# Patient Record
Sex: Female | Born: 1955 | Race: Black or African American | Hispanic: No | Smoking: Never smoker
Health system: Southern US, Community
[De-identification: ages and names within clinical notes are randomized; demographics above are authoritative.]

## PROBLEM LIST (undated history)

## (undated) DIAGNOSIS — E785 Hyperlipidemia, unspecified: Secondary | ICD-10-CM

## (undated) DIAGNOSIS — R51 Headache: Secondary | ICD-10-CM

## (undated) DIAGNOSIS — Z9989 Dependence on other enabling machines and devices: Secondary | ICD-10-CM

## (undated) DIAGNOSIS — R519 Headache, unspecified: Secondary | ICD-10-CM

## (undated) DIAGNOSIS — G4730 Sleep apnea, unspecified: Secondary | ICD-10-CM

## (undated) DIAGNOSIS — G4733 Obstructive sleep apnea (adult) (pediatric): Secondary | ICD-10-CM

## (undated) DIAGNOSIS — D473 Essential (hemorrhagic) thrombocythemia: Secondary | ICD-10-CM

## (undated) DIAGNOSIS — H409 Unspecified glaucoma: Secondary | ICD-10-CM

## (undated) DIAGNOSIS — I10 Essential (primary) hypertension: Secondary | ICD-10-CM

## (undated) DIAGNOSIS — R748 Abnormal levels of other serum enzymes: Secondary | ICD-10-CM

## (undated) DIAGNOSIS — K219 Gastro-esophageal reflux disease without esophagitis: Secondary | ICD-10-CM

## (undated) HISTORY — PX: ABDOMINAL HYSTERECTOMY: SHX81

## (undated) HISTORY — DX: Gastro-esophageal reflux disease without esophagitis: K21.9

## (undated) HISTORY — DX: Hyperlipidemia, unspecified: E78.5

## (undated) HISTORY — DX: Essential (hemorrhagic) thrombocythemia: D47.3

## (undated) HISTORY — DX: Sleep apnea, unspecified: G47.30

## (undated) HISTORY — DX: Abnormal levels of other serum enzymes: R74.8

## (undated) HISTORY — DX: Unspecified glaucoma: H40.9

## (undated) HISTORY — DX: Essential (primary) hypertension: I10

---

## 2000-12-12 ENCOUNTER — Other Ambulatory Visit: Admission: RE | Admit: 2000-12-12 | Discharge: 2000-12-12 | Payer: Self-pay | Admitting: Family Medicine

## 2001-08-28 ENCOUNTER — Encounter: Payer: Self-pay | Admitting: Family Medicine

## 2001-08-28 ENCOUNTER — Ambulatory Visit (HOSPITAL_COMMUNITY): Admission: RE | Admit: 2001-08-28 | Discharge: 2001-08-28 | Payer: Self-pay | Admitting: *Deleted

## 2001-11-06 ENCOUNTER — Other Ambulatory Visit: Admission: RE | Admit: 2001-11-06 | Discharge: 2001-11-06 | Payer: Self-pay | Admitting: General Surgery

## 2002-02-06 ENCOUNTER — Encounter: Payer: Self-pay | Admitting: Family Medicine

## 2002-02-06 ENCOUNTER — Ambulatory Visit (HOSPITAL_COMMUNITY): Admission: RE | Admit: 2002-02-06 | Discharge: 2002-02-06 | Payer: Self-pay | Admitting: Family Medicine

## 2002-09-03 ENCOUNTER — Ambulatory Visit (HOSPITAL_COMMUNITY): Admission: RE | Admit: 2002-09-03 | Discharge: 2002-09-03 | Payer: Self-pay | Admitting: Family Medicine

## 2002-09-03 ENCOUNTER — Encounter: Payer: Self-pay | Admitting: Family Medicine

## 2003-10-08 ENCOUNTER — Ambulatory Visit (HOSPITAL_COMMUNITY): Admission: RE | Admit: 2003-10-08 | Discharge: 2003-10-08 | Payer: Self-pay | Admitting: Family Medicine

## 2003-11-09 ENCOUNTER — Ambulatory Visit (HOSPITAL_COMMUNITY): Admission: RE | Admit: 2003-11-09 | Discharge: 2003-11-09 | Payer: Self-pay | Admitting: Family Medicine

## 2004-05-19 ENCOUNTER — Ambulatory Visit: Payer: Self-pay | Admitting: Family Medicine

## 2004-11-16 ENCOUNTER — Ambulatory Visit (HOSPITAL_COMMUNITY): Admission: RE | Admit: 2004-11-16 | Discharge: 2004-11-16 | Payer: Self-pay | Admitting: Family Medicine

## 2004-12-14 ENCOUNTER — Ambulatory Visit (HOSPITAL_COMMUNITY): Admission: RE | Admit: 2004-12-14 | Discharge: 2004-12-14 | Payer: Self-pay | Admitting: Family Medicine

## 2005-02-27 ENCOUNTER — Ambulatory Visit: Payer: Self-pay | Admitting: Family Medicine

## 2005-02-28 ENCOUNTER — Encounter: Payer: Self-pay | Admitting: Family Medicine

## 2005-02-28 ENCOUNTER — Ambulatory Visit (HOSPITAL_COMMUNITY): Admission: RE | Admit: 2005-02-28 | Discharge: 2005-02-28 | Payer: Self-pay | Admitting: Family Medicine

## 2005-03-06 ENCOUNTER — Ambulatory Visit: Payer: Self-pay | Admitting: Orthopedic Surgery

## 2005-03-08 ENCOUNTER — Ambulatory Visit (HOSPITAL_COMMUNITY): Admission: RE | Admit: 2005-03-08 | Discharge: 2005-03-08 | Payer: Self-pay | Admitting: Orthopedic Surgery

## 2005-03-08 ENCOUNTER — Encounter: Payer: Self-pay | Admitting: Family Medicine

## 2005-03-23 ENCOUNTER — Ambulatory Visit: Payer: Self-pay | Admitting: Orthopedic Surgery

## 2005-03-31 ENCOUNTER — Encounter (HOSPITAL_COMMUNITY): Admission: RE | Admit: 2005-03-31 | Discharge: 2005-04-30 | Payer: Self-pay | Admitting: Orthopedic Surgery

## 2005-06-02 ENCOUNTER — Ambulatory Visit: Payer: Self-pay | Admitting: Family Medicine

## 2005-08-24 ENCOUNTER — Ambulatory Visit: Payer: Self-pay | Admitting: Family Medicine

## 2005-12-25 ENCOUNTER — Ambulatory Visit (HOSPITAL_COMMUNITY): Admission: RE | Admit: 2005-12-25 | Discharge: 2005-12-25 | Payer: Self-pay | Admitting: Family Medicine

## 2006-02-27 ENCOUNTER — Ambulatory Visit: Payer: Self-pay | Admitting: Family Medicine

## 2006-03-15 ENCOUNTER — Other Ambulatory Visit: Admission: RE | Admit: 2006-03-15 | Discharge: 2006-03-15 | Payer: Self-pay | Admitting: Family Medicine

## 2006-03-15 ENCOUNTER — Ambulatory Visit: Payer: Self-pay | Admitting: Family Medicine

## 2006-03-15 ENCOUNTER — Encounter: Payer: Self-pay | Admitting: Family Medicine

## 2006-08-15 ENCOUNTER — Ambulatory Visit: Payer: Self-pay | Admitting: Family Medicine

## 2006-09-24 ENCOUNTER — Ambulatory Visit (HOSPITAL_COMMUNITY): Admission: RE | Admit: 2006-09-24 | Discharge: 2006-09-24 | Payer: Self-pay | Admitting: Family Medicine

## 2006-10-04 ENCOUNTER — Ambulatory Visit (HOSPITAL_COMMUNITY): Admission: RE | Admit: 2006-10-04 | Discharge: 2006-10-04 | Payer: Self-pay | Admitting: Gastroenterology

## 2006-10-04 ENCOUNTER — Ambulatory Visit: Payer: Self-pay | Admitting: Gastroenterology

## 2006-10-04 HISTORY — PX: COLONOSCOPY: SHX174

## 2006-10-09 ENCOUNTER — Encounter: Payer: Self-pay | Admitting: Family Medicine

## 2006-10-09 LAB — CONVERTED CEMR LAB
ALT: 14 units/L (ref 0–35)
AST: 19 units/L (ref 0–37)
Albumin: 4.5 g/dL (ref 3.5–5.2)
Alkaline Phosphatase: 142 units/L — ABNORMAL HIGH (ref 39–117)
CO2: 29 meq/L (ref 19–32)
Calcium: 10.1 mg/dL (ref 8.4–10.5)
Chloride: 104 meq/L (ref 96–112)
Cholesterol: 146 mg/dL (ref 0–200)
Creatinine, Ser: 0.79 mg/dL (ref 0.40–1.20)
Glucose, Bld: 89 mg/dL (ref 70–99)
HDL: 39 mg/dL — ABNORMAL LOW (ref >39)
Potassium: 4 meq/L (ref 3.5–5.3)
Sodium: 143 meq/L (ref 135–145)
Total CHOL/HDL Ratio: 3.7
Total Protein: 7.7 g/dL (ref 6.0–8.3)
Triglycerides: 67 mg/dL (ref <150)

## 2006-10-25 ENCOUNTER — Ambulatory Visit: Payer: Self-pay | Admitting: Family Medicine

## 2006-10-26 ENCOUNTER — Encounter: Payer: Self-pay | Admitting: Family Medicine

## 2006-10-26 LAB — CONVERTED CEMR LAB: Candida species: NEGATIVE

## 2006-10-27 ENCOUNTER — Encounter: Payer: Self-pay | Admitting: Family Medicine

## 2007-01-11 ENCOUNTER — Ambulatory Visit (HOSPITAL_COMMUNITY): Admission: RE | Admit: 2007-01-11 | Discharge: 2007-01-11 | Payer: Self-pay | Admitting: Family Medicine

## 2007-06-05 ENCOUNTER — Other Ambulatory Visit: Admission: RE | Admit: 2007-06-05 | Discharge: 2007-06-05 | Payer: Self-pay | Admitting: Family Medicine

## 2007-06-05 ENCOUNTER — Ambulatory Visit: Payer: Self-pay | Admitting: Family Medicine

## 2007-06-05 ENCOUNTER — Encounter: Payer: Self-pay | Admitting: Family Medicine

## 2007-06-05 LAB — CONVERTED CEMR LAB: Pap Smear: NORMAL

## 2007-06-14 ENCOUNTER — Encounter: Payer: Self-pay | Admitting: Family Medicine

## 2007-06-14 LAB — CONVERTED CEMR LAB
ALT: 9 units/L (ref 0–35)
AST: 12 units/L (ref 0–37)
Albumin: 4.4 g/dL (ref 3.5–5.2)
Bilirubin, Direct: 0.1 mg/dL (ref 0.0–0.3)
Chloride: 102 meq/L (ref 96–112)
Glucose, Bld: 92 mg/dL (ref 70–99)
Indirect Bilirubin: 0.5 mg/dL (ref 0.0–0.9)
LDL Cholesterol: 85 mg/dL (ref 0–99)
Potassium: 3.9 meq/L (ref 3.5–5.3)
Total Bilirubin: 0.6 mg/dL (ref 0.3–1.2)
Total Protein: 7.6 g/dL (ref 6.0–8.3)
VLDL: 19 mg/dL (ref 0–40)

## 2007-11-12 ENCOUNTER — Ambulatory Visit: Payer: Self-pay | Admitting: Family Medicine

## 2007-11-12 DIAGNOSIS — E785 Hyperlipidemia, unspecified: Secondary | ICD-10-CM | POA: Insufficient documentation

## 2007-11-12 DIAGNOSIS — I10 Essential (primary) hypertension: Secondary | ICD-10-CM | POA: Insufficient documentation

## 2008-01-24 ENCOUNTER — Ambulatory Visit (HOSPITAL_COMMUNITY): Admission: RE | Admit: 2008-01-24 | Discharge: 2008-01-24 | Payer: Self-pay | Admitting: Family Medicine

## 2008-05-04 ENCOUNTER — Encounter: Payer: Self-pay | Admitting: Family Medicine

## 2008-06-10 ENCOUNTER — Ambulatory Visit: Payer: Self-pay | Admitting: Family Medicine

## 2008-06-10 ENCOUNTER — Encounter: Payer: Self-pay | Admitting: Family Medicine

## 2008-06-10 ENCOUNTER — Other Ambulatory Visit: Admission: RE | Admit: 2008-06-10 | Discharge: 2008-06-10 | Payer: Self-pay | Admitting: Family Medicine

## 2008-06-10 DIAGNOSIS — R5381 Other malaise: Secondary | ICD-10-CM | POA: Insufficient documentation

## 2008-06-10 DIAGNOSIS — R5383 Other fatigue: Secondary | ICD-10-CM

## 2008-06-11 ENCOUNTER — Encounter: Payer: Self-pay | Admitting: Family Medicine

## 2008-06-11 LAB — CONVERTED CEMR LAB
ALT: 10 units/L (ref 0–35)
AST: 14 units/L (ref 0–37)
Alkaline Phosphatase: 135 units/L — ABNORMAL HIGH (ref 39–117)
BUN: 14 mg/dL (ref 6–23)
Basophils Relative: 0 % (ref 0–1)
Eosinophils Absolute: 0.1 10*3/uL (ref 0.0–0.7)
Hemoglobin: 11.9 g/dL — ABNORMAL LOW (ref 12.0–15.0)
Indirect Bilirubin: 0.5 mg/dL (ref 0.0–0.9)
LDL Cholesterol: 89 mg/dL (ref 0–99)
MCHC: 32.8 g/dL (ref 30.0–36.0)
MCV: 87.3 fL (ref 78.0–100.0)
Monocytes Relative: 7 % (ref 3–12)
Neutro Abs: 6.4 10*3/uL (ref 1.7–7.7)
Platelets: 401 10*3/uL — ABNORMAL HIGH (ref 150–400)
Potassium: 3.8 meq/L (ref 3.5–5.3)
RBC: 4.16 M/uL (ref 3.87–5.11)
Sodium: 143 meq/L (ref 135–145)
TSH: 1.36 microintl units/mL (ref 0.350–4.50)
Total Bilirubin: 0.7 mg/dL (ref 0.3–1.2)
Total CHOL/HDL Ratio: 3.6
Total Protein: 7.9 g/dL (ref 6.0–8.3)
WBC: 9.9 10*3/uL (ref 4.0–10.5)

## 2008-11-20 ENCOUNTER — Ambulatory Visit: Payer: Self-pay | Admitting: Family Medicine

## 2008-11-20 DIAGNOSIS — R209 Unspecified disturbances of skin sensation: Secondary | ICD-10-CM | POA: Insufficient documentation

## 2008-11-20 DIAGNOSIS — M25519 Pain in unspecified shoulder: Secondary | ICD-10-CM | POA: Insufficient documentation

## 2008-11-20 DIAGNOSIS — R51 Headache: Secondary | ICD-10-CM | POA: Insufficient documentation

## 2008-11-20 DIAGNOSIS — R519 Headache, unspecified: Secondary | ICD-10-CM | POA: Insufficient documentation

## 2009-01-11 ENCOUNTER — Encounter: Payer: Self-pay | Admitting: Family Medicine

## 2009-01-14 LAB — CONVERTED CEMR LAB
AST: 16 units/L (ref 0–37)
Alkaline Phosphatase: 134 units/L — ABNORMAL HIGH (ref 39–117)
Bilirubin, Direct: 0.1 mg/dL (ref 0.0–0.3)
CO2: 28 meq/L (ref 19–32)
Calcium: 9.8 mg/dL (ref 8.4–10.5)
Chloride: 102 meq/L (ref 96–112)
Cholesterol: 163 mg/dL (ref 0–200)
LDL Cholesterol: 107 mg/dL — ABNORMAL HIGH (ref 0–99)
Potassium: 4.1 meq/L (ref 3.5–5.3)
Sodium: 143 meq/L (ref 135–145)
Total Bilirubin: 0.6 mg/dL (ref 0.3–1.2)
Total CHOL/HDL Ratio: 4.2
Triglycerides: 83 mg/dL (ref <150)

## 2009-02-17 ENCOUNTER — Ambulatory Visit (HOSPITAL_COMMUNITY): Admission: RE | Admit: 2009-02-17 | Discharge: 2009-02-17 | Payer: Self-pay | Admitting: Family Medicine

## 2009-03-11 ENCOUNTER — Telehealth: Payer: Self-pay | Admitting: Family Medicine

## 2009-03-15 ENCOUNTER — Telehealth: Payer: Self-pay | Admitting: Family Medicine

## 2009-07-07 ENCOUNTER — Telehealth: Payer: Self-pay | Admitting: Family Medicine

## 2009-09-01 ENCOUNTER — Other Ambulatory Visit: Admission: RE | Admit: 2009-09-01 | Discharge: 2009-09-01 | Payer: Self-pay | Admitting: Family Medicine

## 2009-09-01 ENCOUNTER — Ambulatory Visit: Payer: Self-pay | Admitting: Family Medicine

## 2009-09-07 LAB — CONVERTED CEMR LAB: Pap Smear: NEGATIVE

## 2009-09-13 ENCOUNTER — Encounter: Payer: Self-pay | Admitting: Family Medicine

## 2009-09-13 LAB — CONVERTED CEMR LAB
BUN: 11 mg/dL (ref 6–23)
Calcium: 9.7 mg/dL (ref 8.4–10.5)
Cholesterol: 133 mg/dL (ref 0–200)
Creatinine, Ser: 0.74 mg/dL (ref 0.40–1.20)
Eosinophils Absolute: 0.1 10*3/uL (ref 0.0–0.7)
Glucose, Bld: 88 mg/dL (ref 70–99)
HDL: 40 mg/dL (ref >39)
Indirect Bilirubin: 0.5 mg/dL (ref 0.0–0.9)
Lymphocytes Relative: 29 % (ref 12–46)
Lymphs Abs: 2.7 10*3/uL (ref 0.7–4.0)
MCV: 90 fL (ref 78.0–100.0)
Monocytes Absolute: 0.6 10*3/uL (ref 0.1–1.0)
Neutrophils Relative %: 65 % (ref 43–77)
RDW: 13.8 % (ref 11.5–15.5)
Sodium: 140 meq/L (ref 135–145)
TSH: 1.358 microintl units/mL (ref 0.350–4.500)
Total Bilirubin: 0.6 mg/dL (ref 0.3–1.2)
Triglycerides: 74 mg/dL (ref <150)
Vit D, 25-Hydroxy: 25 ng/mL — ABNORMAL LOW (ref 30–89)

## 2010-03-10 ENCOUNTER — Ambulatory Visit (HOSPITAL_COMMUNITY): Admission: RE | Admit: 2010-03-10 | Discharge: 2010-03-10 | Payer: Self-pay | Admitting: Family Medicine

## 2010-04-05 ENCOUNTER — Telehealth: Payer: Self-pay | Admitting: Family Medicine

## 2010-04-20 ENCOUNTER — Ambulatory Visit: Payer: Self-pay | Admitting: Family Medicine

## 2010-04-20 DIAGNOSIS — J385 Laryngeal spasm: Secondary | ICD-10-CM | POA: Insufficient documentation

## 2010-04-20 DIAGNOSIS — K224 Dyskinesia of esophagus: Secondary | ICD-10-CM | POA: Insufficient documentation

## 2010-04-20 DIAGNOSIS — R1314 Dysphagia, pharyngoesophageal phase: Secondary | ICD-10-CM | POA: Insufficient documentation

## 2010-04-20 DIAGNOSIS — K219 Gastro-esophageal reflux disease without esophagitis: Secondary | ICD-10-CM | POA: Insufficient documentation

## 2010-04-20 DIAGNOSIS — R1310 Dysphagia, unspecified: Secondary | ICD-10-CM | POA: Insufficient documentation

## 2010-04-27 LAB — CONVERTED CEMR LAB
AST: 14 units/L (ref 0–37)
Albumin: 4.4 g/dL (ref 3.5–5.2)
BUN: 12 mg/dL (ref 6–23)
Calcium: 9.7 mg/dL (ref 8.4–10.5)
Creatinine, Ser: 0.82 mg/dL (ref 0.40–1.20)
Glucose, Bld: 92 mg/dL (ref 70–99)
HDL: 36 mg/dL — ABNORMAL LOW (ref >39)
Helicobacter Pylori Antibody-IgG: 2.4 — ABNORMAL HIGH
Indirect Bilirubin: 0.5 mg/dL (ref 0.0–0.9)
Potassium: 3.9 meq/L (ref 3.5–5.3)
Sodium: 140 meq/L (ref 135–145)
Total CHOL/HDL Ratio: 4.1
Triglycerides: 104 mg/dL (ref <150)

## 2010-04-28 ENCOUNTER — Encounter: Payer: Self-pay | Admitting: Family Medicine

## 2010-04-28 ENCOUNTER — Ambulatory Visit: Payer: Self-pay | Admitting: Otolaryngology

## 2010-05-18 ENCOUNTER — Ambulatory Visit
Admission: RE | Admit: 2010-05-18 | Discharge: 2010-05-18 | Payer: Self-pay | Source: Home / Self Care | Attending: Internal Medicine | Admitting: Internal Medicine

## 2010-05-19 ENCOUNTER — Ambulatory Visit (HOSPITAL_COMMUNITY)
Admission: RE | Admit: 2010-05-19 | Discharge: 2010-05-19 | Payer: Self-pay | Source: Home / Self Care | Attending: Family Medicine | Admitting: Family Medicine

## 2010-05-25 ENCOUNTER — Encounter: Payer: Self-pay | Admitting: Family Medicine

## 2010-05-26 ENCOUNTER — Encounter: Payer: Self-pay | Admitting: Family Medicine

## 2010-05-26 ENCOUNTER — Ambulatory Visit
Admission: RE | Admit: 2010-05-26 | Discharge: 2010-05-26 | Payer: Self-pay | Source: Home / Self Care | Attending: Otolaryngology | Admitting: Otolaryngology

## 2010-06-14 NOTE — Progress Notes (Signed)
Summary:  refill  Phone Note Call from Patient   Summary of Call: pt says that pharm has faxed [place] twice on her diovan. Can nurses please refill. [phone] Initial call taken by: Rudene Anda,  July 07, 2009 9:58 AM  Follow-up for Phone Call        Rx Called In Follow-up by: Adella Hare LPN,  July 07, 2009 10:03 AM    Prescriptions: DIOVAN HCT 320-25 MG  TABS (VALSARTAN-HYDROCHLOROTHIAZIDE) one tab by mouth once daily  #30 x 1   Entered by:   Adella Hare LPN   Authorized by:   Syliva Overman MD   Signed by:   Adella Hare LPN on 09/81/1914   Method used:   Electronically to        [facility] [phone]* (retail)       [address]       [place], [place]  [address]       Ph: [phone] or [phone]       Fax: [phone]   RxID:   [id number]

## 2010-06-14 NOTE — Assessment & Plan Note (Signed)
Summary: physical   Vital Signs:  Patient profile:   55 year old female Menstrual status:  hysterectomy Height:      63 inches Weight:      173.25 pounds BMI:     30.80 O2 Sat:      96 % Pulse rate:   85 / minute Pulse rhythm:   regular Resp:     16 per minute BP sitting:   124 / 84  (left arm) Cuff size:   regular  Vitals Entered By: Everitt Amber LPN (September 01, 2009 8:53 AM)  Nutrition Counseling: Patient's BMI is greater than 25 and therefore counseled on weight management options. CC: CPE  Vision Screening:Left eye w/o correction: 20 / 25 Right Eye w/o correction: 20 / 25 Both eyes w/o correction:  20/ 25  Color vision testing: normal     Vision Comments: could see better without glasses  Vision Entered By: Everitt Amber LPN (September 01, 2009 8:54 AM)   CC:  CPE.  History of Present Illness: Reports  that she has been  doing well. Denies recent fever or chills. Denies sinus pressure, nasal congestion , ear pain or sore throat. Denies chest congestion, or cough productive of sputum. Denies chest pain, palpitations, PND, orthopnea or leg swelling. Denies abdominal pain, nausea, vomitting, diarrhea or constipation. Denies change in bowel movements or bloody stool. Denies dysuria , frequency, incontinence or hesitancy. Denies  joint pain, swelling, or reduced mobility. Denies headaches, vertigo, seizures. Denies depression, anxiety or insomnia. Denies  rash, lesions, or itch.     Current Medications (verified): 1)  Klor-Con M20 20 Meq  Tbcr (Potassium Chloride Crys Cr) .... One Tab By Mouth Once Daily 2)  Diovan Hct 320-25 Mg  Tabs (Valsartan-Hydrochlorothiazide) .... One Tab By Mouth Once Daily 3)  Felodipine 10 Mg  Tb24 (Felodipine) .... One Tab By Mouth Once Daily 4)  Diltiazem Hcl Er Beads 420 Mg Xr24h-Cap (Diltiazem Hcl Er Beads) .... One Tab By Mouth Qd 5)  Vytorin 10-10 Mg Tabs (Ezetimibe-Simvastatin) .... Take 1 Tab By Mouth At Bedtime  Allergies  (verified): 1)  ! Flagyl  Past History:  Past medical, surgical, family and social histories (including risk factors) reviewed, and no changes noted (except as noted below).  Past Medical History: Reviewed history from 11/12/2007 and no changes required. Current Problems:  HYPERLIPIDEMIA (ICD-272.4) HYPERTENSION (ICD-401.9)  Past Surgical History: Reviewed history from 11/12/2007 and no changes required. Partial hysterectomy (1992)  Family History: Reviewed history from 06/10/2008 and no changes required. Mother living - HTN,breast ca Father living - HTN Two sisters living - x2 HTN One brother living - HTN Family History Breast cancer 1st degree relative over 64 , her mom diagnosed in 2010  Social History: Reviewed history from 11/12/2007 and no changes required. Employed Married One son Never Smoked Alcohol use-no Drug use-no  Review of Systems      See HPI Eyes:  Denies blurring, discharge, double vision, eye pain, and red eye. Endo:  Denies cold intolerance, excessive hunger, excessive thirst, excessive urination, heat intolerance, polyuria, and weight change. Heme:  Denies abnormal bruising and bleeding. Allergy:  Complains of seasonal allergies.  Physical Exam  General:  Well-developed,well-nourished,in no acute distress; alert,appropriate and cooperative throughout examination Head:  Normocephalic and atraumatic without obvious abnormalities. No apparent alopecia or balding. Eyes:  No corneal or conjunctival inflammation noted. EOMI. Perrla. Funduscopic exam benign, without hemorrhages, exudates or papilledema. Vision grossly normal. Ears:  External ear exam shows no significant lesions or deformities.  Otoscopic examination reveals clear canals, tympanic membranes are intact bilaterally without bulging, retraction, inflammation or discharge. Hearing is grossly normal bilaterally. Nose:  External nasal examination shows no deformity or inflammation. Nasal mucosa  are pink and moist without lesions or exudates. Mouth:  pharynx pink and moist and teeth missing.   Neck:  No deformities, masses, or tenderness noted. Chest Wall:  No deformities, masses, or tenderness noted. Breasts:  No mass, nodules, thickening, tenderness, bulging, retraction, inflamation, nipple discharge or skin changes noted.   Lungs:  Normal respiratory effort, chest expands symmetrically. Lungs are clear to auscultation, no crackles or wheezes. Heart:  Normal rate and regular rhythm. S1 and S2 normal without gallop, murmur, click, rub or other extra sounds. Abdomen:  Bowel sounds positive,abdomen soft and non-tender without masses, organomegaly or hernias noted. Rectal:  No external abnormalities noted. Normal sphincter tone. No rectal masses or tenderness. Genitalia:  normal introitus, no external lesions, no vaginal atrophy, and no adnexal masses or tenderness. Uterus is absent  Msk:  No deformity or scoliosis noted of thoracic or lumbar spine.   Pulses:  R and L carotid,radial,femoral,dorsalis pedis and posterior tibial pulses are full and equal bilaterally Extremities:  No clubbing, cyanosis, edema, or deformity noted with normal full range of motion of all joints.   Neurologic:  No cranial nerve deficits noted. Station and gait are normal. Plantar reflexes are down-going bilaterally. DTRs are symmetrical throughout. Sensory, motor and coordinative functions appear intact. Skin:  Intact without suspicious lesions or rashes Cervical Nodes:  No lymphadenopathy noted Axillary Nodes:  No palpable lymphadenopathy Inguinal Nodes:  No significant adenopathy Psych:  Cognition and judgment appear intact. Alert and cooperative with normal attention span and concentration. No apparent delusions, illusions, hallucinations   Impression & Recommendations:  Problem # 1:  SCREENING FOR MALIGNANT NEOPLASM OF THE CERVIX (ICD-V76.2) Assessment Comment Only  Orders: Pap Smear (29562)  Problem  # 2:  HYPERLIPIDEMIA (ICD-272.4) Assessment: Comment Only  Her updated medication list for this problem includes:    Vytorin 10-10 Mg Tabs (Ezetimibe-simvastatin) .Marland Kitchen... Take 1 tab by mouth at bedtime  Orders: T-Hepatic Function [phone]) T-Lipid Profile [phone])  Labs Reviewed: SGOT: 16 (01/14/2009)   SGPT: 13 (01/14/2009)   HDL:39 (01/14/2009), 40 (06/11/2008)  LDL:107 (01/14/2009), 89 (24/40/1027)  Chol:163 (01/14/2009), 145 (06/11/2008)  Trig:83 (01/14/2009), 80 (06/11/2008)  Problem # 3:  HYPERTENSION (ICD-401.9) Assessment: Unchanged  Her updated medication list for this problem includes:    Diovan Hct 320-25 Mg Tabs (Valsartan-hydrochlorothiazide) ..... One tab by mouth once daily    Felodipine 10 Mg Tb24 (Felodipine) ..... One tab by mouth once daily    Diltiazem Hcl Er Beads 420 Mg Xr24h-cap (Diltiazem hcl er beads) ..... One tab by mouth qd  Orders: T-Basic Metabolic Panel [phone])  BP today: 124/84 Prior BP: 120/80 (11/20/2008)  Labs Reviewed: K+: 4.1 (01/14/2009) Creat: : 0.78 (01/14/2009)   Chol: 163 (01/14/2009)   HDL: 39 (01/14/2009)   LDL: 107 (01/14/2009)   TG: 83 (01/14/2009)  Complete Medication List: 1)  Klor-con M20 20 Meq Tbcr (Potassium chloride crys cr) .... One tab by mouth once daily 2)  Diovan Hct 320-25 Mg Tabs (Valsartan-hydrochlorothiazide) .... One tab by mouth once daily 3)  Felodipine 10 Mg Tb24 (Felodipine) .... One tab by mouth once daily 4)  Diltiazem Hcl Er Beads 420 Mg Xr24h-cap (Diltiazem hcl er beads) .... One tab by mouth qd 5)  Vytorin 10-10 Mg Tabs (Ezetimibe-simvastatin) .... Take 1 tab by mouth at bedtime  Other  Orders: T-CBC w/Diff [phone]) T-TSH [phone]) T-Vitamin D (25-Hydroxy) [phone]) Hemoccult Guaiac-1 spec.(in office) [phone])  Patient Instructions: 1)  F/U in 5 .5 months 2)  It is important that you exercise regularly at least 20 minutes 5 times a week. If you develop chest pain, have severe  difficulty breathing, or feel very tired , stop exercising immediately and seek medical attention. 3)  You need to lose weight. Consider a lower calorie diet and regular exercise.  4)  BMP prior to visit, ICD-9: 5)  Hepatic Panel prior to visit, ICD-9: 6)  Lipid Panel prior to visit, ICD-9: 7)  TSH prior to visit, ICD-9: 8)  CBC w/ Diff prior to visit, ICD-9:vit d level asap Prescriptions: DIOVAN HCT 320-25 MG  TABS (VALSARTAN-HYDROCHLOROTHIAZIDE) one tab by mouth once daily  #30 x 4   Entered by:   Everitt Amber LPN   Authorized by:   Syliva Overman MD   Signed by:   Everitt Amber LPN on 87/56/4332   Method used:   Electronically to        [facility] [phone]* (retail)       [address]       [place], [place]  [address]       Ph: [phone] or [phone]       Fax: [phone]   RxID:   [id number]   Laboratory Results    Stool - Occult Blood Hemmoccult #1: negative Date: 09/01/2009 Comments: [id number] 9r 8/10 118 1012

## 2010-06-14 NOTE — Progress Notes (Signed)
Summary: medication  Phone Note Call from Patient   Summary of Call: patient request that you please send in medication she is out and she did make an appt. for Dec 7, thanks Initial call taken by: Curtis Sites,  April 05, 2010 1:51 PM    Prescriptions: KLOR-CON M20 20 MEQ  TBCR (POTASSIUM CHLORIDE CRYS CR) one tab by mouth once daily  #30 Tablet x 0   Entered by:   Adella Hare LPN   Authorized by:   Syliva Overman MD   Signed by:   Adella Hare LPN on 16/02/9603   Method used:   Electronically to        [facility] [phone]* (retail)       [address]       [place], [place]  [address]       Ph: [phone] or [phone]       Fax: [phone]   RxID:   [id number] FELODIPINE 10 MG  TB24 (FELODIPINE) one tab by mouth once daily  #30 Tablet x 0   Entered by:   Adella Hare LPN   Authorized by:   Syliva Overman MD   Signed by:   Adella Hare LPN on 74/25/9563   Method used:   Electronically to        [facility] [phone]* (retail)       [address]       [place], [place]  [address]       Ph: [phone] or [phone]       Fax: [phone]   RxID:   [id number] DIOVAN HCT 320-25 MG  TABS (VALSARTAN-HYDROCHLOROTHIAZIDE) one tab by mouth once daily  #30 Not Speci x 0   Entered by:   Adella Hare LPN   Authorized by:   Syliva Overman MD   Signed by:   Adella Hare LPN on 61/60/7371   Method used:   Electronically to        [facility] [phone]* (retail)       [address]       [place], [place]  [address]       Ph: [phone] or [phone]       Fax: [phone]   RxID:   [id number]

## 2010-06-14 NOTE — Letter (Signed)
Summary: Letter  Letter   Imported By: Lind Guest 09/13/2009 15:10:04  _____________________________________________________________________  External Attachment:    Type:   Image     Comment:   External Document

## 2010-06-16 NOTE — Assessment & Plan Note (Signed)
Summary: follow up /slj   Vital Signs:  Patient profile:   55 year old female Menstrual status:  hysterectomy Height:      63 inches Weight:      178.50 pounds BMI:     31.73 O2 Sat:      97 % on Room air Pulse rate:   84 / minute Pulse rhythm:   regular Resp:     16 per minute BP sitting:   146 / 80  (left arm)  Vitals Entered By: Adella Hare LPN (April 20, 2010 10:36 AM)  Nutrition Counseling: Patient's BMI is greater than 25 and therefore counseled on weight management options.  O2 Flow:  Room air CC: follow-up visit Is Patient Diabetic? No Pain Assessment Patient in pain? no        CC:  follow-up visit.  History of Present Illness: Pt states she has been doing badly in recent times. She is experiencingunprovoked episodes ofdifficulty swallowing and at times feels as though she cannot breathe also. Episodes are increasing in frequency and severity, they are unprovoked, accompanied by anxiety, but no classic panic symptoms. Otherwise she has no concerns  Current Medications (verified): 1)  Klor-Con M20 20 Meq  Tbcr (Potassium Chloride Crys Cr) .... One Tab By Mouth Once Daily 2)  Diovan Hct 320-25 Mg  Tabs (Valsartan-Hydrochlorothiazide) .... One Tab By Mouth Once Daily 3)  Felodipine 10 Mg  Tb24 (Felodipine) .... One Tab By Mouth Once Daily 4)  Diltiazem Hcl Er Beads 420 Mg Xr24h-Cap (Diltiazem Hcl Er Beads) .... One Tab By Mouth Qd 5)  Vytorin 10-10 Mg Tabs (Ezetimibe-Simvastatin) .... Take 1 Tab By Mouth At Bedtime 6)  Vitamin D 400 Unit Caps (Cholecalciferol) .... One Cap By Mouth Once Daily  Allergies (verified): 1)  ! Flagyl  Review of Systems      See HPI Eyes:  Denies discharge and red eye. ENT:  Denies hoarseness, nasal congestion, sinus pressure, and sore throat. CV:  Denies chest pain or discomfort, palpitations, and swelling of feet. Resp:  feels breath cutting off when she lies down at times since past 1 month, on avg once per week. GI:   difficulty swallowing liquids worse than solids x1 month getting strangled, breath cuts off, calls for help. GU:  Denies dysuria and urinary frequency. MS:  Denies joint pain and stiffness. Psych:  Complains of anxiety; denies depression, mental problems, suicidal thoughts/plans, thoughts of violence, and unusual visions or sounds; anxiety qassociated with feeling of choking and difficulty breathing. Endo:  Denies cold intolerance, excessive hunger, excessive thirst, and excessive urination. Heme:  Denies abnormal bruising and bleeding. Allergy:  Denies hives or rash and itching eyes.  Physical Exam  General:  Well-developed,well-nourished,in no acute distress; alert,appropriate and cooperative throughout examination HEENT: No facial asymmetry,  EOMI, No sinus tenderness, TM's Clear, oropharynx  pink and moist.   Chest: Clear to auscultation bilaterally.  CVS: S1, S2, No murmurs, No S3.   Abd: Soft, Nontender.  MS: Adequate ROM spine, hips, shoulders and knees.  Ext: No edema.   CNS: CN 2-12 intact, power tone and sensation normal throughout.   Skin: Intact, no visible lesions or rashes.  Psych: Good eye contact, normal affect.  Memory intact, mildly anxious not depressed appearing.    Impression & Recommendations:  Problem # 1:  GERD (ICD-530.81) Assessment Deteriorated  Her updated medication list for this problem includes:    Omeprazole 20 Mg Cpdr (Omeprazole) .Marland Kitchen... Take 1 capsule by mouth once a day  Prevpac Misc (Amoxicill-clarithro-lansopraz) ..... 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 One tab by mouth once daily    Felodipine 10 Mg Tb24 (Felodipine) ..... One tab by mouth once daily    Diltiazem Hcl Er Beads 420 Mg Xr24h-cap (Diltiazem hcl er beads) ..... One tab by mouth qd  Orders: T-Basic Metabolic Panel (11914-78295)  BP today: 146/80 Prior BP: 124/84 (09/01/2009)  Labs Reviewed: K+: 4.2 (09/08/2009) Creat: : 0.74 (09/08/2009)   Chol: 133 (09/08/2009)   HDL: 40 (09/08/2009)   LDL: 78 (09/08/2009)   TG: 74 (09/08/2009)  Problem # 5:  HYPERLIPIDEMIA (ICD-272.4) Assessment: Comment Only  Her updated medication list for this problem includes:    Vytorin 10-10 Mg Tabs (Ezetimibe-simvastatin) .Marland Kitchen... Take 1 tab by mouth at bedtime Low fat dietdiscussed and encouraged  Orders: T-Lipid Profile ([id number]) T-Hepatic Function [phone])  Labs Reviewed: SGOT: 13 (09/08/2009)   SGPT: 11 (09/08/2009)   HDL:40 (09/08/2009), 39 (01/14/2009)  LDL:78 (09/08/2009), 107 (13/24/4010)  Chol:133 (09/08/2009), 163 (01/14/2009)  Trig:74 (09/08/2009), 83 (01/14/2009)  Complete Medication List: 1)  Klor-con M20 20 Meq Tbcr (Potassium chloride crys cr) .... One tab by mouth once daily 2)  Diovan Hct 320-25 Mg Tabs (Valsartan-hydrochlorothiazide) .... One tab by mouth once daily 3)  Felodipine 10 Mg Tb24 (Felodipine) .... One tab by mouth once daily 4)  Diltiazem Hcl Er Beads 420 Mg Xr24h-cap (Diltiazem hcl er beads) .... One tab by mouth qd 5)  Vytorin 10-10 Mg Tabs (Ezetimibe-simvastatin) .... Take 1 tab by mouth at bedtime 6)  Vitamin D 400 Unit Caps (Cholecalciferol) .... One cap by mouth once daily 7)  Omeprazole 20 Mg Cpdr (Omeprazole) .... Take 1 capsule by mouth once a day 8)  Prevpac Misc (Amoxicill-clarithro-lansopraz) .... Use as directed  Other Orders: TLB-H. Pylori Abs(Helicobacter Pylori) (86677-HELICO)  Patient Instructions: 1)  CPE end April/May 2)  BMP prior to visit, ICD-9: 3)   Hepatic Panel prior to visit, ICD-9: 4)  Lipid Panel prior to visit, ICD-9:  fasting asap 5)  H pyllori 6)  New med for reflux, and referrals x 3 as discussed Prescriptions: PREVPAC  MISC (AMOXICILL-CLARITHRO-LANSOPRAZ) Use as directed  #1 x 0   Entered and Authorized by:   Syliva Overman MD   Signed by:   Syliva Overman MD on 04/25/2010   Method used:   Historical   RxID:   [id number] FELODIPINE 10 MG  TB24 (FELODIPINE) one tab by mouth once daily  #30 Tablet x 5   Entered by:   Adella Hare LPN   Authorized by:   Syliva Overman MD   Signed by:   Adella Hare LPN on 95/63/8756   Method used:   Electronically to        [facility] [phone]* (retail)       [address]       [place], [place]  [address]       Ph: [phone] or [phone]       Fax: [phone]   RxID:   [id number] DIOVAN HCT 320-25 MG  TABS (VALSARTAN-HYDROCHLOROTHIAZIDE)  one tab by mouth once daily  #30 Not Speci x 5   Entered by:   Adella Hare LPN   Authorized by:   Syliva Overman MD   Signed by:   Adella Hare LPN on 66/44/0347   Method used:   Electronically to        [facility] [phone]* (retail)       [address]       [place], [place]  [address]       Ph: [phone] or [phone]       Fax: [phone]   RxID:   [id number] KLOR-CON M20 20 MEQ  TBCR (POTASSIUM CHLORIDE CRYS CR) one tab by mouth once daily  #30 Tablet x 5   Entered by:   Adella Hare LPN   Authorized by:   Syliva Overman MD   Signed by:   Adella Hare LPN on 28/31/5176   Method used:   Electronically to        [facility] [phone]* (retail)       [address]       [place], [place]  [address]       Ph: [phone] or [phone]       Fax: [phone]   RxID:   [id number] OMEPRAZOLE 20 MG CPDR (OMEPRAZOLE) Take 1 capsule by mouth once a day  #30 x 4   Entered and Authorized by:   Syliva Overman MD   Signed by:   Syliva Overman MD on  04/20/2010   Method used:   Electronically to        [facility] [phone]* (retail)       [address]       [place], [place]  [address]       Ph: [phone] or [phone]       Fax: [phone]   RxID:   [id number]    Orders Added: 1)  Est. Patient Level IV [19379] 2)  Est. Patient Level IV [02409] 3)  T-Basic Metabolic Panel [80048-22910] 4)  T-Lipid Profile [80061-22930] 5)  T-Hepatic Function [80076-22960] 6)  TLB-H. Pylori Abs(Helicobacter Pylori) [86677-HELICO] 7)  Radiology Referral [Radiology] 8)  Gastroenterology Referral [GI] 9)  ENT Referral [ENT]

## 2010-06-16 NOTE — Miscellaneous (Signed)
Summary: Rehab Report  Rehab Report   Imported By: Lind Guest 05/25/2010 09:15:10  _____________________________________________________________________  External Attachment:    Type:   Image     Comment:   External Document

## 2010-06-16 NOTE — Letter (Signed)
Summary: ent  ent   Imported By: Lind Guest 05/02/2010 15:38:00  _____________________________________________________________________  External Attachment:    Type:   Image     Comment:   External Document

## 2010-06-16 NOTE — Letter (Signed)
Summary: ent  ent   Imported By: Lind Guest 06/01/2010 10:33:43  _____________________________________________________________________  External Attachment:    Type:   Image     Comment:   External Document

## 2010-09-30 NOTE — Op Note (Signed)
NAME:  KEELY, DRENNAN              ACCOUNT NO.:  [id number]   MEDICAL RECORD NO.:  [id number]          PATIENT TYPE:  AMB   LOCATION:  DAY                           FACILITY:  [facility]   PHYSICIAN:  Kassie Mends, M.D.      DATE OF BIRTH:  [birth year]   DATE OF PROCEDURE:  10/04/2006  DATE OF DISCHARGE:                               OPERATIVE REPORT   REFERRING PHYSICIAN:  Milus Mallick. Lodema Hong, M.D.   PROCEDURE:  Colonoscopy.   INDICATIONS FOR EXAMINATION:  Ms. Klos is a 55 year old female who  presents for colon cancer screening.   FINDINGS:  1. Normal colon without evidence of polyps, masses, inflammatory      changes, diverticula or arteriovenous malformations.  2. Normal retroflexed view of the rectum.   RECOMMENDATIONS:  Screening colonoscopy in 10 years.   MEDICATIONS:  1. Demerol 75 mg IV.  2. Versed 5 mg IV.   TECHNIQUE:  Physical exam was performed.  Informed consent was obtained  from the patient, explaining benefits, risks and alternatives to the  procedure.  The patient was connected to the monitor and placed in the  left lateral position.  After administration of sedation and rectal  exam, the patient's rectum was intubated and the scope was advanced  under direct visualization to the cecum.  The scope was subsequently  removed slowly by carefully examining the color, texture, anatomy, and  integrity of the mucosa on the way out.  The patient was recovered in  endoscopy and discharged home in satisfactory condition.      Kassie Mends, M.D.  Electronically Signed     SM/MEDQ  D:  10/04/2006  T:  10/04/2006  Job:  [id number]   cc:   Milus Mallick. Lodema Hong, M.D.  Fax: [phone]

## 2010-10-06 ENCOUNTER — Other Ambulatory Visit: Payer: Self-pay | Admitting: Family Medicine

## 2010-10-21 ENCOUNTER — Encounter: Payer: Self-pay | Admitting: Family Medicine

## 2010-10-26 ENCOUNTER — Ambulatory Visit (INDEPENDENT_AMBULATORY_CARE_PROVIDER_SITE_OTHER): Payer: 59 | Admitting: Family Medicine

## 2010-10-26 ENCOUNTER — Other Ambulatory Visit (HOSPITAL_COMMUNITY)
Admission: RE | Admit: 2010-10-26 | Discharge: 2010-10-26 | Disposition: A | Discharge status: Home / Self Care | Payer: PRIVATE HEALTH INSURANCE | Source: Ambulatory Visit | Attending: Family Medicine | Admitting: Family Medicine

## 2010-10-26 ENCOUNTER — Encounter: Payer: Self-pay | Admitting: Family Medicine

## 2010-10-26 DIAGNOSIS — R7309 Other abnormal glucose: Secondary | ICD-10-CM

## 2010-10-26 DIAGNOSIS — R7302 Impaired glucose tolerance (oral): Secondary | ICD-10-CM

## 2010-10-26 DIAGNOSIS — R5381 Other malaise: Secondary | ICD-10-CM

## 2010-10-26 DIAGNOSIS — R5383 Other fatigue: Secondary | ICD-10-CM

## 2010-10-26 DIAGNOSIS — Z1211 Encounter for screening for malignant neoplasm of colon: Secondary | ICD-10-CM

## 2010-10-26 DIAGNOSIS — I10 Essential (primary) hypertension: Secondary | ICD-10-CM

## 2010-10-26 DIAGNOSIS — Z01419 Encounter for gynecological examination (general) (routine) without abnormal findings: Secondary | ICD-10-CM | POA: Insufficient documentation

## 2010-10-26 DIAGNOSIS — K219 Gastro-esophageal reflux disease without esophagitis: Secondary | ICD-10-CM

## 2010-10-26 DIAGNOSIS — Z124 Encounter for screening for malignant neoplasm of cervix: Secondary | ICD-10-CM

## 2010-10-26 DIAGNOSIS — E785 Hyperlipidemia, unspecified: Secondary | ICD-10-CM

## 2010-10-26 LAB — POC HEMOCCULT BLD/STL (OFFICE/1-CARD/DIAGNOSTIC): Fecal Occult Blood, POC: NEGATIVE

## 2010-10-26 MED ORDER — VALSARTAN-HYDROCHLOROTHIAZIDE 320-25 MG PO TABS: 1.0000 | ORAL_TABLET | Freq: Every day | ORAL | Status: DC

## 2010-10-26 NOTE — Patient Instructions (Addendum)
F/u in early December.  HBA1C , fasting lipid, hepatic and chem 7 asap.  Fasting lipid, hepatic and chem 7 in Dec, also tsh and cBC    You need an annual eye exam by a specialist since your Dad has glaucoma.  Pls start regular exercise at least 3 times per week, this will only make you more healthy!!!  .A healthy diet is rich in fruit, vegetables and whole grains. Poultry fish, nuts and beans are a healthy choice for protein rather then red meat. A low sodium diet and drinking 64 ounces of water daily is generally recommended. Oils and sweet should be limited. Carbohydrates especially for those who are diabetic or overweight, should be limited to 34-45 gram per meal. It is important to eat on a regular schedule, at least 3 times daily. Snacks should be primarily fruits, vegetables or nuts.

## 2010-10-26 NOTE — Progress Notes (Signed)
Subjective:    Patient ID: Kristin Hall, female    DOB: 1955/08/31, 55 y.o.   MRN: [id number]  HPI The PT is here for annual exam and re-evaluation of chronic medical conditions, medication management and review of recent lab and radiology data.  Preventive health is updated, specifically  Cancer screening,  and Immunization.   Questions or concerns regarding consultations or procedures which the PT has had in the interim are  addressed. The PT denies any adverse reactions to current medications since the last visit.  There are no new concerns.  There are no specific complaints . She still does not exercise on any routine basis. Her father has glaucoma and her last eye exam is over 2 years ago, I advised she needs one annualy      Review of Systems Denies recent fever or chills. Denies sinus pressure, nasal congestion, ear pain or sore throat. Denies chest congestion, productive cough or wheezing. Denies chest pains, palpitations, paroxysmal nocturnal dyspnea, orthopnea and leg swelling Denies abdominal pain, nausea, vomiting,diarrhea or constipation.  Denies rectal bleeding or change in bowel movement. Denies dysuria, frequency, hesitancy or incontinence. Denies joint pain, swelling and limitation in mobility. Denies headaches, seizure, numbness, or tingling. Denies depression, anxiety or insomnia. Denies skin break down or rash.        Objective:   Physical Exam Pleasant well nourished female, alert and oriented x 3, in no cardio-pulmonary distress. Afebrile. HEENT No facial trauma or asymetry.   EOMI, PERTL, fundoscopic exam is normal, no hemorhage or exudate. No papiledema External ears normal, tympanic membranes clear. Oropharynx moist, no exudate, good dentition. Neck: supple, no adenopathy,JVD or thyromegaly.No bruits.  Chest: Clear to ascultation bilaterally.No crackles or wheezes. Non tender to palpation  Breast: No asymetry,no masses. No nipple discharge  or inversion. No axillary or supraclavicular adenopathy  Cardiovascular system; Heart sounds normal,  S1 and  S2 ,no S3.  No murmur, or thrill. Apical beat not displaced Peripheral pulses normal.  Abdomen: Soft, non tender, no organomegaly or masses. No bruits. Bowel sounds normal. No guarding, tenderness or rebound.  Rectal:  No mass. guaic negative stool.  GU: External genitalia normal. No lesions. Vaginal canal normal.No discharge. Uterus normal size, no adnexal masses, no cervical motion or adnexal tenderness.  Musculoskeletal exam: Full ROM of spine, hips , shoulders and knees. No deformity ,swelling or crepitus noted. No muscle wasting or atrophy.   Neurologic: Cranial nerves 2 to 12 intact. Power, tone ,sensation and reflexes normal throughout. No disturbance in gait. No tremor.  Skin: Intact, no ulceration, erythema , scaling or rash noted. Pigmentation normal throughout  Psych; Normal mood and affect. Judgement and concentration normal        Assessment & Plan:

## 2010-10-26 NOTE — Assessment & Plan Note (Signed)
Uncontrolled at this visit, pt has not taken her med on time today

## 2010-10-26 NOTE — Assessment & Plan Note (Signed)
Low fat , Dash diet discussed , rept lipids

## 2010-10-26 NOTE — Assessment & Plan Note (Signed)
Symptoms have markedly improved

## 2010-11-19 LAB — BASIC METABOLIC PANEL
Glucose, Bld: 91 mg/dL (ref 70–99)
Potassium: 3.6 mEq/L (ref 3.5–5.3)
Sodium: 141 mEq/L (ref 135–145)

## 2010-11-19 LAB — HEPATIC FUNCTION PANEL
Albumin: 4.3 g/dL (ref 3.5–5.2)
Bilirubin, Direct: 0.1 mg/dL (ref 0.0–0.3)

## 2010-11-19 LAB — HEMOGLOBIN A1C: Hgb A1c MFr Bld: 5.8 % — ABNORMAL HIGH (ref <5.7)

## 2010-11-19 LAB — LIPID PANEL
LDL Cholesterol: 94 mg/dL (ref 0–99)
Triglycerides: 51 mg/dL (ref <150)

## 2010-11-28 ENCOUNTER — Encounter: Payer: Self-pay | Admitting: *Deleted

## 2010-11-28 ENCOUNTER — Encounter: Payer: Self-pay | Admitting: Family Medicine

## 2010-11-28 ENCOUNTER — Ambulatory Visit (INDEPENDENT_AMBULATORY_CARE_PROVIDER_SITE_OTHER): Payer: PRIVATE HEALTH INSURANCE | Admitting: Family Medicine

## 2010-11-28 VITALS — BP 120/70 | HR 120 | Temp 98.1°F | Resp 16 | Ht 63.0 in | Wt 163.1 lb

## 2010-11-28 DIAGNOSIS — E785 Hyperlipidemia, unspecified: Secondary | ICD-10-CM

## 2010-11-28 DIAGNOSIS — D72829 Elevated white blood cell count, unspecified: Secondary | ICD-10-CM

## 2010-11-28 DIAGNOSIS — J029 Acute pharyngitis, unspecified: Secondary | ICD-10-CM

## 2010-11-28 DIAGNOSIS — J370 Chronic laryngitis: Secondary | ICD-10-CM

## 2010-11-28 DIAGNOSIS — I10 Essential (primary) hypertension: Secondary | ICD-10-CM

## 2010-11-28 DIAGNOSIS — R1110 Vomiting, unspecified: Secondary | ICD-10-CM

## 2010-11-28 LAB — POCT RAPID STREP A (OFFICE): Rapid Strep A Screen: NEGATIVE

## 2010-11-28 MED ORDER — PENICILLIN V POTASSIUM 250 MG/5ML PO SOLR: ORAL | Status: DC

## 2010-11-28 MED ORDER — CEFTRIAXONE SODIUM 500 MG IJ SOLR
500.0000 mg | Freq: Once | INTRAMUSCULAR | Status: AC
  Administered 2010-11-28: 500 mg via INTRAMUSCULAR

## 2010-11-28 MED ORDER — FIRST-DUKES MOUTHWASH MT SUSP: OROMUCOSAL | Status: DC

## 2010-11-28 NOTE — Progress Notes (Signed)
Subjective:    Patient ID: Kristin Hall, female    DOB: Apr 23, 1956, 55 y.o.   MRN: [id number]  HPI 3 day h/o sore throat, body aches, chills and fever, undocumented. Feels as though she will vomit when she does cough.stool has been loose and she has generalized soreness on right side of abdomen. No other known sick contact, generalized malaise   Review of Systems  Denies sinus pressure, nasal congestion, ear pain  Denies chest congestion, productive cough or wheezing. Denies chest pains, palpitations,  and leg swelling Denies diarrhea or constipation.  Denies rectal bleeding or change in bowel movement. Denies dysuria, frequency, hesitancy or incontinence. C/o generalized muscle and joint pain.  Denies  seizure, numbness, or tingling.c/o fromtal headache extending to crown of head Denies depression, anxiety or insomnia. Denies skin break down or rash.        Objective:   Physical Exam Patient alert and oriented and in no Cardiopulmonary distress.Ill appearing  HEENT: No facial asymmetry, EOMI, no sinus tenderness, TM's clear, Oropharynx erythematous with purulent exudate in pockets  Neck supple anterior cervical adenopathy bilaterally.Halitosis  Chest: Clear to auscultation bilaterally.  CVS: S1, S2 no murmurs, no S3.  ABD: Soft  Superficial diffuse tenderness. Bowel sounds normal.no guarding or rebound  Ext: No edema  MS: Adequate ROM spine, shoulders, hips and knees.  Skin: Intact, no ulcerations or rash noted.        Assessment & Plan:

## 2010-11-28 NOTE — Patient Instructions (Addendum)
F/u as before.  You are being treated for  Acute pharyngitis, rocephin will be administered in the office and med is also sent in.  Work excuse today to return 12/01/2010  Labs today  Cbc, chem 7 , amylase, lipase and hepatic panel, not stat  Pls follow a bRAT diet, we will provide this.  Call if worse

## 2010-11-29 ENCOUNTER — Ambulatory Visit (INDEPENDENT_AMBULATORY_CARE_PROVIDER_SITE_OTHER): Payer: PRIVATE HEALTH INSURANCE | Admitting: Family Medicine

## 2010-11-29 VITALS — BP 120/80 | Wt 163.1 lb

## 2010-11-29 DIAGNOSIS — D72829 Elevated white blood cell count, unspecified: Secondary | ICD-10-CM

## 2010-11-29 LAB — CBC WITH DIFFERENTIAL/PLATELET
Basophils Absolute: 0 10*3/uL (ref 0.0–0.1)
Lymphocytes Relative: 11 % — ABNORMAL LOW (ref 12–46)
Neutro Abs: 18.5 10*3/uL — ABNORMAL HIGH (ref 1.7–7.7)
Neutrophils Relative %: 83 % — ABNORMAL HIGH (ref 43–77)
Platelets: 438 10*3/uL — ABNORMAL HIGH (ref 150–400)
RDW: 13.9 % (ref 11.5–15.5)
WBC: 22.4 10*3/uL — ABNORMAL HIGH (ref 4.0–10.5)

## 2010-11-29 LAB — HEPATIC FUNCTION PANEL
ALT: 12 U/L (ref 0–35)
AST: 22 U/L (ref 0–37)
Alkaline Phosphatase: 138 U/L — ABNORMAL HIGH (ref 39–117)
Bilirubin, Direct: 0.2 mg/dL (ref 0.0–0.3)
Total Bilirubin: 1 mg/dL (ref 0.3–1.2)

## 2010-11-29 LAB — BASIC METABOLIC PANEL
CO2: 25 mEq/L (ref 19–32)
Calcium: 10.1 mg/dL (ref 8.4–10.5)
Chloride: 101 mEq/L (ref 96–112)
Creat: 1.32 mg/dL — ABNORMAL HIGH (ref 0.50–1.10)
Glucose, Bld: 95 mg/dL (ref 70–99)
Sodium: 142 mEq/L (ref 135–145)

## 2010-11-29 MED ORDER — CEFTRIAXONE SODIUM 500 MG IJ SOLR
500.0000 mg | Freq: Once | INTRAMUSCULAR | Status: AC
  Administered 2010-11-29: 500 mg via INTRAMUSCULAR

## 2010-11-29 MED ORDER — PENICILLIN V POTASSIUM 500 MG PO TABS: 500.0000 mg | ORAL_TABLET | Freq: Four times a day (QID) | ORAL | Status: AC

## 2010-11-29 NOTE — Progress Notes (Signed)
Patient given injection with no complications. Also gave stat lab to be done in the am and extended work note. Patient understands we will call her later in the morning with the result

## 2010-11-30 LAB — CBC WITH DIFFERENTIAL/PLATELET
Basophils Absolute: 0 10*3/uL (ref 0.0–0.1)
Basophils Relative: 0 % (ref 0–1)
Eosinophils Absolute: 0.1 10*3/uL (ref 0.0–0.7)
Eosinophils Relative: 1 % (ref 0–5)
HCT: 38.2 % (ref 36.0–46.0)
Hemoglobin: 12.4 g/dL (ref 12.0–15.0)
MCH: 28.4 pg (ref 26.0–34.0)
MCHC: 32.5 g/dL (ref 30.0–36.0)
Monocytes Absolute: 0.8 10*3/uL (ref 0.1–1.0)
Monocytes Relative: 7 % (ref 3–12)
RDW: 13.4 % (ref 11.5–15.5)

## 2010-12-04 NOTE — Assessment & Plan Note (Signed)
Controlled, no change in medication  

## 2010-12-04 NOTE — Assessment & Plan Note (Signed)
Rapid strep positive, rocephin administered, work excuse written, cbc check

## 2010-12-25 ENCOUNTER — Other Ambulatory Visit: Payer: Self-pay | Admitting: Family Medicine

## 2011-02-03 ENCOUNTER — Other Ambulatory Visit: Payer: Self-pay | Admitting: Family Medicine

## 2011-02-12 ENCOUNTER — Other Ambulatory Visit: Payer: Self-pay | Admitting: *Deleted

## 2011-02-12 MED ORDER — POTASSIUM CHLORIDE CRYS ER 20 MEQ PO TBCR: 20.0000 meq | EXTENDED_RELEASE_TABLET | Freq: Every day | ORAL | Status: DC

## 2011-02-12 MED ORDER — DILTIAZEM HCL ER BEADS 420 MG PO CP24: 420.0000 mg | ORAL_CAPSULE | Freq: Every day | ORAL | Status: DC

## 2011-02-12 MED ORDER — FELODIPINE ER 10 MG PO TB24: 10.0000 mg | ORAL_TABLET | Freq: Every day | ORAL | Status: DC

## 2011-02-12 MED ORDER — VALSARTAN-HYDROCHLOROTHIAZIDE 320-25 MG PO TABS: 1.0000 | ORAL_TABLET | Freq: Every day | ORAL | Status: DC

## 2011-02-28 ENCOUNTER — Other Ambulatory Visit: Payer: Self-pay | Admitting: Family Medicine

## 2011-02-28 DIAGNOSIS — Z139 Encounter for screening, unspecified: Secondary | ICD-10-CM

## 2011-03-24 ENCOUNTER — Ambulatory Visit (HOSPITAL_COMMUNITY)
Admission: RE | Admit: 2011-03-24 | Discharge: 2011-03-24 | Disposition: A | Discharge status: Home / Self Care | Payer: 59 | Source: Ambulatory Visit | Attending: Family Medicine | Admitting: Family Medicine

## 2011-03-24 DIAGNOSIS — Z1231 Encounter for screening mammogram for malignant neoplasm of breast: Secondary | ICD-10-CM | POA: Insufficient documentation

## 2011-03-24 DIAGNOSIS — Z139 Encounter for screening, unspecified: Secondary | ICD-10-CM

## 2011-04-18 LAB — CBC WITH DIFFERENTIAL/PLATELET
Basophils Absolute: 0 10*3/uL (ref 0.0–0.1)
Basophils Relative: 0 % (ref 0–1)
Hemoglobin: 12.3 g/dL (ref 12.0–15.0)
Lymphocytes Relative: 26 % (ref 12–46)
MCHC: 34.6 g/dL (ref 30.0–36.0)
Neutro Abs: 4.6 10*3/uL (ref 1.7–7.7)
Neutrophils Relative %: 68 % (ref 43–77)
RDW: 13.5 % (ref 11.5–15.5)
WBC: 6.8 10*3/uL (ref 4.0–10.5)

## 2011-04-19 LAB — LIPID PANEL
Cholesterol: 154 mg/dL (ref 0–200)
Total CHOL/HDL Ratio: 3.5 Ratio
Triglycerides: 67 mg/dL (ref <150)
VLDL: 13 mg/dL (ref 0–40)

## 2011-04-19 LAB — BASIC METABOLIC PANEL
BUN: 13 mg/dL (ref 6–23)
Calcium: 9.5 mg/dL (ref 8.4–10.5)
Glucose, Bld: 84 mg/dL (ref 70–99)
Sodium: 141 mEq/L (ref 135–145)

## 2011-04-19 LAB — HEPATIC FUNCTION PANEL
ALT: 16 U/L (ref 0–35)
AST: 22 U/L (ref 0–37)
Bilirubin, Direct: 0.1 mg/dL (ref 0.0–0.3)
Total Protein: 7.8 g/dL (ref 6.0–8.3)

## 2011-04-19 LAB — TSH: TSH: 1.582 u[IU]/mL (ref 0.350–4.500)

## 2011-04-21 ENCOUNTER — Encounter: Payer: Self-pay | Admitting: Family Medicine

## 2011-04-27 ENCOUNTER — Encounter: Payer: Self-pay | Admitting: Family Medicine

## 2011-04-27 ENCOUNTER — Ambulatory Visit (INDEPENDENT_AMBULATORY_CARE_PROVIDER_SITE_OTHER): Payer: PRIVATE HEALTH INSURANCE | Admitting: Family Medicine

## 2011-04-27 VITALS — BP 104/70 | HR 65 | Resp 18 | Ht 63.0 in | Wt 173.0 lb

## 2011-04-27 DIAGNOSIS — J309 Allergic rhinitis, unspecified: Secondary | ICD-10-CM

## 2011-04-27 DIAGNOSIS — E785 Hyperlipidemia, unspecified: Secondary | ICD-10-CM

## 2011-04-27 DIAGNOSIS — I10 Essential (primary) hypertension: Secondary | ICD-10-CM

## 2011-04-27 MED ORDER — DILTIAZEM HCL ER BEADS 420 MG PO CP24: 420.0000 mg | ORAL_CAPSULE | Freq: Every day | ORAL | Status: DC

## 2011-04-27 MED ORDER — POTASSIUM CHLORIDE CRYS ER 20 MEQ PO TBCR: 20.0000 meq | EXTENDED_RELEASE_TABLET | Freq: Every day | ORAL | Status: DC

## 2011-04-27 MED ORDER — FELODIPINE ER 10 MG PO TB24: 10.0000 mg | ORAL_TABLET | Freq: Every day | ORAL | Status: DC

## 2011-04-27 MED ORDER — VALSARTAN-HYDROCHLOROTHIAZIDE 320-25 MG PO TABS: 1.0000 | ORAL_TABLET | Freq: Every day | ORAL | Status: DC

## 2011-04-27 NOTE — Patient Instructions (Signed)
CPE in October  Fasting lipid, hepatic and chem 7 in middle of May  No med changes at this time  It is important that you exercise regularly at least 30 minutes 5 times a week. If you develop chest pain, have severe difficulty breathing, or feel very tired, stop exercising immediately and seek medical attention    A healthy diet is rich in fruit, vegetables and whole grains. Poultry fish, nuts and beans are a healthy choice for protein rather then red meat. A low sodium diet and drinking 64 ounces of water daily is generally recommended. Oils and sweet should be limited. Carbohydrates especially for those who are diabetic or overweight, should be limited to 30-45 gram per meal. It is important to eat on a regular schedule, at least 3 times daily. Snacks should be primarily fruits, vegetables or nuts.

## 2011-04-27 NOTE — Progress Notes (Signed)
Subjective:    Patient ID: Kristin Hall, female    DOB: [birth year], 55 y.o.   MRN: [id number]  HPI  The PT is here for follow up and re-evaluation of chronic medical conditions, medication management and review of any available recent lab and radiology data.  Preventive health is updated, specifically  Cancer screening and Immunization.   Questions or concerns regarding consultations or procedures which the PT has had in the interim are  addressed. The PT denies any adverse reactions to current medications since the last visit.  There are no new concerns.  There are no specific complaints  Still no commitment to regular exercise unfortunately, hopefully will change this     Review of Systems See HPI Denies recent fever or chills. Denies sinus pressure, nasal congestion, ear pain or sore throat. Denies chest congestion, productive cough or wheezing. Denies chest pains, palpitations and leg swelling Denies abdominal pain, nausea, vomiting,diarrhea or constipation.   Denies dysuria, frequency, hesitancy or incontinence. Denies joint pain, swelling and limitation in mobility. Denies headaches, seizures, numbness, or tingling. Denies depression, anxiety or insomnia. Denies skin break down or rash.        Objective:   Physical Exam Patient alert and oriented and in no cardiopulmonary distress.  HEENT: No facial asymmetry, EOMI, no sinus tenderness,  oropharynx pink and moist.  Neck supple no adenopathy.  Chest: Clear to auscultation bilaterally.  CVS: S1, S2 no murmurs, no S3.  ABD: Soft non tender. Bowel sounds normal.  Ext: No edema  MS: Adequate ROM spine, shoulders, hips and knees.  Skin: Intact, no ulcerations or rash noted.  Psych: Good eye contact, normal affect. Memory intact not anxious or depressed appearing.  CNS: CN 2-12 intact, power, tone and sensation normal throughout.        Assessment & Plan:

## 2011-04-30 DIAGNOSIS — J309 Allergic rhinitis, unspecified: Secondary | ICD-10-CM | POA: Insufficient documentation

## 2011-04-30 NOTE — Assessment & Plan Note (Signed)
Controlled, no change in medication Low fat diet discussed and encouraged 

## 2011-04-30 NOTE — Assessment & Plan Note (Signed)
Controlled on medication as needed 

## 2011-04-30 NOTE — Assessment & Plan Note (Addendum)
Controlled, no change in medication The to commit to regular exercise again re visited

## 2011-05-01 ENCOUNTER — Other Ambulatory Visit: Payer: Self-pay | Admitting: Family Medicine

## 2011-10-10 LAB — LIPID PANEL
Cholesterol: 146 mg/dL (ref 0–200)
Total CHOL/HDL Ratio: 3.8 Ratio
Triglycerides: 83 mg/dL (ref <150)
VLDL: 17 mg/dL (ref 0–40)

## 2011-10-10 LAB — BASIC METABOLIC PANEL
BUN: 9 mg/dL (ref 6–23)
Creat: 0.81 mg/dL (ref 0.50–1.10)
Glucose, Bld: 97 mg/dL (ref 70–99)

## 2011-10-10 LAB — HEPATIC FUNCTION PANEL
ALT: 12 U/L (ref 0–35)
Indirect Bilirubin: 0.3 mg/dL (ref 0.0–0.9)
Total Protein: 7.1 g/dL (ref 6.0–8.3)

## 2012-01-23 ENCOUNTER — Other Ambulatory Visit: Payer: Self-pay | Admitting: Family Medicine

## 2012-01-23 DIAGNOSIS — Z139 Encounter for screening, unspecified: Secondary | ICD-10-CM

## 2012-03-25 ENCOUNTER — Ambulatory Visit (HOSPITAL_COMMUNITY)
Admission: RE | Admit: 2012-03-25 | Discharge: 2012-03-25 | Disposition: A | Discharge status: Home / Self Care | Payer: 59 | Source: Ambulatory Visit | Attending: Family Medicine | Admitting: Family Medicine

## 2012-03-25 DIAGNOSIS — Z139 Encounter for screening, unspecified: Secondary | ICD-10-CM

## 2012-03-25 DIAGNOSIS — Z1231 Encounter for screening mammogram for malignant neoplasm of breast: Secondary | ICD-10-CM | POA: Insufficient documentation

## 2012-04-18 ENCOUNTER — Other Ambulatory Visit (HOSPITAL_COMMUNITY)
Admission: RE | Admit: 2012-04-18 | Discharge: 2012-04-18 | Disposition: A | Discharge status: Home / Self Care | Payer: 59 | Source: Ambulatory Visit | Attending: Family Medicine | Admitting: Family Medicine

## 2012-04-18 ENCOUNTER — Ambulatory Visit (INDEPENDENT_AMBULATORY_CARE_PROVIDER_SITE_OTHER): Payer: 59 | Admitting: Family Medicine

## 2012-04-18 ENCOUNTER — Encounter: Payer: Self-pay | Admitting: Family Medicine

## 2012-04-18 VITALS — BP 100/70 | HR 85 | Resp 16 | Ht 63.0 in | Wt 185.0 lb

## 2012-04-18 DIAGNOSIS — Z1211 Encounter for screening for malignant neoplasm of colon: Secondary | ICD-10-CM

## 2012-04-18 DIAGNOSIS — Z0000 Encounter for general adult medical examination without abnormal findings: Secondary | ICD-10-CM

## 2012-04-18 DIAGNOSIS — Z01419 Encounter for gynecological examination (general) (routine) without abnormal findings: Secondary | ICD-10-CM | POA: Insufficient documentation

## 2012-04-18 DIAGNOSIS — I10 Essential (primary) hypertension: Secondary | ICD-10-CM

## 2012-04-18 DIAGNOSIS — Z124 Encounter for screening for malignant neoplasm of cervix: Secondary | ICD-10-CM

## 2012-04-18 DIAGNOSIS — R7301 Impaired fasting glucose: Secondary | ICD-10-CM

## 2012-04-18 DIAGNOSIS — E785 Hyperlipidemia, unspecified: Secondary | ICD-10-CM

## 2012-04-18 DIAGNOSIS — Z1151 Encounter for screening for human papillomavirus (HPV): Secondary | ICD-10-CM | POA: Insufficient documentation

## 2012-04-18 DIAGNOSIS — R5381 Other malaise: Secondary | ICD-10-CM

## 2012-04-18 LAB — POC HEMOCCULT BLD/STL (OFFICE/1-CARD/DIAGNOSTIC): Fecal Occult Blood, POC: NEGATIVE

## 2012-04-18 MED ORDER — DILTIAZEM HCL ER BEADS 300 MG PO CP24: 300.0000 mg | ORAL_CAPSULE | Freq: Every day | ORAL | Status: DC

## 2012-04-18 NOTE — Progress Notes (Signed)
Subjective:    Patient ID: Kristin Hall, female    DOB: 1956/05/05, 56 y.o.   MRN: [id number]  HPI The PT is here for annual exam  and re-evaluation of chronic medical conditions, medication management and review of any available recent lab and radiology data.  Preventive health is updated, specifically  Cancer screening and Immunization.   Questions or concerns regarding consultations or procedures which the PT has had in the interim are  addressed. The PT denies any adverse reactions to current medications since the last visit.  She is concerned about excessive weight gain and intends to work on this       Review of Systems See HPI Denies recent fever or chills. Denies sinus pressure, nasal congestion, ear pain or sore throat. Denies chest congestion, productive cough or wheezing. Denies chest pains, palpitations and leg swelling Denies abdominal pain, nausea, vomiting,diarrhea or constipation.   Denies dysuria, frequency, hesitancy or incontinence. Denies joint pain, swelling and limitation in mobility. Denies headaches, seizures, numbness, or tingling. Denies depression, anxiety or insomnia. Denies skin break down or rash.        Objective:   Physical Exam Pleasant well nourished female, alert and oriented x 3, in no cardio-pulmonary distress. Afebrile. HEENT No facial trauma or asymetry. Sinuses non tender.  EOMI, PERTL, fundoscopic exam is normal, no hemorhage or exudate.  External ears normal, tympanic membranes clear. Oropharynx moist, no exudate, good dentition. Neck: supple, no adenopathy,JVD or thyromegaly.No bruits.  Chest: Clear to ascultation bilaterally.No crackles or wheezes. Non tender to palpation  Breast: No asymetry,no masses. No nipple discharge or inversion. No axillary or supraclavicular adenopathy  Cardiovascular system; Heart sounds normal,  S1 and  S2 ,no S3.  No murmur, or thrill. Apical beat not displaced Peripheral pulses  normal.  Abdomen: Soft, non tender, no organomegaly or masses. No bruits. Bowel sounds normal. No guarding, tenderness or rebound.  Rectal:  No mass. Guaiac negative stool.  GU: External genitalia normal. No lesions. Vaginal canal normal.Physiologic discharge. Uterus absent, no adnexal masses, no  adnexal tenderness.  Musculoskeletal exam: Full ROM of spine, hips , shoulders and knees. No deformity ,swelling or crepitus noted. No muscle wasting or atrophy.   Neurologic: Cranial nerves 2 to 12 intact. Power, tone ,sensation and reflexes normal throughout. No disturbance in gait. No tremor.  Skin: Intact, no ulceration, erythema , scaling or rash noted. Pigmentation normal throughout  Psych; Normal mood and affect. Judgement and concentration normal        Assessment & Plan:

## 2012-04-18 NOTE — Patient Instructions (Addendum)
F/u in 4 month  Fasting lipid, cmp, HBa1C, tSH , cbc, as soon as possible, due.  Blood pressure med dose of tiazac is reduced  It is important that you exercise regularly at least 30 minutes 5 times a week. If you develop chest pain, have severe difficulty breathing, or feel very tired, stop exercising immediately and seek medical attention   A healthy diet is rich in fruit, vegetables and whole grains. Poultry fish, nuts and beans are a healthy choice for protein rather then red meat. A low sodium diet and drinking 64 ounces of water daily is generally recommended. Oils and sweet should be limited. Carbohydrates especially for those who are diabetic or overweight, should be limited to 3o-45 gram per meal. It is important to eat on a regular schedule, at least 3 times daily. Snacks should be primarily fruits, vegetables or nuts.  Please have a weight loss goal of 5 pounds in the next 4 month

## 2012-04-19 LAB — LIPID PANEL
Cholesterol: 135 mg/dL (ref 0–200)
Triglycerides: 85 mg/dL (ref <150)
VLDL: 17 mg/dL (ref 0–40)

## 2012-04-19 LAB — TSH: TSH: 2.08 u[IU]/mL (ref 0.350–4.500)

## 2012-04-19 LAB — COMPREHENSIVE METABOLIC PANEL
AST: 21 U/L (ref 0–37)
BUN: 12 mg/dL (ref 6–23)
Calcium: 10 mg/dL (ref 8.4–10.5)
Chloride: 101 mEq/L (ref 96–112)
Creat: 0.93 mg/dL (ref 0.50–1.10)

## 2012-04-19 LAB — CBC WITH DIFFERENTIAL/PLATELET
Basophils Absolute: 0 10*3/uL (ref 0.0–0.1)
HCT: 36.5 % (ref 36.0–46.0)
Lymphocytes Relative: 31 % (ref 12–46)
Neutro Abs: 5.9 10*3/uL (ref 1.7–7.7)
Neutrophils Relative %: 62 % (ref 43–77)
Platelets: 431 10*3/uL — ABNORMAL HIGH (ref 150–400)
RDW: 13.9 % (ref 11.5–15.5)
WBC: 9.5 10*3/uL (ref 4.0–10.5)

## 2012-04-19 LAB — HEMOGLOBIN A1C: Hgb A1c MFr Bld: 5.7 % — ABNORMAL HIGH (ref <5.7)

## 2012-04-21 ENCOUNTER — Other Ambulatory Visit: Payer: Self-pay | Admitting: Family Medicine

## 2012-04-21 DIAGNOSIS — R748 Abnormal levels of other serum enzymes: Secondary | ICD-10-CM

## 2012-04-21 DIAGNOSIS — R7989 Other specified abnormal findings of blood chemistry: Secondary | ICD-10-CM

## 2012-04-21 DIAGNOSIS — Z0000 Encounter for general adult medical examination without abnormal findings: Secondary | ICD-10-CM | POA: Insufficient documentation

## 2012-04-21 NOTE — Assessment & Plan Note (Signed)
Dr fields to evlaute pt with persistently elevated alk phos, she is her patient

## 2012-04-21 NOTE — Assessment & Plan Note (Signed)
Over corrected, reduce med dose DASH diet and commitment to daily physical activity for a minimum of 30 minutes discussed and encouraged, as a part of hypertension management. The importance of attaining a healthy weight is also discussed.

## 2012-04-21 NOTE — Assessment & Plan Note (Signed)
Hyperlipidemia:Low fat diet discussed and encouraged.  Controlled, no change in medication   

## 2012-04-21 NOTE — Assessment & Plan Note (Signed)
Wellness exam to include pelvic, breast and rectal done. No problems noted.  Colonoscopy info needs to be updated. Pt has gained an excessive amt of weight and needs to work on weight loss, plans to start walking and limit caloric intake

## 2012-04-25 ENCOUNTER — Encounter: Payer: Self-pay | Admitting: Gastroenterology

## 2012-04-25 ENCOUNTER — Ambulatory Visit (HOSPITAL_COMMUNITY)
Admission: RE | Admit: 2012-04-25 | Discharge: 2012-04-25 | Disposition: A | Discharge status: Home / Self Care | Payer: 59 | Source: Ambulatory Visit | Attending: Family Medicine | Admitting: Family Medicine

## 2012-04-25 DIAGNOSIS — K7689 Other specified diseases of liver: Secondary | ICD-10-CM | POA: Insufficient documentation

## 2012-04-25 DIAGNOSIS — R7989 Other specified abnormal findings of blood chemistry: Secondary | ICD-10-CM

## 2012-04-25 DIAGNOSIS — R748 Abnormal levels of other serum enzymes: Secondary | ICD-10-CM

## 2012-04-29 ENCOUNTER — Encounter: Payer: Self-pay | Admitting: Gastroenterology

## 2012-04-29 ENCOUNTER — Ambulatory Visit (INDEPENDENT_AMBULATORY_CARE_PROVIDER_SITE_OTHER): Payer: 59 | Admitting: Gastroenterology

## 2012-04-29 VITALS — BP 124/73 | HR 86 | Temp 97.2°F | Ht 63.0 in | Wt 185.4 lb

## 2012-04-29 DIAGNOSIS — R748 Abnormal levels of other serum enzymes: Secondary | ICD-10-CM

## 2012-04-29 NOTE — Progress Notes (Signed)
Referring Provider: Kerri Perches, MD Primary Care Physician:  Syliva Overman, MD Primary Gastroenterologist:  Dr. Darrick Penna  Chief Complaint  Patient presents with  . 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 Lodema Hong, secondary to isolated elevated AP. Denies jaundice, pruritis, abdominal pain. No change in bowel habits, rectal bleeding. Occasional reflux but takes a PPI prn. This is rare. [place] of abdomen obtained Dec 2013 without gallstones, +fatty liver, mildly dilated CBD 7mm. Appears isolated elevated AP dating back to at least 2007.   Lab Results  Component Value Date   ALT 15 04/18/2012   AST 21 04/18/2012   ALKPHOS 159* 04/18/2012   BILITOT 0.6 04/18/2012        Past Medical History  Diagnosis Date  . Hyperlipidemia   . Hypertension   . Elevated alkaline phosphatase level     Past Surgical History  Procedure Date  . Abdominal hysterectomy     partial   . Colonoscopy 10/04/2006    SLF: Normal retroflexed view of the rectum/Normal colon without evidence of polyps, masses, inflammatory changes, diverticula or arteriovenous malformations    Current Outpatient Prescriptions  Medication Sig Dispense Refill  . diltiazem (TIAZAC) 300 MG 24 hr capsule Take 1 capsule (300 mg total) by mouth daily.  30 capsule  6  . felodipine (PLENDIL) 10 MG 24 hr tablet Take 1 tablet (10 mg total) by mouth daily.  30 tablet  11  . fexofenadine (ALLEGRA) 180 MG tablet Take 180 mg by mouth daily. As needed      . NON FORMULARY Calcium 600 mg   One tablet daily      . potassium chloride SA (KLOR-CON M20) 20 MEQ tablet Take 1 tablet (20 mEq total) by mouth daily.  30 tablet  11  . valsartan-hydrochlorothiazide (DIOVAN HCT) 320-25 MG per tablet Take 1 tablet by mouth daily.  30 tablet  11  . VYTORIN 10-10 MG per tablet TAKE ONE TABLET BY MOUTH AT BEDTIME FOR CHOLESTEROL.  30 each  3    Allergies  as of 04/29/2012 - Review Complete 04/29/2012  Allergen Reaction Noted  . Metronidazole  11/12/2007    Family History  Problem Relation Age of Onset  . Colon cancer Neg Hx   . Liver disease Neg Hx     History   Social History  . Marital Status: Married    Spouse Name: N/A    Number of Children: N/A  . Years of Education: N/A   Occupational History  . Not on file.   Social History Main Topics  . Smoking status: Never Smoker   . Smokeless tobacco: Not on file  . Alcohol Use: No  . Drug Use: No  . Sexually Active: Yes   Other Topics Concern  . Not on file   Social History Narrative  . No narrative on file    Review of Systems: Negative unless otherwise mentioned in HPI.   Physical Exam: BP 124/73  Pulse 86  Temp 97.2 F (36.2 C) (Oral)  Ht 5\' 3"  (1.6 m)  Wt 185 lb 6.4 oz (84.097 kg)  BMI 32.84 kg/m2 General:   Alert and oriented. Well-developed, well-nourished, pleasant and cooperative. Head:  Normocephalic and atraumatic. Eyes:  Conjunctiva pink, sclera clear, no icterus.   Conjunctiva pink. Ears:  Normal auditory acuity. Nose:  No deformity, discharge,  or lesions. Mouth:  No deformity or lesions, mucosa  pink and moist.  Neck:  Supple, without mass or thyromegaly. Lungs:  Clear to auscultation bilaterally, without wheezing, rales, or rhonchi.  Heart:  S1, S2 present without murmurs noted.  Abdomen:  +BS, soft, non-tender and non-distended. Without mass or HSM. No rebound or guarding. No hernias noted. Rectal:  Deferred  Msk:  Symmetrical without gross deformities. Normal posture. Extremities:  Without clubbing or edema. Neurologic:  Alert and  oriented x4;  grossly normal neurologically. Skin:  Intact, warm and dry without significant lesions or rashes Cervical Nodes:  No significant cervical adenopathy. Psych:  Alert and cooperative. Normal mood and affect.

## 2012-04-29 NOTE — Patient Instructions (Addendum)
Review the diet for fatty liver.  Recommend 1-2# weight loss per week until ideal body weight through exercise & diet. Low fat/cholesterol diet.   Avoid sweets, sodas, fruit juices, sweetened beverages like tea, etc. Gradually increase exercise from 15 min daily up to 1 hr per day 5 days/week. Limit alcohol use.  Please have blood work completed. We will call you with the results. I will ask Dr. Darrick Penna about further testing to include an MRCP. We will be in touch shortly.      Fatty Liver Fatty liver is the accumulation of fat in liver cells. It is also called hepatosteatosis or steatohepatitis. It is normal for your liver to contain some fat. If fat is more than 5 to 10% of your liver's weight, you have fatty liver.   There are often no symptoms (problems) for years while damage is still occurring. People often learn about their fatty liver when they have medical tests for other reasons. Fat can damage your liver for years or even decades without causing problems. When it becomes severe, it can cause fatigue, weight loss, weakness, and confusion. This makes you more likely to develop more serious liver problems. The liver is the largest organ in the body. It does a lot of work and often gives no warning signs when it is sick until late in a disease. The liver has many important jobs including:  Breaking down foods.   Storing vitamins, iron, and other minerals.   Making proteins.   Making bile for food digestion.   Breaking down many products including medications, alcohol and some poisons.  CAUSES   There are a number of different conditions, medications, and poisons that can cause a fatty liver. Eating too many calories causes fat to build up in the liver. Not processing and breaking fats down normally may also cause this. Certain conditions, such as obesity, diabetes, and high triglycerides also cause this. Most fatty liver patients tend to be middle-aged and over weight.   Some  causes of fatty liver are:  Alcohol over consumption.   Malnutrition.   Steroid use.   Valproic acid toxicity.   Obesity.   Cushing's syndrome.   Poisons.   Tetracycline in high dosages.   Pregnancy.   Diabetes.   Hyperlipidemia.   Rapid weight loss.  Some people develop fatty liver even having none of these conditions. SYMPTOMS   Fatty liver most often causes no problems. This is called asymptomatic.  It can be diagnosed with blood tests and also by a liver biopsy.   It is one of the most common causes of minor elevations of liver enzymes on routine blood tests.   Specialized Imaging of the liver using ultrasound, CT (computed tomography) scan, or MRI (magnetic resonance imaging) can suggest a fatty liver but a biopsy is needed to confirm it.   A biopsy involves taking a small sample of liver tissue. This is done by using a needle. It is then looked at under a microscope by a specialist.  TREATMENT   It is important to treat the cause. Simple fatty liver without a medical reason may not need treatment.  Weight loss, fat restriction, and exercise in overweight patients produces inconsistent results but is worth trying.   Fatty liver due to alcohol toxicity may not improve even with stopping drinking.   Good control of diabetes may reduce fatty liver.   Lower your triglycerides through diet, medication or both.   Eat a balanced, healthy diet.   Increase  your physical activity.   Get regular checkups from a liver specialist.   There are no medical or surgical treatments for a fatty liver or NASH, but improving your diet and increasing your exercise may help prevent or reverse some of the damage.  PROGNOSIS   Fatty liver may cause no damage or it can lead to an inflammation of the liver. This is, called steatohepatitis. When it is linked to alcohol abuse, it is called alcoholic steatohepatitis. It often is not linked to alcohol. It is then called nonalcoholic  steatohepatitis, or NASH. Over time the liver may become scarred and hardened. This condition is called cirrhosis. Cirrhosis is serious and may lead to liver failure or cancer. NASH is one of the leading causes of cirrhosis. About 10-20% of Americans have fatty liver and a smaller 2-5% has NASH. Document Released: 06/16/2005 Document Revised: 07/24/2011 Document Reviewed: 08/09/2005 [facility] Patient Information 2013 [place], [place].     Fat and Cholesterol Control Diet Cholesterol levels in your body are determined significantly by your diet. Cholesterol levels may also be related to heart disease. The following material helps to explain this relationship and discusses what you can do to help keep your heart healthy. Not all cholesterol is bad. Low-density lipoprotein (LDL) cholesterol is the "bad" cholesterol. It may cause fatty deposits to build up inside your arteries. High-density lipoprotein (HDL) cholesterol is "good." It helps to remove the "bad" LDL cholesterol from your blood. Cholesterol is a very important risk factor for heart disease. Other risk factors are high blood pressure, smoking, stress, heredity, and weight. The heart muscle gets its supply of blood through the coronary arteries. If your LDL cholesterol is high and your HDL cholesterol is low, you are at risk for having fatty deposits build up in your coronary arteries. This leaves less room through which blood can flow. Without sufficient blood and oxygen, the heart muscle cannot function properly and you may feel chest pains (angina pectoris). When a coronary artery closes up entirely, a part of the heart muscle may die causing a heart attack (myocardial infarction). CHECKING CHOLESTEROL When your caregiver sends your blood to a lab to be examined for cholesterol, a complete lipid (fat) profile may be done. With this test, the total amount of cholesterol and levels of LDL and HDL are determined. Triglycerides are a type of fat that  circulates in the blood. They can also be used to determine heart disease risk. The list below describes what the numbers should be: Test: Total Cholesterol.  Less than 200 mg/dl.  Test: LDL "bad cholesterol."  Less than 100 mg/dl.   Less than 70 mg/dl if you are at very high risk of a heart attack or sudden cardiac death.  Test: HDL "good cholesterol."  Greater than 50 mg/dl for women.   Greater than 40 mg/dl for men.  Test: Triglycerides.  Less than 150 mg/dl.  CONTROLLING CHOLESTEROL WITH DIET Although exercise and lifestyle factors are important, your diet is key. That is because certain foods are known to raise cholesterol and others to lower it. The goal is to balance foods for their effect on cholesterol and more importantly, to replace saturated and trans fat with other types of fat, such as monounsaturated fat, polyunsaturated fat, and omega-3 fatty acids. On average, a person should consume no more than 15 to 17 g of saturated fat daily. Saturated and trans fats are considered "bad" fats, and they will raise LDL cholesterol. Saturated fats are primarily found in animal products  such as meats, butter, and cream. However, that does not mean you need to give up all your favorite foods. Today, there are good tasting, low-fat, low-cholesterol substitutes for most of the things you like to eat. Choose low-fat or nonfat alternatives. Choose round or loin cuts of red meat. These types of cuts are lowest in fat and cholesterol. Chicken (without the skin), fish, veal, and ground [place] breast are great choices. Eliminate fatty meats, such as hot dogs and salami. Even shellfish have little or no saturated fat. Have a 3 oz (85 g) portion when you eat lean meat, poultry, or fish. Trans fats are also called "partially hydrogenated oils." They are oils that have been scientifically manipulated so that they are solid at room temperature resulting in a longer shelf life and improved taste and texture of  foods in which they are added. Trans fats are found in stick margarine, some tub margarines, cookies, crackers, and baked goods.   When baking and cooking, oils are a great substitute for butter. The monounsaturated oils are especially beneficial since it is believed they lower LDL and raise HDL. The oils you should avoid entirely are saturated tropical oils, such as coconut and palm.   Remember to eat a lot from food groups that are naturally free of saturated and trans fat, including fish, fruit, vegetables, beans, grains (barley, rice, couscous, bulgur wheat), and pasta (without cream sauces).   IDENTIFYING FOODS THAT LOWER CHOLESTEROL   Soluble fiber may lower your cholesterol. This type of fiber is found in fruits such as apples, vegetables such as broccoli, potatoes, and carrots, legumes such as beans, peas, and lentils, and grains such as barley. Foods fortified with plant sterols (phytosterol) may also lower cholesterol. You should eat at least 2 g per day of these foods for a cholesterol lowering effect.   Read package labels to identify low-saturated fats, trans fat free, and low-fat foods at the supermarket. Select cheeses that have only 2 to 3 g saturated fat per ounce. Use a heart-healthy tub margarine that is free of trans fats or partially hydrogenated oil. When buying baked goods (cookies, crackers), avoid partially hydrogenated oils. Breads and muffins should be made from whole grains (whole-wheat or whole oat flour, instead of "flour" or "enriched flour"). Buy non-creamy canned soups with reduced salt and no added fats.   FOOD PREPARATION TECHNIQUES   Never deep-fry. If you must fry, either stir-fry, which uses very little fat, or use non-stick cooking sprays. When possible, broil, bake, or roast meats, and steam vegetables. Instead of putting butter or margarine on vegetables, use lemon and herbs, applesauce, and cinnamon (for squash and sweet potatoes), nonfat yogurt, salsa, and low-fat  dressings for salads.   LOW-SATURATED FAT / LOW-FAT FOOD SUBSTITUTES Meats / Saturated Fat (g)  Avoid: Steak, marbled (3 oz/85 g) / 11 g   Choose: Steak, lean (3 oz/85 g) / 4 g   Avoid: Hamburger (3 oz/85 g) / 7 g   Choose: Hamburger, lean (3 oz/85 g) / 5 g   Avoid: Ham (3 oz/85 g) / 6 g   Choose: Ham, lean cut (3 oz/85 g) / 2.4 g   Avoid: Chicken, with skin, dark meat (3 oz/85 g) / 4 g   Choose: Chicken, skin removed, dark meat (3 oz/85 g) / 2 g   Avoid: Chicken, with skin, light meat (3 oz/85 g) / 2.5 g   Choose: Chicken, skin removed, light meat (3 oz/85 g) / 1 g  Dairy /  Saturated Fat (g)  Avoid: Whole milk (1 cup) / 5 g   Choose: Low-fat milk, 2% (1 cup) / 3 g   Choose: Low-fat milk, 1% (1 cup) / 1.5 g   Choose: Skim milk (1 cup) / 0.3 g   Avoid: Hard cheese (1 oz/28 g) / 6 g   Choose: Skim milk cheese (1 oz/28 g) / 2 to 3 g   Avoid: Cottage cheese, 4% fat (1 cup) / 6.5 g   Choose: Low-fat cottage cheese, 1% fat (1 cup) / 1.5 g   Avoid: Ice cream (1 cup) / 9 g   Choose: Sherbet (1 cup) / 2.5 g   Choose: Nonfat frozen yogurt (1 cup) / 0.3 g   Choose: Frozen fruit bar / trace   Avoid: Whipped cream (1 tbs) / 3.5 g   Choose: Nondairy whipped topping (1 tbs) / 1 g  Condiments / Saturated Fat (g)  Avoid: Mayonnaise (1 tbs) / 2 g   Choose: Low-fat mayonnaise (1 tbs) / 1 g   Avoid: Butter (1 tbs) / 7 g   Choose: Extra light margarine (1 tbs) / 1 g   Avoid: Coconut oil (1 tbs) / 11.8 g   Choose: Olive oil (1 tbs) / 1.8 g   Choose: Corn oil (1 tbs) / 1.7 g   Choose: Safflower oil (1 tbs) / 1.2 g   Choose: Sunflower oil (1 tbs) / 1.4 g   Choose: Soybean oil (1 tbs) / 2.4 g   Choose: Canola oil (1 tbs) / 1 g  Document Released: 05/01/2005 Document Revised: 07/24/2011 Document Reviewed: 10/20/2010 [facility] Patient Information 2013 [place], [place].

## 2012-05-01 ENCOUNTER — Other Ambulatory Visit: Payer: Self-pay | Admitting: Family Medicine

## 2012-05-05 NOTE — Assessment & Plan Note (Addendum)
56 year old female with isolated elevated AP level in the setting of otherwise normal LFTs. [place] of abdomen with fatty liver, mildly dilated CBD at 7mm with gallbladder remaining in situ. Unclear etiology, but further work-up is necessary; elevated AP dating back to 2007.   AMA today Discuss with Dr. Darrick Penna further imaging due to dilated CBD (?MRCP, CT, EUS?) Further blood work after discussion with Dr. Darrick Penna.   As separate note, average risk screening colonoscopy in May 2018.

## 2012-05-07 NOTE — Progress Notes (Signed)
Quick Note:  Informed pt. Negative AMA.  ______

## 2012-05-07 NOTE — Progress Notes (Signed)
Faxed to PCP

## 2012-05-13 ENCOUNTER — Telehealth: Payer: Self-pay | Admitting: Family Medicine

## 2012-05-15 ENCOUNTER — Other Ambulatory Visit: Payer: Self-pay | Admitting: Family Medicine

## 2012-05-20 NOTE — Progress Notes (Signed)
   Discussed with Dr. Darrick Penna the [place] of abdomen. Due to ?CBD dilation, needs MRCP. If this is normal, need to set up with [facility] in 4 weeks for an E30 visit.    As side note, if AP remains elevated and MRCP is negative, would discuss benefits vs risks of liver biopsy at appt.

## 2012-05-21 ENCOUNTER — Telehealth: Payer: Self-pay | Admitting: Gastroenterology

## 2012-05-21 ENCOUNTER — Other Ambulatory Visit: Payer: Self-pay | Admitting: Gastroenterology

## 2012-05-21 DIAGNOSIS — K838 Other specified diseases of biliary tract: Secondary | ICD-10-CM

## 2012-05-21 NOTE — Progress Notes (Signed)
Patient is scheduled for Thursday 05/23/12 at 1:00 and I have LMOM for patient to call me and confirm

## 2012-05-21 NOTE — Telephone Encounter (Signed)
She called Radiology and R/S

## 2012-05-21 NOTE — Telephone Encounter (Signed)
Pt called to [facility] her MRI. [phone]

## 2012-05-23 ENCOUNTER — Ambulatory Visit (HOSPITAL_COMMUNITY): Payer: 59

## 2012-06-03 ENCOUNTER — Other Ambulatory Visit: Payer: Self-pay | Admitting: Gastroenterology

## 2012-06-03 ENCOUNTER — Telehealth: Payer: Self-pay | Admitting: Gastroenterology

## 2012-06-03 ENCOUNTER — Ambulatory Visit (HOSPITAL_COMMUNITY)
Admission: RE | Admit: 2012-06-03 | Discharge: 2012-06-03 | Disposition: A | Discharge status: Home / Self Care | Payer: 59 | Source: Ambulatory Visit | Attending: Gastroenterology | Admitting: Gastroenterology

## 2012-06-03 DIAGNOSIS — R748 Abnormal levels of other serum enzymes: Secondary | ICD-10-CM | POA: Insufficient documentation

## 2012-06-03 DIAGNOSIS — R109 Unspecified abdominal pain: Secondary | ICD-10-CM | POA: Insufficient documentation

## 2012-06-03 DIAGNOSIS — K838 Other specified diseases of biliary tract: Secondary | ICD-10-CM

## 2012-06-03 MED ORDER — GADOBENATE DIMEGLUMINE 529 MG/ML IV SOLN
17.0000 mL | Freq: Once | INTRAVENOUS | Status: AC | PRN
  Administered 2012-06-03: 17 mL via INTRAVENOUS

## 2012-06-03 NOTE — Telephone Encounter (Signed)
Called and informed pt. She is aware they will call and schedule OV appt.

## 2012-06-03 NOTE — Progress Notes (Signed)
IV started in left dorsal handl with 22g angiocath.

## 2012-06-03 NOTE — Progress Notes (Signed)
Quick Note:  Normal MRCP. I have sent a note to the nurse to schedule an appt with Dr. Darrick Penna for further discussion. If continued elevation of alk phos, consider liver biopsy. AMA negative. ______

## 2012-06-03 NOTE — Telephone Encounter (Signed)
Please let pt know her MRI is normal. Needs a follow-up visit with Dr. Darrick Penna only in several weeks, E30 slot.

## 2012-06-05 NOTE — Telephone Encounter (Signed)
Pt is aware of OV on 2/13 at 230 with SF and appt card was mailed

## 2012-06-27 ENCOUNTER — Ambulatory Visit: Payer: 59 | Admitting: Gastroenterology

## 2012-07-01 ENCOUNTER — Other Ambulatory Visit: Payer: Self-pay | Admitting: Family Medicine

## 2012-07-19 NOTE — Progress Notes (Signed)
ALK PHOS <1.5X ULN. MRCP NL.  CHECK GGT AFTER NEXT VISIT.  REVIEWED.

## 2012-07-24 ENCOUNTER — Ambulatory Visit (INDEPENDENT_AMBULATORY_CARE_PROVIDER_SITE_OTHER): Payer: 59 | Admitting: Gastroenterology

## 2012-07-24 ENCOUNTER — Encounter: Payer: Self-pay | Admitting: Gastroenterology

## 2012-07-24 VITALS — BP 115/70 | HR 78 | Temp 97.9°F | Ht 65.0 in | Wt 180.6 lb

## 2012-07-24 DIAGNOSIS — R748 Abnormal levels of other serum enzymes: Secondary | ICD-10-CM

## 2012-07-24 NOTE — Assessment & Plan Note (Signed)
CHRONIC ELEVATION < 1.5XULN. NO SIGNS/SX OF LIVER DISEASE.  GGT TODAY. FOLLOW UP IN 6 MOS. HFP ONCE A YEAR.

## 2012-07-24 NOTE — Addendum Note (Signed)
Addended by: [place] on: 07/24/2012 03:42 PM   Modules accepted: Orders

## 2012-07-24 NOTE — Patient Instructions (Signed)
WE WILL CHECK YOUR LIVER TEST(GGT) TODAY.  CONTINUE WEIGHT LOSS EFFORTS.  FOLLOW A LOW FAT DIET. SE INFO BELOW.  FOLLOW UP IN 6 MOS.  YOU SHOULD HAVE YOUR LIVER ENZYMES CHECKED ONCE A YEAR.  Low-Fat Diet BREADS, CEREALS, PASTA, RICE, DRIED PEAS, AND BEANS These products are high in carbohydrates and most are low in fat. Therefore, they can be increased in the diet as substitutes for fatty foods. They too, however, contain calories and should not be eaten in excess. Cereals can be eaten for snacks as well as for breakfast.   FRUITS AND VEGETABLES It is good to eat fruits and vegetables. Besides being sources of fiber, both are rich in vitamins and some minerals. They help you get the daily allowances of these nutrients. Fruits and vegetables can be used for snacks and desserts.  MEATS Limit lean meat, chicken, [place], and fish to no more than 6 ounces per day. Beef, Pork, and Lamb Use lean cuts of beef, pork, and lamb. Lean cuts include:  Extra-lean ground beef.  Arm roast.  Sirloin tip.  Center-cut ham.  Round steak.  Loin chops.  Rump roast.  Tenderloin.  Trim all fat off the outside of meats before cooking. It is not necessary to severely decrease the intake of red meat, but lean choices should be made. Lean meat is rich in protein and contains a highly absorbable form of iron. Premenopausal women, in particular, should avoid reducing lean red meat because this could increase the risk for low red blood cells (iron-deficiency anemia).  Chicken and [place] These are good sources of protein. The fat of poultry can be reduced by removing the skin and underlying fat layers before cooking. Chicken and [place] can be substituted for lean red meat in the diet. Poultry should not be fried or covered with high-fat sauces. Fish and Shellfish Fish is a good source of protein. Shellfish contain cholesterol, but they usually are low in saturated fatty acids. The preparation of fish is important.  Like chicken and [place], they should not be fried or covered with high-fat sauces. EGGS Egg whites contain no fat or cholesterol. They can be eaten often. Try 1 to 2 egg whites instead of whole eggs in recipes or use egg substitutes that do not contain yolk. MILK AND DAIRY PRODUCTS Use skim or 1% milk instead of 2% or whole milk. Decrease whole milk, natural, and processed cheeses. Use nonfat or low-fat (2%) cottage cheese or low-fat cheeses made from vegetable oils. Choose nonfat or low-fat (1 to 2%) yogurt. Experiment with evaporated skim milk in recipes that call for heavy cream. Substitute low-fat yogurt or low-fat cottage cheese for sour cream in dips and salad dressings. Have at least 2 servings of low-fat dairy products, such as 2 glasses of skim (or 1%) milk each day to help get your daily calcium intake. FATS AND OILS Reduce the total intake of fats, especially saturated fat. Butterfat, lard, and beef fats are high in saturated fat and cholesterol. These should be avoided as much as possible. Vegetable fats do not contain cholesterol, but certain vegetable fats, such as coconut oil, palm oil, and palm kernel oil are very high in saturated fats. These should be limited. These fats are often used in bakery goods, processed foods, popcorn, oils, and nondairy creamers. Vegetable shortenings and some peanut butters contain hydrogenated oils, which are also saturated fats. Read the labels on these foods and check for saturated vegetable oils. Unsaturated vegetable oils and fats do not  raise blood cholesterol. However, they should be limited because they are fats and are high in calories. Total fat should still be limited to 30% of your daily caloric intake. Desirable liquid vegetable oils are corn oil, cottonseed oil, olive oil, canola oil, safflower oil, soybean oil, and sunflower oil. Peanut oil is not as good, but small amounts are acceptable. Buy a heart-healthy tub margarine that has no partially  hydrogenated oils in the ingredients. Mayonnaise and salad dressings often are made from unsaturated fats, but they should also be limited because of their high calorie and fat content. Seeds, nuts, peanut butter, olives, and avocados are high in fat, but the fat is mainly the unsaturated type. These foods should be limited mainly to avoid excess calories and fat. OTHER EATING TIPS Snacks  Most sweets should be limited as snacks. They tend to be rich in calories and fats, and their caloric content outweighs their nutritional value. Some good choices in snacks are graham crackers, melba toast, soda crackers, bagels (no egg), English muffins, fruits, and vegetables. These snacks are preferable to snack crackers, [place] fries, TORTILLA CHIPS, and POTATO chips. Popcorn should be air-popped or cooked in small amounts of liquid vegetable oil. Desserts Eat fruit, low-fat yogurt, and fruit ices instead of pastries, cake, and cookies. Sherbet, angel food cake, gelatin dessert, frozen low-fat yogurt, or other frozen products that do not contain saturated fat (pure fruit juice bars, frozen ice pops) are also acceptable.  COOKING METHODS Choose those methods that use little or no fat. They include: Poaching.  Braising.  Steaming.  Grilling.  Baking.  Stir-frying.  Broiling.  Microwaving.  Foods can be cooked in a nonstick pan without added fat, or use a nonfat cooking spray in regular cookware. Limit fried foods and avoid frying in saturated fat. Add moisture to lean meats by using water, broth, cooking wines, and other nonfat or low-fat sauces along with the cooking methods mentioned above. Soups and stews should be chilled after cooking. The fat that forms on top after a few hours in the refrigerator should be skimmed off. When preparing meals, avoid using excess salt. Salt can contribute to raising blood pressure in some people.  EATING AWAY FROM HOME Order entres, potatoes, and vegetables without  sauces or butter. When meat exceeds the size of a deck of cards (3 to 4 ounces), the rest can be taken home for another meal. Choose vegetable or fruit salads and ask for low-calorie salad dressings to be served on the side. Use dressings sparingly. Limit high-fat toppings, such as bacon, crumbled eggs, cheese, sunflower seeds, and olives. Ask for heart-healthy tub margarine instead of butter.

## 2012-07-24 NOTE — Progress Notes (Signed)
Subjective:    Patient ID: Kristin Hall, female    DOB: [birth year], 56 y.o.   MRN: [id number]  PCP: Lodema Hong  HPI PT DENIES FEVER, CHILLS, BRBPR, nausea, vomiting, melena, diarrhea, constipation, abd pain, problems swallowing, problems with sedation, heartburn or indigestion.  NO PROBLEMS WITH ITCHING, WEGHT LOSS, OR FATIGUE. APPETITE: GOOD  Past Medical History  Diagnosis Date  . Hyperlipidemia   . Hypertension   . Elevated alkaline phosphatase level     Past Surgical History  Procedure Laterality Date  . Abdominal hysterectomy      partial   . Colonoscopy  10/04/2006    [facility]: Normal retroflexed view of the rectum/Normal colon without evidence of polyps, masses, inflammatory changes, diverticula or arteriovenous malformations    Allergies  Allergen Reactions  . Metronidazole     Current Outpatient Prescriptions  Medication Sig Dispense Refill  .      . diltiazem (TIAZAC) 420 MG 24 hr capsule TAKE 1 CAPSULE (420 MG TOTAL) BY MOUTH DAILY.    . felodipine (PLENDIL) 10 MG 24 hr tablet TAKE 1 TABLET (10 MG TOTAL) BY MOUTH DAILY.    . fexofenadine (ALLEGRA) 180 MG tablet Take 180 mg by mouth daily. As needed    . KLOR-CON M20 20 MEQ tablet TAKE 1 TABLET (20 MEQ TOTAL)  BY MOUTH DAILY.    . NON FORMULARY Calcium 600 mg   One tablet daily    . valsartan-hydrochlorothiazide (DIOVAN-HCT) 320-25 MG per tablet TAKE 1 TABLET BY MOUTH DAILY.    . VYTORIN 10-10 MG per tablet TAKE ONE TABLET BY MOUTH AT BEDTIME FOR CHOLESTEROL.        Review of Systems 2010: 174 LBS 2013: MAX WEIGHT 185 LBS    Objective:   Physical Exam  Constitutional: She is oriented to person, place, and time. She appears well-nourished. No distress.  HENT:  Head: Normocephalic and atraumatic.  Eyes: Pupils are equal, round, and reactive to light. No scleral icterus.  Neck: Normal range of motion. Neck supple.  Cardiovascular: Normal rate, regular rhythm and normal heart sounds.   Pulmonary/Chest: Effort  normal and breath sounds normal. No respiratory distress.  Abdominal: Soft. Bowel sounds are normal. She exhibits no distension.  Musculoskeletal: Normal range of motion. She exhibits no edema.  Lymphadenopathy:    She has no cervical adenopathy.  Neurological: She is alert and oriented to person, place, and time.  NO FOCAL DEFICITS   Psychiatric: She has a normal mood and affect.          Assessment & Plan:

## 2012-07-24 NOTE — Progress Notes (Signed)
Faxed to PCP

## 2012-07-25 LAB — GAMMA GT: GGT: 18 U/L (ref 7–51)

## 2012-07-25 NOTE — Progress Notes (Signed)
PLEASE CALL PT. HER LIVER TEST IS NORMAL. THE ELEVATED ALK PHOS DOES NOT INDICATE LIVER DISEASE.

## 2012-07-25 NOTE — Progress Notes (Signed)
L/M to call.

## 2012-08-02 ENCOUNTER — Telehealth: Payer: Self-pay | Admitting: Gastroenterology

## 2012-08-02 NOTE — Telephone Encounter (Signed)
Error

## 2012-08-02 NOTE — Progress Notes (Signed)
Reminder in epic °

## 2012-08-14 NOTE — Telephone Encounter (Signed)
Brandi aware of this message

## 2012-12-11 ENCOUNTER — Other Ambulatory Visit: Payer: Self-pay | Admitting: Family Medicine

## 2012-12-26 ENCOUNTER — Encounter: Payer: Self-pay | Admitting: Gastroenterology

## 2012-12-27 ENCOUNTER — Encounter: Payer: Self-pay | Admitting: Family Medicine

## 2012-12-27 ENCOUNTER — Ambulatory Visit (INDEPENDENT_AMBULATORY_CARE_PROVIDER_SITE_OTHER): Payer: Self-pay | Admitting: Family Medicine

## 2012-12-27 VITALS — BP 120/80 | HR 95 | Resp 18 | Ht 63.0 in | Wt 176.1 lb

## 2012-12-27 DIAGNOSIS — F4321 Adjustment disorder with depressed mood: Secondary | ICD-10-CM

## 2012-12-27 DIAGNOSIS — I10 Essential (primary) hypertension: Secondary | ICD-10-CM

## 2012-12-27 DIAGNOSIS — Z139 Encounter for screening, unspecified: Secondary | ICD-10-CM

## 2012-12-27 DIAGNOSIS — E785 Hyperlipidemia, unspecified: Secondary | ICD-10-CM

## 2012-12-27 MED ORDER — DILTIAZEM HCL ER BEADS 300 MG PO CP24: ORAL_CAPSULE | ORAL | Status: DC

## 2012-12-27 NOTE — Patient Instructions (Addendum)
CPE in mid   [place], call if you need me before  No change in medication  Fasting lipid, cmp, GGT, HBA1C and CBC, Vitamin D in Novemebr before OV  Please start aspirin 81mg  once daily for stroke risk reduction  Continue calcium with D daily supplement for bone health  It is important that you exercise regularly at least 30 minutes 5 times a week. If you develop chest pain, have severe difficulty breathing, or feel very tired, stop exercising immediately and seek medical attention    We willprovide tele # for counselling

## 2012-12-27 NOTE — Progress Notes (Signed)
Subjective:    Patient ID: [name], female    DOB: 1956-02-24, 57 y.o.   MRN: [id number]  HPI Pt in for follow up of chronic conditions. Unfortunately, unknown to me, sh unexpectedly lost her spouse of 19 yrs to an MI, he had been in good health prior to this. She is experiencing appropriate grief. States she has excellent family, Church, co worker and community support and denies symptoms of depression. Does not feel the need for therapy at this time. She has lost weight due to reduced appetite in recent times Will wait on l;abs when she switches to her own ins in October , when available ,had been on her husband's insurance   Review of Systems See HPI Denies recent fever or chills. Denies sinus pressure, nasal congestion, ear pain or sore throat. Denies chest congestion, productive cough or wheezing. Denies chest pains, palpitations and leg swelling Denies abdominal pain, nausea, vomiting,diarrhea or constipation.   Denies dysuria, frequency, hesitancy or incontinence. Denies joint pain, swelling and limitation in mobility. Denies headaches, seizures, numbness, or tingling.  Denies skin break down or rash.        Objective:   Physical Exam  Patient alert and oriented and in no cardiopulmonary distress.Tearful , currently acutely grieving loss of spouse  HEENT: No facial asymmetry, EOMI, no sinus tenderness,  oropharynx pink and moist.  Neck supple no adenopathy.  Chest: Clear to auscultation bilaterally.  CVS: S1, S2 no murmurs, no S3.  ABD: Soft non tender. Bowel sounds normal.  Ext: No edema  MS: Adequate ROM spine, shoulders, hips and knees.  Skin: Intact, no ulcerations or rash noted.  Psych: Good eye contact, normal affect. Memory intact not anxious or depressed appearing.Tearful when discussing recent loss of husband  CNS: CN 2-12 intact, power, tone and sensation normal throughout.       Assessment & Plan:

## 2012-12-28 DIAGNOSIS — F4321 Adjustment disorder with depressed mood: Secondary | ICD-10-CM | POA: Insufficient documentation

## 2012-12-28 NOTE — Assessment & Plan Note (Signed)
Updated lab asap. Hyperlipidemia:Low fat diet discussed and encouraged.

## 2012-12-28 NOTE — Assessment & Plan Note (Signed)
Controlled , may be able to lower med dose at next vist

## 2012-12-28 NOTE — Assessment & Plan Note (Signed)
Appropriate grief being expressed, counseling through employee health also offered

## 2013-01-14 ENCOUNTER — Other Ambulatory Visit: Payer: Self-pay | Admitting: Family Medicine

## 2013-03-11 ENCOUNTER — Other Ambulatory Visit: Payer: Self-pay | Admitting: Family Medicine

## 2013-03-11 DIAGNOSIS — Z139 Encounter for screening, unspecified: Secondary | ICD-10-CM

## 2013-04-04 ENCOUNTER — Ambulatory Visit (HOSPITAL_COMMUNITY)
Admission: RE | Admit: 2013-04-04 | Discharge: 2013-04-04 | Disposition: A | Discharge status: Home / Self Care | Payer: 59 | Source: Ambulatory Visit | Attending: Family Medicine | Admitting: Family Medicine

## 2013-04-04 ENCOUNTER — Other Ambulatory Visit: Payer: Self-pay | Admitting: Family Medicine

## 2013-04-04 DIAGNOSIS — Z1231 Encounter for screening mammogram for malignant neoplasm of breast: Secondary | ICD-10-CM | POA: Insufficient documentation

## 2013-04-04 DIAGNOSIS — Z139 Encounter for screening, unspecified: Secondary | ICD-10-CM

## 2013-04-04 LAB — COMPREHENSIVE METABOLIC PANEL
ALT: <8 U/L (ref 0–35)
AST: 13 U/L (ref 0–37)
Alkaline Phosphatase: 137 U/L — ABNORMAL HIGH (ref 39–117)
CO2: 29 mEq/L (ref 19–32)
Creat: 0.85 mg/dL (ref 0.50–1.10)
Sodium: 140 mEq/L (ref 135–145)
Total Bilirubin: 0.6 mg/dL (ref 0.3–1.2)
Total Protein: 7.1 g/dL (ref 6.0–8.3)

## 2013-04-04 LAB — CBC
MCH: 28.2 pg (ref 26.0–34.0)
MCHC: 33.2 g/dL (ref 30.0–36.0)
Platelets: 420 10*3/uL — ABNORMAL HIGH (ref 150–400)
RDW: 12.8 % (ref 11.5–15.5)

## 2013-04-04 LAB — LIPID PANEL
HDL: 36 mg/dL — ABNORMAL LOW (ref >39)
LDL Cholesterol: 73 mg/dL (ref 0–99)
Total CHOL/HDL Ratio: 3.4 Ratio
VLDL: 15 mg/dL (ref 0–40)

## 2013-04-04 LAB — HEMOGLOBIN A1C
Hgb A1c MFr Bld: 5.6 % (ref <5.7)
Mean Plasma Glucose: 114 mg/dL (ref <117)

## 2013-04-05 LAB — VITAMIN D 25 HYDROXY (VIT D DEFICIENCY, FRACTURES): Vit D, 25-Hydroxy: 40 ng/mL (ref 30–89)

## 2013-04-07 LAB — FERRITIN: Ferritin: 80 ng/mL (ref 10–291)

## 2013-04-28 ENCOUNTER — Encounter (INDEPENDENT_AMBULATORY_CARE_PROVIDER_SITE_OTHER): Payer: Self-pay

## 2013-04-28 ENCOUNTER — Other Ambulatory Visit (HOSPITAL_COMMUNITY)
Admission: RE | Admit: 2013-04-28 | Discharge: 2013-04-28 | Disposition: A | Discharge status: Home / Self Care | Payer: 59 | Source: Ambulatory Visit | Attending: Family Medicine | Admitting: Family Medicine

## 2013-04-28 ENCOUNTER — Encounter: Payer: Self-pay | Admitting: Family Medicine

## 2013-04-28 ENCOUNTER — Ambulatory Visit (INDEPENDENT_AMBULATORY_CARE_PROVIDER_SITE_OTHER): Payer: 59 | Admitting: Family Medicine

## 2013-04-28 VITALS — BP 118/80 | HR 82 | Resp 16 | Ht 63.0 in | Wt 173.0 lb

## 2013-04-28 DIAGNOSIS — Z1272 Encounter for screening for malignant neoplasm of vagina: Secondary | ICD-10-CM

## 2013-04-28 DIAGNOSIS — Z0000 Encounter for general adult medical examination without abnormal findings: Secondary | ICD-10-CM

## 2013-04-28 DIAGNOSIS — Z01419 Encounter for gynecological examination (general) (routine) without abnormal findings: Secondary | ICD-10-CM | POA: Insufficient documentation

## 2013-04-28 DIAGNOSIS — Z1211 Encounter for screening for malignant neoplasm of colon: Secondary | ICD-10-CM

## 2013-04-28 DIAGNOSIS — G4700 Insomnia, unspecified: Secondary | ICD-10-CM | POA: Insufficient documentation

## 2013-04-28 LAB — POC HEMOCCULT BLD/STL (OFFICE/1-CARD/DIAGNOSTIC)

## 2013-04-28 MED ORDER — EZETIMIBE-SIMVASTATIN 10-10 MG PO TABS: ORAL_TABLET | ORAL | Status: DC

## 2013-04-28 MED ORDER — TEMAZEPAM 15 MG PO CAPS: 15.0000 mg | ORAL_CAPSULE | Freq: Every evening | ORAL | Status: DC | PRN

## 2013-04-28 MED ORDER — VALSARTAN-HYDROCHLOROTHIAZIDE 320-25 MG PO TABS: ORAL_TABLET | ORAL | Status: DC

## 2013-04-28 MED ORDER — POTASSIUM CHLORIDE CRYS ER 20 MEQ PO TBCR: EXTENDED_RELEASE_TABLET | ORAL | Status: DC

## 2013-04-28 MED ORDER — FELODIPINE ER 10 MG PO TB24: ORAL_TABLET | ORAL | Status: DC

## 2013-04-28 MED ORDER — DILTIAZEM HCL ER BEADS 300 MG PO CP24: ORAL_CAPSULE | ORAL | Status: DC

## 2013-04-28 NOTE — Progress Notes (Signed)
Subjective:    Patient ID: Kristin Hall, female    DOB: 12-25-1955, 57 y.o.   MRN: [id number]  HPI Pt in for annual exam. Generally doing well except that sge is trying to cope with the sudden unexpected passing of her spouse less than 6 months ago.She feels increasingly lonely at home and finds herself feeling more irritated and unhappy on her jb, as she has no outlet at home , doing the best she can but realizes there a re challenges. Willing to consider therapy through her job. Not suicidal or homicidal  Her sleep has become affected , despite good sleep hygiene and she is requesting help with this, even short term   Review of Systems See HPI Denies recent fever or chills. Denies sinus pressure, nasal congestion, ear pain or sore throat. Denies chest congestion, productive cough or wheezing. Denies chest pains, palpitations and leg swelling Denies abdominal pain, nausea, vomiting,diarrhea or constipation.   Denies dysuria, frequency, hesitancy or incontinence. Denies joint pain, swelling and limitation in mobility. Denies headaches, seizures, numbness, or tingling.  Denies skin break down or rash.        Objective:   Physical Exam  Pleasant well nourished female, alert and oriented x 3, in no cardio-pulmonary distress. Afebrile. HEENT No facial trauma or asymetry. Sinuses non tender.  EOMI, PERTL, fundoscopic exam  no hemorhage or exudate.  External ears normal, tympanic membranes clear. Oropharynx moist, no exudate, fairly good dentition. Neck: supple, no adenopathy,JVD or thyromegaly.No bruits.  Chest: Clear to ascultation bilaterally.No crackles or wheezes. Non tender to palpation  Breast: No asymetry,no masses. No nipple discharge or inversion. No axillary or supraclavicular adenopathy  Cardiovascular system; Heart sounds normal,  S1 and  S2 ,no S3.  No murmur, or thrill. Apical beat not displaced Peripheral pulses normal.  Abdomen: Soft, non tender,  no organomegaly or masses. No bruits. Bowel sounds normal. No guarding, tenderness or rebound.  Rectal:  No mass. Guaiac negative stool.  GU: External genitalia normal. No lesions. Vaginal canal normal.Physiologic discharge. Uterus absent no adnexal masses, no  adnexal tenderness.  Musculoskeletal exam: Full ROM of spine, hips , shoulders and knees. No deformity ,swelling or crepitus noted. No muscle wasting or atrophy.   Neurologic: Cranial nerves 2 to 12 intact. Power, tone ,sensation and reflexes normal throughout. No disturbance in gait. No tremor.  Skin: Intact, no ulceration, erythema , scaling or rash noted. Pigmentation normal throughout  Psych; Mildly depressed mood, not anxious.No suicidal or homicidal ideation. Judgement and concentration are normal      Assessment & Plan:

## 2013-04-28 NOTE — Patient Instructions (Addendum)
F/u in 4 month, call if you need me before  I strongly suggest that you get counseling through employee health, to help you to cope with the changes in and stresses in your life, this will help you more than any medication I believe, because I really think that you just need some professional "listening ears" to help to guide you through this challenging time  Low dose sleep aid , restoril is sent to your pharmacy  Practice good sleep hygiene, we will give you info on this  Commit to daily exercise, even if it is alone and in your own home, and start thinking about things to do outsider of your home after work , that you are interested in  No chnages in your regular medications at thsi time  Try taking aspirin every other day at bedtime , see if you tolerate it better then, if not, then the drug is not for you!

## 2013-05-03 NOTE — Assessment & Plan Note (Signed)
Sleep difficulty new in pas 2 to 3 months, associated with mild depression and grief. Sleep hygiene reviewed, short term use of sleep aid. Pt to seek therapy through her job also

## 2013-05-03 NOTE — Assessment & Plan Note (Signed)
Annual exam as documented. Chronic conditions are well controlled, recent labs are excellent Pt to commit to daily exercise for both physical and mental health. She is encouraged to see a therapist to help with grief and coping with spouse's recent death Screening and immunization ar all UTD

## 2013-07-15 ENCOUNTER — Telehealth: Payer: Self-pay | Admitting: Gastroenterology

## 2013-07-15 ENCOUNTER — Other Ambulatory Visit: Payer: Self-pay

## 2013-07-15 DIAGNOSIS — R748 Abnormal levels of other serum enzymes: Secondary | ICD-10-CM

## 2013-07-15 NOTE — Telephone Encounter (Signed)
Lab order mailed to pt.

## 2013-07-15 NOTE — Telephone Encounter (Signed)
Pt is on March recall to have HFP once a year

## 2013-08-12 LAB — HEPATIC FUNCTION PANEL
ALT: 9 U/L (ref 0–35)
AST: 14 U/L (ref 0–37)
Albumin: 4.2 g/dL (ref 3.5–5.2)
Alkaline Phosphatase: 133 U/L — ABNORMAL HIGH (ref 39–117)
BILIRUBIN DIRECT: 0.1 mg/dL (ref 0.0–0.3)
Indirect Bilirubin: 0.5 mg/dL (ref 0.2–1.2)
Total Bilirubin: 0.6 mg/dL (ref 0.2–1.2)
Total Protein: 7.2 g/dL (ref 6.0–8.3)

## 2013-08-13 ENCOUNTER — Telehealth: Payer: Self-pay | Admitting: Gastroenterology

## 2013-08-13 DIAGNOSIS — R748 Abnormal levels of other serum enzymes: Secondary | ICD-10-CM

## 2013-08-13 NOTE — Assessment & Plan Note (Signed)
CHECK HFP ONE A YEAR BY PCP OR GI

## 2013-08-13 NOTE — Telephone Encounter (Signed)
Called patient TO DISCUSS RESULTS. ALK PHOS UNCHANGED. REPEAT ONCE A YEAR WITH DR. Moshe Cipro OR Jaleisa Brose.

## 2013-09-22 ENCOUNTER — Ambulatory Visit (INDEPENDENT_AMBULATORY_CARE_PROVIDER_SITE_OTHER): Payer: 59 | Admitting: Family Medicine

## 2013-09-22 ENCOUNTER — Encounter: Payer: Self-pay | Admitting: Family Medicine

## 2013-09-22 ENCOUNTER — Encounter (INDEPENDENT_AMBULATORY_CARE_PROVIDER_SITE_OTHER): Payer: Self-pay

## 2013-09-22 VITALS — BP 114/70 | HR 72 | Resp 18 | Ht 63.0 in | Wt 172.1 lb

## 2013-09-22 DIAGNOSIS — N3090 Cystitis, unspecified without hematuria: Secondary | ICD-10-CM

## 2013-09-22 DIAGNOSIS — E785 Hyperlipidemia, unspecified: Secondary | ICD-10-CM

## 2013-09-22 DIAGNOSIS — N760 Acute vaginitis: Secondary | ICD-10-CM

## 2013-09-22 DIAGNOSIS — I10 Essential (primary) hypertension: Secondary | ICD-10-CM

## 2013-09-22 DIAGNOSIS — N9489 Other specified conditions associated with female genital organs and menstrual cycle: Secondary | ICD-10-CM

## 2013-09-22 DIAGNOSIS — F4321 Adjustment disorder with depressed mood: Secondary | ICD-10-CM

## 2013-09-22 DIAGNOSIS — N949 Unspecified condition associated with female genital organs and menstrual cycle: Secondary | ICD-10-CM

## 2013-09-22 LAB — POCT URINALYSIS DIPSTICK
Bilirubin, UA: NEGATIVE
Glucose, UA: NEGATIVE
Ketones, UA: NEGATIVE
Leukocytes, UA: NEGATIVE
Nitrite, UA: NEGATIVE
PROTEIN UA: NEGATIVE
RBC UA: NEGATIVE
Spec Grav, UA: 1.025
UROBILINOGEN UA: 0.2
pH, UA: 7.5

## 2013-09-22 LAB — BASIC METABOLIC PANEL
BUN: 11 mg/dL (ref 6–23)
CHLORIDE: 99 meq/L (ref 96–112)
CO2: 31 mEq/L (ref 19–32)
Calcium: 10.5 mg/dL (ref 8.4–10.5)
Creat: 0.83 mg/dL (ref 0.50–1.10)
GLUCOSE: 92 mg/dL (ref 70–99)
Potassium: 4 mEq/L (ref 3.5–5.3)
Sodium: 140 mEq/L (ref 135–145)

## 2013-09-22 LAB — TSH: TSH: 1.655 u[IU]/mL (ref 0.350–4.500)

## 2013-09-22 NOTE — Progress Notes (Signed)
Subjective:    Patient ID: Kristin Hall, female    DOB: 1956/04/21, 58 y.o.   MRN: [id number]  HPI The PT is here for follow up and re-evaluation of chronic medical conditions, medication management and review of any available recent lab and radiology data.  Preventive health is updated, specifically  Cancer screening and Immunization.   . The PT denies any adverse reactions to current medications since the last visit.  2 week h/o vaginal stinging and burning with urinary frequency, no fevr , chills or flank pain, no increase in vaginal d/c , not currently sexually active      Review of Systems See HPI Denies recent fever or chills. Denies sinus pressure, nasal congestion, ear pain or sore throat. Denies chest congestion, productive cough or wheezing. Denies chest pains, palpitations and leg swelling Denies abdominal pain, nausea, vomiting,diarrhea or constipation.    Denies joint pain, swelling and limitation in mobility. Denies headaches, seizures, numbness, or tingling. Denies depression, anxiety or insomnia.Grief is lessening and  The overall quality of life at work and  Home is improving Denies skin break down or rash.        Objective:   Physical Exam BP 114/70  Pulse 72  Resp 18  Ht 5\' 3"  (1.6 m)  Wt 172 lb 1.9 oz (78.073 kg)  BMI 30.50 kg/m2  SpO2 97% Patient alert and oriented and in no cardiopulmonary distress.  HEENT: No facial asymmetry, EOMI,   oropharynx pink and moist.  Neck supple no JVD, no mass.  Chest: Clear to auscultation bilaterally.  CVS: S1, S2 no murmurs, no S3.  ABD: Soft non tender. No renal angle or supra pubic tendderness  Ext: No edema  MS: Adequate ROM spine, shoulders, hips and knees.  Skin: Intact, no ulcerations or rash noted.  Psych: Good eye contact, normal affect. Memory intact not anxious or depressed appearing.  CNS: CN 2-12 intact, power,  normal throughout.no focal deficits noted.        Assessment & Plan:    HYPERTENSION Will d/c plendil and see if pt requires this medication anymore, she is on 3 other BP meds. F/u in 2 month  HYPERLIPIDEMIA Controlled, no change in medication  Low HDL , needs to commit to regular physical activity Hyperlipidemia:Low fat diet discussed and encouraged.        Cystitis Burning on urination UA in office today is normal, no sign of infection, pt reassured  Vaginitis and vulvovaginitis Vaginal swabs sent for wet prep, also HSV2 testing  Feeling grief Resolving over time in an appropriate fashion

## 2013-09-22 NOTE — Patient Instructions (Addendum)
F/u in 2 month, call if you need me before  STOP plendil, I do not believe that you need this for  BP management anymore  Your urine is not showing any sign of infection  You will have swabs sent  For vaginal infection , and you also need blood test today to evaluate your vaginal itching, we will contact you with results  Labs today, chem 7 , HSV 2, TSH   Fasting lipid in 2 month, before f/u. Please commit to regular exercise like walking to improve your "good cholesterol"  You are referred for US of the arteries to your kidneys asince you require such high  Doses of medication to control your blood pressure  (holding off on this at this time, decision made to wait until BP re evaluated at next visit decision made 10/28/2013)

## 2013-09-23 LAB — HSV 2 ANTIBODY, IGG: HSV 2 Glycoprotein G Ab, IgG: 0.22 IV

## 2013-09-23 LAB — CERVICOVAGINAL ANCILLARY ONLY
WET PREP (BD AFFIRM): NEGATIVE
WET PREP (BD AFFIRM): NEGATIVE
Wet Prep (BD Affirm): NEGATIVE

## 2013-09-24 ENCOUNTER — Telehealth: Payer: Self-pay | Admitting: Family Medicine

## 2013-09-24 NOTE — Telephone Encounter (Addendum)
pls sched appt at the facility she requests for renal artery [place] if can be done there, I am unsure if it can , pls klet her know what you are able to do

## 2013-09-25 NOTE — Telephone Encounter (Signed)
Left message for patient to call back  

## 2013-09-25 NOTE — Telephone Encounter (Signed)
noted 

## 2013-09-26 NOTE — Telephone Encounter (Signed)
Patient is aware of appointment °

## 2013-10-01 ENCOUNTER — Ambulatory Visit (HOSPITAL_COMMUNITY): Payer: 59

## 2013-10-01 ENCOUNTER — Other Ambulatory Visit: Payer: Self-pay

## 2013-10-01 MED ORDER — FLUCONAZOLE 150 MG PO TABS: 150.0000 mg | ORAL_TABLET | Freq: Once | ORAL | Status: DC

## 2013-10-03 ENCOUNTER — Ambulatory Visit (HOSPITAL_COMMUNITY)
Admission: RE | Admit: 2013-10-03 | Discharge: 2013-10-03 | Disposition: A | Discharge status: Home / Self Care | Payer: 59 | Source: Ambulatory Visit | Attending: Family Medicine | Admitting: Family Medicine

## 2013-10-03 ENCOUNTER — Other Ambulatory Visit (HOSPITAL_COMMUNITY): Payer: Self-pay | Admitting: Family Medicine

## 2013-10-03 DIAGNOSIS — I10 Essential (primary) hypertension: Secondary | ICD-10-CM | POA: Insufficient documentation

## 2013-10-03 DIAGNOSIS — Z8679 Personal history of other diseases of the circulatory system: Secondary | ICD-10-CM

## 2013-10-03 DIAGNOSIS — I701 Atherosclerosis of renal artery: Secondary | ICD-10-CM

## 2013-10-03 NOTE — Progress Notes (Signed)
Bilateral renal artery duplex completed.  No evidence of significant renal artery stenosis.

## 2013-10-28 DIAGNOSIS — N760 Acute vaginitis: Secondary | ICD-10-CM | POA: Insufficient documentation

## 2013-10-28 DIAGNOSIS — N3090 Cystitis, unspecified without hematuria: Secondary | ICD-10-CM | POA: Insufficient documentation

## 2013-10-28 NOTE — Assessment & Plan Note (Signed)
Vaginal swabs sent for wet prep, also HSV2 testing

## 2013-10-28 NOTE — Assessment & Plan Note (Signed)
Will d/c plendil and see if pt requires this medication anymore, she is on 3 other BP meds. F/u in 2 month

## 2013-10-28 NOTE — Assessment & Plan Note (Signed)
Controlled, no change in medication  Low HDL , needs to commit to regular physical activity Hyperlipidemia:Low fat diet discussed and encouraged.

## 2013-10-28 NOTE — Assessment & Plan Note (Signed)
Burning on urination UA in office today is normal, no sign of infection, pt reassured

## 2013-10-28 NOTE — Assessment & Plan Note (Signed)
Resolving over time in an appropriate fashion

## 2013-11-06 ENCOUNTER — Other Ambulatory Visit: Payer: Self-pay | Admitting: Family Medicine

## 2013-11-18 LAB — LIPID PANEL
CHOLESTEROL: 158 mg/dL (ref 0–200)
HDL: 40 mg/dL (ref >39)
LDL CALC: 98 mg/dL (ref 0–99)
Total CHOL/HDL Ratio: 4 Ratio
Triglycerides: 100 mg/dL (ref <150)
VLDL: 20 mg/dL (ref 0–40)

## 2014-01-05 ENCOUNTER — Ambulatory Visit (INDEPENDENT_AMBULATORY_CARE_PROVIDER_SITE_OTHER): Payer: 59 | Admitting: Family Medicine

## 2014-01-05 ENCOUNTER — Encounter: Payer: Self-pay | Admitting: Family Medicine

## 2014-01-05 ENCOUNTER — Encounter (INDEPENDENT_AMBULATORY_CARE_PROVIDER_SITE_OTHER): Payer: Self-pay

## 2014-01-05 VITALS — BP 142/82 | HR 70 | Resp 18 | Wt 178.0 lb

## 2014-01-05 DIAGNOSIS — E785 Hyperlipidemia, unspecified: Secondary | ICD-10-CM

## 2014-01-05 DIAGNOSIS — F4321 Adjustment disorder with depressed mood: Secondary | ICD-10-CM

## 2014-01-05 DIAGNOSIS — I10 Essential (primary) hypertension: Secondary | ICD-10-CM

## 2014-01-05 NOTE — Patient Instructions (Signed)
CPE Dec 15 or after, call if you need me before  Please work on lifestyle to improve blood pressure   Fasting cmp , lipid, CBC in December before visit  It is important that you exercise regularly at least 30 minutes 5 times a week. If you develop chest pain, have severe difficulty breathing, or feel very tired, stop exercising immediately and seek medical attention

## 2014-01-20 NOTE — Assessment & Plan Note (Signed)
Recovered well and functioning at a good level

## 2014-01-20 NOTE — Assessment & Plan Note (Signed)
Hyperlipidemia:Low fat diet discussed and encouraged.  Controlled, no change in medication Updated lab needed at/ before next visit.  

## 2014-01-20 NOTE — Progress Notes (Signed)
Subjective:    Patient ID: [name], female    DOB: 1955-12-01, 58 y.o.   MRN: [id number]  HPI  The PT is here for follow up and re-evaluation of chronic medical conditions, in particular hypertension as she has ahd a recent change in medication with reduction in pill count and dosage. medication management and review of any available recent lab and radiology data.  Preventive health is updated, specifically  Cancer screening and Immunization.   Questions or concerns regarding consultations or procedures which the PT has had in the interim are  addressed. The PT denies any adverse reactions to current medications since the last visit.  There are no new concerns.  There are no specific complaints      Review of Systems See HPI Denies recent fever or chills. Denies sinus pressure, nasal congestion, ear pain or sore throat. Denies chest congestion, productive cough or wheezing. Denies chest pains, palpitations and leg swelling Denies abdominal pain, nausea, vomiting,diarrhea or constipation.   Denies dysuria, frequency, hesitancy or incontinence. Denies joint pain, swelling and limitation in mobility. Denies headaches, seizures, numbness, or tingling. Denies depression, anxiety or insomnia. Denies skin break down or rash.        Objective:   Physical Exam BP 142/82  Pulse 70  Resp 18  Wt 178 lb (80.74 kg)  SpO2 94% Patient alert and oriented and in no cardiopulmonary distress.  HEENT: No facial asymmetry, EOMI,   oropharynx pink and moist.  Neck supple no JVD, no mass.  Chest: Clear to auscultation bilaterally.  CVS: S1, S2 no murmurs, no S3.Regular rate.  ABD: Soft non tender.   Ext: No edema  MS: Adequate ROM spine, shoulders, hips and knees.  Skin: Intact, no ulcerations or rash noted.  Psych: Good eye contact, normal affect. Memory intact not anxious or depressed appearing.  CNS: CN 2-12 intact, power,  normal throughout.no focal deficits  noted.        Assessment & Plan:  HYPERTENSION Not at goal , however no change in currrent meds DASH diet and commitment to daily physical activity for a minimum of 30 minutes discussed and encouraged, as a part of hypertension management. The importance of attaining a healthy weight is also discussed.   HYPERLIPIDEMIA Hyperlipidemia:Low fat diet discussed and encouraged. Controlled, no change in medication   Updated lab needed at/ before next visit.   Feeling grief Recovered well and functioning at a good level

## 2014-01-20 NOTE — Assessment & Plan Note (Signed)
Not at goal , however no change in currrent meds DASH diet and commitment to daily physical activity for a minimum of 30 minutes discussed and encouraged, as a part of hypertension management. The importance of attaining a healthy weight is also discussed.

## 2014-02-25 ENCOUNTER — Encounter (INDEPENDENT_AMBULATORY_CARE_PROVIDER_SITE_OTHER): Payer: Self-pay

## 2014-02-25 ENCOUNTER — Ambulatory Visit (INDEPENDENT_AMBULATORY_CARE_PROVIDER_SITE_OTHER): Payer: 59 | Admitting: Family Medicine

## 2014-02-25 ENCOUNTER — Encounter: Payer: Self-pay | Admitting: Family Medicine

## 2014-02-25 VITALS — BP 126/78 | HR 68 | Resp 18 | Ht 63.0 in | Wt 179.1 lb

## 2014-02-25 DIAGNOSIS — M25552 Pain in left hip: Secondary | ICD-10-CM

## 2014-02-25 DIAGNOSIS — I10 Essential (primary) hypertension: Secondary | ICD-10-CM

## 2014-02-25 DIAGNOSIS — R109 Unspecified abdominal pain: Secondary | ICD-10-CM

## 2014-02-25 DIAGNOSIS — R1030 Lower abdominal pain, unspecified: Secondary | ICD-10-CM

## 2014-02-25 DIAGNOSIS — N3090 Cystitis, unspecified without hematuria: Secondary | ICD-10-CM

## 2014-02-25 DIAGNOSIS — K219 Gastro-esophageal reflux disease without esophagitis: Secondary | ICD-10-CM

## 2014-02-25 LAB — POCT URINALYSIS DIPSTICK
Bilirubin, UA: NEGATIVE
Glucose, UA: NEGATIVE
KETONES UA: NEGATIVE
Leukocytes, UA: NEGATIVE
Nitrite, UA: NEGATIVE
PH UA: 6
PROTEIN UA: NEGATIVE
SPEC GRAV UA: 1.015
Urobilinogen, UA: 0.2

## 2014-02-25 LAB — POC HEMOCCULT BLD/STL (OFFICE/1-CARD/DIAGNOSTIC): Fecal Occult Blood, POC: NEGATIVE

## 2014-02-25 MED ORDER — TRAMADOL HCL 50 MG PO TABS: ORAL_TABLET | ORAL | Status: DC

## 2014-02-25 MED ORDER — PANTOPRAZOLE SODIUM 40 MG PO TBEC: 40.0000 mg | DELAYED_RELEASE_TABLET | Freq: Every day | ORAL | Status: DC

## 2014-02-25 NOTE — Assessment & Plan Note (Signed)
Left flank pain s/p fall CCUA in office , trace blood, will send for further testing and renal US ordered

## 2014-02-25 NOTE — Assessment & Plan Note (Signed)
Controlled, no change in medication  

## 2014-02-25 NOTE — Assessment & Plan Note (Signed)
increased abdominal pain following daily NSAID use for approx 3 weeks. Start protonix for 4 weeks and refer to GI Pt advised to avoid NSAIDS

## 2014-02-25 NOTE — Progress Notes (Signed)
Subjective:    Patient ID: Kristin Hall, female    DOB: 01-Oct-1955, 58 y.o.   MRN: [id number]  HPI Pt fell approx 3 weeks ago, and has been taking motrin 200 mg  daily no relief, , 1 week h/o loose stool , had 5 stool last Wednesday, had loose stool Sat and Sunday, had 5 yesterday, has used  imodium intermittently for relief of diarheah. C/o 10 plus lower abd pain, no h/o black stool or blood in stool . C/o ongoing 10 plus low back pain esp in left flank and over left SI joint , tearful and anxious. Ms. Tait reports an accidental fall when her foot caught on the side of the walkway. Still limping in [pain and experiencing reduced mobility Denies vomit  Denies freuency     Review of Systems See HPI Denies recent fever or chills. Denies sinus pressure, nasal congestion, ear pain or sore throat. Denies chest congestion, productive cough or wheezing. Denies chest pains, palpitations and leg swelling Denies dysuria, frequency, hesitancy or incontinence.  Denies headaches, seizures, numbness, or tingling. Denies depression, anxiety or insomnia. Denies skin break down or rash.        Objective:   Physical Exam BP 126/78  Pulse 68  Resp 18  Ht 5\' 3"  (1.6 m)  Wt 179 lb 1.9 oz (81.248 kg)  BMI 31.74 kg/m2  SpO2 95% Pt tearful, anxious and in pain Patient alert and oriented and in no cardiopulmonary distress.  HEENT: No facial asymmetry, EOMI,   oropharynx pink and moist.  Neck supple no JVD, no mass.  Chest: Clear to auscultation bilaterally.  CVS: S1, S2 no murmurs, no S3.Regular rate.  ABD: Soft , bilateral lower abdominal tenderness, no guarding or rebound. No organomegaly or mass, normal bowel sounds. Supra pubic tenderness and left renal angle tenderness.  Rectal: no mass, heme negative stool  Ext: No edema  MS: decreased  ROM lumbar  spine, tender over left SI joint, normal ROM shoulders, hips and knees.  Skin: Intact, no ulcerations or rash  noted.  Psych: Good eye contact, normal affect. Memory intact anxious and tearful, not  depressed appearing.  CNS: CN 2-12 intact, power,  normal throughout.no focal deficits noted.        Assessment & Plan:  Left hip pain Pt fell accidentally approx 09/20 , direct trauma to left hip Still limping, pain is rated at 10, and she has very localized left SI joint tenderness Xray of l/S spine and hip sand refer to ortho for further eval and management Tramadol for bedtime use for pain short term  Left flank pain Left flank pain s/p fall CCUA in office , trace blood, will send for further testing and renal US ordered  Abdominal pain, lower Abdominal exam negative for acute abdomen also history not suggestive of this. Positive flank and suprapubic tenderness s/p fall, UA abn, will check renal US also refer to GI for furhter eval  GERD increased abdominal pain following daily NSAID use for approx 3 weeks. Start protonix for 4 weeks and refer to GI Pt advised to avoid NSAIDS  Essential hypertension Controlled, no change in medication   Cystitis Suprapubic pain with abn UA, specimen sent for further testing

## 2014-02-25 NOTE — Assessment & Plan Note (Signed)
Suprapubic pain with abn UA, specimen sent for further testing

## 2014-02-25 NOTE — Assessment & Plan Note (Signed)
Abdominal exam negative for acute abdomen also history not suggestive of this. Positive flank and suprapubic tenderness s/p fall, UA abn, will check renal US also refer to GI for furhter eval

## 2014-02-25 NOTE — Patient Instructions (Addendum)
F/u in 4 month, call if you need me before  You are referred to Dr Oneida Alar and Dr Corrinne Eagle of back, and kidney US are ordered  Protonix and tramadol are prescribed for stomach and back pain  Urine is sent for additional testing we will be in touch with final result if abnormal  Pls get labs already ordered for 4 month follow up  [facility] that you feel better soon!  Rectal exam shows no hidden blood

## 2014-02-25 NOTE — Assessment & Plan Note (Addendum)
Pt fell accidentally approx 09/20 , direct trauma to left hip Still limping, pain is rated at 10, and she has very localized left SI joint tenderness Xray of l/S spine and hip sand refer to ortho for further eval and management Tramadol for bedtime use for pain short term

## 2014-02-26 ENCOUNTER — Encounter: Payer: Self-pay | Admitting: Gastroenterology

## 2014-02-27 LAB — URINALYSIS, ROUTINE W REFLEX MICROSCOPIC
BILIRUBIN URINE: NEGATIVE
GLUCOSE, UA: NEGATIVE mg/dL
Hgb urine dipstick: NEGATIVE
Ketones, ur: NEGATIVE mg/dL
LEUKOCYTES UA: NEGATIVE
Nitrite: NEGATIVE
PROTEIN: NEGATIVE mg/dL
SPECIFIC GRAVITY, URINE: 1.014 (ref 1.005–1.030)
Urobilinogen, UA: 0.2 mg/dL (ref 0.0–1.0)
pH: 5.5 (ref 5.0–8.0)

## 2014-03-02 ENCOUNTER — Ambulatory Visit (HOSPITAL_COMMUNITY)
Admission: RE | Admit: 2014-03-02 | Discharge: 2014-03-02 | Disposition: A | Discharge status: Home / Self Care | Payer: 59 | Source: Ambulatory Visit | Attending: Family Medicine | Admitting: Family Medicine

## 2014-03-02 DIAGNOSIS — M25552 Pain in left hip: Secondary | ICD-10-CM | POA: Diagnosis not present

## 2014-03-02 DIAGNOSIS — I10 Essential (primary) hypertension: Secondary | ICD-10-CM | POA: Insufficient documentation

## 2014-03-02 DIAGNOSIS — W1789XA Other fall from one level to another, initial encounter: Secondary | ICD-10-CM | POA: Diagnosis not present

## 2014-03-02 DIAGNOSIS — M545 Low back pain: Secondary | ICD-10-CM | POA: Insufficient documentation

## 2014-03-02 DIAGNOSIS — R109 Unspecified abdominal pain: Secondary | ICD-10-CM | POA: Insufficient documentation

## 2014-03-09 ENCOUNTER — Telehealth: Payer: Self-pay

## 2014-03-09 ENCOUNTER — Other Ambulatory Visit: Payer: Self-pay | Admitting: Family Medicine

## 2014-03-09 DIAGNOSIS — Z1231 Encounter for screening mammogram for malignant neoplasm of breast: Secondary | ICD-10-CM

## 2014-03-09 NOTE — Telephone Encounter (Signed)
CANCELLED APPOINTMENT

## 2014-03-09 NOTE — Telephone Encounter (Signed)
Pt called- she said she wanted to cancel her appt on 04/02/14 at 8:30. She did not give a reason why she wanted to cancel and said she would call back later and reschedule.  Kristin Hall, please cancel her appt.

## 2014-03-19 ENCOUNTER — Encounter: Payer: Self-pay | Admitting: Orthopedic Surgery

## 2014-04-02 ENCOUNTER — Ambulatory Visit: Payer: 59 | Admitting: Gastroenterology

## 2014-04-06 ENCOUNTER — Ambulatory Visit (HOSPITAL_COMMUNITY)
Admission: RE | Admit: 2014-04-06 | Discharge: 2014-04-06 | Disposition: A | Discharge status: Home / Self Care | Payer: 59 | Source: Ambulatory Visit | Attending: Family Medicine | Admitting: Family Medicine

## 2014-04-06 DIAGNOSIS — Z1231 Encounter for screening mammogram for malignant neoplasm of breast: Secondary | ICD-10-CM | POA: Diagnosis not present

## 2014-04-06 DIAGNOSIS — R928 Other abnormal and inconclusive findings on diagnostic imaging of breast: Secondary | ICD-10-CM | POA: Insufficient documentation

## 2014-04-08 ENCOUNTER — Other Ambulatory Visit: Payer: Self-pay | Admitting: Family Medicine

## 2014-04-08 DIAGNOSIS — R928 Other abnormal and inconclusive findings on diagnostic imaging of breast: Secondary | ICD-10-CM

## 2014-04-17 ENCOUNTER — Other Ambulatory Visit: Payer: Self-pay | Admitting: Family Medicine

## 2014-04-17 DIAGNOSIS — R928 Other abnormal and inconclusive findings on diagnostic imaging of breast: Secondary | ICD-10-CM

## 2014-04-28 ENCOUNTER — Other Ambulatory Visit: Payer: Self-pay | Admitting: Family Medicine

## 2014-05-01 ENCOUNTER — Ambulatory Visit
Admission: RE | Admit: 2014-05-01 | Discharge: 2014-05-01 | Disposition: A | Discharge status: Home / Self Care | Payer: 59 | Source: Ambulatory Visit | Attending: Family Medicine | Admitting: Family Medicine

## 2014-05-01 DIAGNOSIS — R928 Other abnormal and inconclusive findings on diagnostic imaging of breast: Secondary | ICD-10-CM

## 2014-05-04 ENCOUNTER — Other Ambulatory Visit: Payer: 59

## 2014-05-20 LAB — CBC
HCT: 39.1 % (ref 36.0–46.0)
Hemoglobin: 12.7 g/dL (ref 12.0–15.0)
MCH: 28 pg (ref 26.0–34.0)
MCHC: 32.5 g/dL (ref 30.0–36.0)
MCV: 86.1 fL (ref 78.0–100.0)
MPV: 10.5 fL (ref 8.6–12.4)
Platelets: 434 10*3/uL — ABNORMAL HIGH (ref 150–400)
RBC: 4.54 MIL/uL (ref 3.87–5.11)
RDW: 13.4 % (ref 11.5–15.5)
WBC: 8.7 10*3/uL (ref 4.0–10.5)

## 2014-05-20 LAB — COMPREHENSIVE METABOLIC PANEL
ALBUMIN: 4.3 g/dL (ref 3.5–5.2)
ALT: 11 U/L (ref 0–35)
AST: 15 U/L (ref 0–37)
Alkaline Phosphatase: 119 U/L — ABNORMAL HIGH (ref 39–117)
BUN: 10 mg/dL (ref 6–23)
CHLORIDE: 104 meq/L (ref 96–112)
CO2: 31 mEq/L (ref 19–32)
CREATININE: 0.78 mg/dL (ref 0.50–1.10)
Calcium: 9.8 mg/dL (ref 8.4–10.5)
Glucose, Bld: 86 mg/dL (ref 70–99)
Potassium: 4.1 mEq/L (ref 3.5–5.3)
Sodium: 144 mEq/L (ref 135–145)
TOTAL PROTEIN: 7.3 g/dL (ref 6.0–8.3)
Total Bilirubin: 0.6 mg/dL (ref 0.2–1.2)

## 2014-05-20 LAB — LIPID PANEL
Cholesterol: 147 mg/dL (ref 0–200)
HDL: 42 mg/dL (ref >39)
LDL CALC: 89 mg/dL (ref 0–99)
Total CHOL/HDL Ratio: 3.5 Ratio
Triglycerides: 81 mg/dL (ref <150)
VLDL: 16 mg/dL (ref 0–40)

## 2014-07-06 ENCOUNTER — Telehealth: Payer: Self-pay

## 2014-07-06 ENCOUNTER — Ambulatory Visit (INDEPENDENT_AMBULATORY_CARE_PROVIDER_SITE_OTHER): Payer: 59

## 2014-07-06 DIAGNOSIS — N3001 Acute cystitis with hematuria: Secondary | ICD-10-CM

## 2014-07-06 LAB — POCT URINALYSIS DIPSTICK
Bilirubin, UA: NEGATIVE
Glucose, UA: NEGATIVE
KETONES UA: NEGATIVE
Nitrite, UA: POSITIVE
PH UA: 5.5
PROTEIN UA: NEGATIVE
SPEC GRAV UA: 1.025
Urobilinogen, UA: 0.2

## 2014-07-06 MED ORDER — CIPROFLOXACIN HCL 500 MG PO TABS: 500.0000 mg | ORAL_TABLET | Freq: Two times a day (BID) | ORAL | Status: DC

## 2014-07-06 NOTE — Progress Notes (Signed)
Dysuria and frequency x 4 days. Will send urine for culture and 3 days of cipro given per Dr.

## 2014-07-06 NOTE — Telephone Encounter (Signed)
Frequency and a lot of pain when urinating. Will come in at 3:30 for Nurse visit

## 2014-07-10 LAB — URINE CULTURE: Colony Count: >=100000

## 2014-08-04 ENCOUNTER — Other Ambulatory Visit: Payer: Self-pay | Admitting: Family Medicine

## 2014-08-10 ENCOUNTER — Encounter: Payer: Self-pay | Admitting: Family Medicine

## 2014-08-10 ENCOUNTER — Ambulatory Visit (INDEPENDENT_AMBULATORY_CARE_PROVIDER_SITE_OTHER): Payer: 59 | Admitting: Family Medicine

## 2014-08-10 VITALS — BP 110/76 | HR 120 | Temp 102.9°F | Resp 18 | Ht 63.0 in | Wt 170.1 lb

## 2014-08-10 DIAGNOSIS — J029 Acute pharyngitis, unspecified: Secondary | ICD-10-CM

## 2014-08-10 DIAGNOSIS — R51 Headache: Secondary | ICD-10-CM

## 2014-08-10 DIAGNOSIS — J0100 Acute maxillary sinusitis, unspecified: Secondary | ICD-10-CM | POA: Diagnosis not present

## 2014-08-10 DIAGNOSIS — I10 Essential (primary) hypertension: Secondary | ICD-10-CM

## 2014-08-10 DIAGNOSIS — G44001 Cluster headache syndrome, unspecified, intractable: Secondary | ICD-10-CM | POA: Diagnosis not present

## 2014-08-10 DIAGNOSIS — R519 Headache, unspecified: Secondary | ICD-10-CM | POA: Insufficient documentation

## 2014-08-10 MED ORDER — METHYLPREDNISOLONE ACETATE 80 MG/ML IJ SUSP
80.0000 mg | Freq: Once | INTRAMUSCULAR | Status: AC
  Administered 2014-08-10: 80 mg via INTRAMUSCULAR

## 2014-08-10 MED ORDER — PENICILLIN V POTASSIUM 500 MG PO TABS: 500.0000 mg | ORAL_TABLET | Freq: Three times a day (TID) | ORAL | Status: DC

## 2014-08-10 MED ORDER — PREDNISONE 5 MG PO TABS: 5.0000 mg | ORAL_TABLET | Freq: Two times a day (BID) | ORAL | Status: DC

## 2014-08-10 MED ORDER — PREDNISONE 5 MG PO TABS: 5.0000 mg | ORAL_TABLET | Freq: Two times a day (BID) | ORAL | Status: AC

## 2014-08-10 MED ORDER — KETOROLAC TROMETHAMINE 60 MG/2ML IM SOLN
60.0000 mg | Freq: Once | INTRAMUSCULAR | Status: AC
  Administered 2014-08-10: 60 mg via INTRAMUSCULAR

## 2014-08-10 MED ORDER — IBUPROFEN 800 MG PO TABS: ORAL_TABLET | ORAL | Status: DC

## 2014-08-10 MED ORDER — CEFTRIAXONE SODIUM 500 MG IJ SOLR
500.0000 mg | Freq: Once | INTRAMUSCULAR | Status: AC
  Administered 2014-08-10: 500 mg via INTRAMUSCULAR

## 2014-08-10 NOTE — Assessment & Plan Note (Signed)
Rocephin in office and antibiotic course

## 2014-08-10 NOTE — Patient Instructions (Signed)
F/u in April as before , call if you need me sooner  Injections in office for headache, and prednisone prescribed for 5 days  You are treated for acute sinus infection over left cheek, medication administered and prescribed  Work excuse from 3/27 to return 08/14/2014  Fluids, rest, meds as prescribed  CBC and diff and chem 7 today

## 2014-08-11 LAB — BASIC METABOLIC PANEL
BUN: 13 mg/dL (ref 6–23)
CO2: 29 meq/L (ref 19–32)
Calcium: 9.7 mg/dL (ref 8.4–10.5)
Chloride: 97 mEq/L (ref 96–112)
Creat: 1.01 mg/dL (ref 0.50–1.10)
Glucose, Bld: 104 mg/dL — ABNORMAL HIGH (ref 70–99)
POTASSIUM: 3.7 meq/L (ref 3.5–5.3)
Sodium: 138 mEq/L (ref 135–145)

## 2014-08-11 LAB — CBC WITH DIFFERENTIAL/PLATELET
BASOS PCT: 0 % (ref 0–1)
Basophils Absolute: 0 10*3/uL (ref 0.0–0.1)
EOS ABS: 0 10*3/uL (ref 0.0–0.7)
EOS PCT: 0 % (ref 0–5)
HEMATOCRIT: 40.2 % (ref 36.0–46.0)
Hemoglobin: 13.3 g/dL (ref 12.0–15.0)
Lymphocytes Relative: 15 % (ref 12–46)
Lymphs Abs: 2.9 10*3/uL (ref 0.7–4.0)
MCH: 28.4 pg (ref 26.0–34.0)
MCHC: 33.1 g/dL (ref 30.0–36.0)
MCV: 85.7 fL (ref 78.0–100.0)
MONO ABS: 1 10*3/uL (ref 0.1–1.0)
MONOS PCT: 5 % (ref 3–12)
MPV: 10.8 fL (ref 8.6–12.4)
NEUTROS PCT: 80 % — AB (ref 43–77)
Neutro Abs: 15.4 10*3/uL — ABNORMAL HIGH (ref 1.7–7.7)
Platelets: 420 10*3/uL — ABNORMAL HIGH (ref 150–400)
RBC: 4.69 MIL/uL (ref 3.87–5.11)
RDW: 13.5 % (ref 11.5–15.5)
WBC: 19.2 10*3/uL — AB (ref 4.0–10.5)

## 2014-08-13 LAB — CBC WITH DIFFERENTIAL/PLATELET
BASOS ABS: 0.1 10*3/uL (ref 0.0–0.1)
BASOS PCT: 0 % (ref 0–1)
Eosinophils Absolute: 0.2 10*3/uL (ref 0.0–0.7)
Eosinophils Relative: 2 % (ref 0–5)
HEMATOCRIT: 37.4 % (ref 36.0–46.0)
HEMOGLOBIN: 12 g/dL (ref 12.0–15.0)
Lymphocytes Relative: 34 % (ref 12–46)
Lymphs Abs: 5 10*3/uL — ABNORMAL HIGH (ref 0.7–4.0)
MCH: 28.6 pg (ref 26.0–34.0)
MCHC: 32.1 g/dL (ref 30.0–36.0)
MCV: 89 fL (ref 78.0–100.0)
MONOS PCT: 6 % (ref 3–12)
Monocytes Absolute: 0.9 10*3/uL (ref 0.1–1.0)
NEUTROS ABS: 8.5 10*3/uL — AB (ref 1.7–7.7)
Neutrophils Relative %: 58 % (ref 43–77)
Platelets: 457 10*3/uL — ABNORMAL HIGH (ref 150–400)
RBC: 4.2 MIL/uL (ref 3.87–5.11)
RDW: 13 % (ref 11.5–15.5)
WBC: 14.7 10*3/uL — ABNORMAL HIGH (ref 4.0–10.5)

## 2014-08-15 NOTE — Assessment & Plan Note (Signed)
Controlled, no change in medication  

## 2014-08-15 NOTE — Assessment & Plan Note (Signed)
Mild erythema , no exudate on exam

## 2014-08-15 NOTE — Assessment & Plan Note (Signed)
Uncontrolled.Toradol and depo medrol administered IM in the office , to be followed by a short course of oral prednisone and NSAIDS.  

## 2014-08-15 NOTE — Progress Notes (Signed)
Subjective:    Patient ID: Kristin Hall, female    DOB: 12-22-1955, 59 y.o.   MRN: [id number]  HPI Acute onset of fever, chills, sore throat headache and sinus prssure. , had been well prior to this. Symptoms started abruptly 1 day ago.  Denies cough or chest congestion, has left facial pressure, poor appetite and malaise  Review of Systems See HPI Denies chest congestion, productive cough or wheezing. Denies chest pains, palpitations and leg swelling Denies abdominal pain, has poor appetite and  nausea, denies vomiting,diarrhea or constipation.   Denies dysuria, frequency, hesitancy or incontinence. Generalized muscle and joint  pain,  Denies , seizures, numbness, or tingling. Denies depression, anxiety or insomnia. Denies skin break down or rash.        Objective:   Physical Exam BP 110/76 mmHg  Pulse 120  Temp(Src) 102.9 F (39.4 C)  Resp 18  Ht 5\' 3"  (1.6 m)  Wt 170 lb 1.9 oz (77.166 kg)  BMI 30.14 kg/m2  SpO2 96% Patient alert and oriented and in no cardiopulmonary distress.Ill appearing and febrile  HEENT: No facial asymmetry, EOMI,   oropharynx pink and moist.  Neck supple no JVD, no mass. Left maxillary sinus tender, TM clear bilaterally, nasal mucosa erythematous and edematous Chest: Clear to auscultation bilaterally.  CVS: S1, S2 no murmurs, no S3.Regular rate.  ABD: Soft non tender.   Ext: No edema  MS: Adequate ROM spine, shoulders, hips and knees.  Skin: Intact, no ulcerations or rash noted.  Psych: Good eye contact, normal affect. Memory intact not anxious or depressed appearing.  CNS: CN 2-12 intact, power,  normal throughout.no focal deficits noted.        Assessment & Plan:  Maxillary sinusitis, acute Rocephin in office and antibiotic course   Pharyngitis Mild erythema , no exudate on exam   Headache Uncontrolled.Toradol and depo medrol administered IM in the office , to be followed by a short course of oral prednisone and  NSAIDS.    Essential hypertension Controlled, no change in medication

## 2014-09-07 ENCOUNTER — Encounter: Payer: Self-pay | Admitting: Family Medicine

## 2014-09-07 ENCOUNTER — Ambulatory Visit (INDEPENDENT_AMBULATORY_CARE_PROVIDER_SITE_OTHER): Payer: 59 | Admitting: Family Medicine

## 2014-09-07 VITALS — BP 122/82 | HR 78 | Resp 18 | Ht 63.0 in | Wt 171.0 lb

## 2014-09-07 DIAGNOSIS — Z0000 Encounter for general adult medical examination without abnormal findings: Secondary | ICD-10-CM | POA: Diagnosis not present

## 2014-09-07 DIAGNOSIS — Z1211 Encounter for screening for malignant neoplasm of colon: Secondary | ICD-10-CM | POA: Diagnosis not present

## 2014-09-07 DIAGNOSIS — I10 Essential (primary) hypertension: Secondary | ICD-10-CM | POA: Diagnosis not present

## 2014-09-07 DIAGNOSIS — E785 Hyperlipidemia, unspecified: Secondary | ICD-10-CM

## 2014-09-07 LAB — POC HEMOCCULT BLD/STL (OFFICE/1-CARD/DIAGNOSTIC): FECAL OCCULT BLD: NEGATIVE

## 2014-09-07 NOTE — Assessment & Plan Note (Signed)

## 2014-09-07 NOTE — Patient Instructions (Addendum)
F/u in 6 month, call if you need me before  CBc, fasting lipid, cmp and TSh in July, will send you a My chart message  It is important that you exercise regularly at least 30 minutes 5 times a week. If you develop chest pain, have severe difficulty breathing, or feel very tired, stop exercising immediately and seek medical attention   All the best  Thanks for choosing [facility], we consider it a privelige to serve you.

## 2014-09-07 NOTE — Progress Notes (Signed)
Subjective:    Patient ID: Kristin Hall, female    DOB: 10-Mar-1956, 59 y.o.   MRN: [id number]  HPI Patient is in for annual physical exam. No other health concerns are expressed or addressed at the visit. Recent labs, are reviewed. Immunization is reviewed ,    Review of Systems See HPI     Objective:   Physical Exam  BP 122/82 mmHg  Pulse 78  Resp 18  Ht 5\' 3"  (1.6 m)  Wt 171 lb 0.6 oz (77.583 kg)  BMI 30.31 kg/m2  SpO2 98%  Pleasant well nourished female, alert and oriented x 3, in no cardio-pulmonary distress. Afebrile. HEENT No facial trauma or asymetry. Sinuses non tender.  Extra occullar muscles intact, pupils equally reactive to light. External ears normal, tympanic membranes clear. Oropharynx moist, no exudate, fairly  good dentition. Neck: supple, no adenopathy,JVD or thyromegaly.No bruits.  Chest: Clear to ascultation bilaterally.No crackles or wheezes. Non tender to palpation  Breast: No asymetry,no masses or lumps. No tenderness. No nipple discharge or inversion. No axillary or supraclavicular adenopathy  Cardiovascular system; Heart sounds normal,  S1 and  S2 ,no S3.  No murmur, or thrill. Apical beat not displaced Peripheral pulses normal.  Abdomen: Soft, non tender, no organomegaly or masses. No bruits. Bowel sounds normal. No guarding, tenderness or rebound.  Rectal:  Normal sphincter tone. No mass.No rectal masses.  Guaiac negative stool.  GU: External genitalia normal female genitalia , female distribution of hair. No lesions. Urethral meatus normal in size, no  Prolapse, no lesions visibly  Present. Bladder non tender. Vagina pink and moist , with no visible lesions , discharge present . Adequate pelvic support no  cystocele or rectocele noted Cervix pink and appears healthy, no lesions or ulcerations noted, no discharge noted from os Uterus absent ,no adnexal masses, no  adnexal tenderness.   Musculoskeletal exam: Full  ROM of spine, hips , shoulders and knees. No deformity ,swelling or crepitus noted. No muscle wasting or atrophy.   Neurologic: Cranial nerves 2 to 12 intact. Power, tone ,sensation and reflexes normal throughout. No disturbance in gait. No tremor.  Skin: Intact, no ulceration, erythema , scaling or rash noted. Pigmentation normal throughout  Psych; Normal mood and affect. Judgement and concentration normal       Assessment & Plan:  Annual physical exam Annual exam as documented. Counseling done  re healthy lifestyle involving commitment to 150 minutes exercise per week, heart healthy diet, and attaining healthy weight.The importance of adequate sleep also discussed. Regular seat belt use and home safety, is also discussed. Changes in health habits are decided on by the patient with goals and time frames  set for achieving them. Immunization and cancer screening needs are specifically addressed at this visit.

## 2014-10-26 ENCOUNTER — Other Ambulatory Visit: Payer: Self-pay | Admitting: Family Medicine

## 2014-12-14 ENCOUNTER — Encounter: Payer: Self-pay | Admitting: Family Medicine

## 2014-12-14 LAB — COMPREHENSIVE METABOLIC PANEL
ALBUMIN: 4.2 g/dL (ref 3.6–5.1)
ALK PHOS: 129 U/L (ref 33–130)
ALT: 10 U/L (ref 6–29)
AST: 13 U/L (ref 10–35)
BUN: 12 mg/dL (ref 7–25)
CALCIUM: 9.7 mg/dL (ref 8.6–10.4)
CO2: 30 mmol/L (ref 20–31)
Chloride: 101 mmol/L (ref 98–110)
Creat: 0.81 mg/dL (ref 0.50–1.05)
GLUCOSE: 95 mg/dL (ref 65–99)
Potassium: 3.8 mmol/L (ref 3.5–5.3)
Sodium: 140 mmol/L (ref 135–146)
Total Bilirubin: 0.5 mg/dL (ref 0.2–1.2)
Total Protein: 7.2 g/dL (ref 6.1–8.1)

## 2014-12-14 LAB — CBC
HCT: 36.3 % (ref 36.0–46.0)
HEMOGLOBIN: 12.1 g/dL (ref 12.0–15.0)
MCH: 28.2 pg (ref 26.0–34.0)
MCHC: 33.3 g/dL (ref 30.0–36.0)
MCV: 84.6 fL (ref 78.0–100.0)
MPV: 10.3 fL (ref 8.6–12.4)
Platelets: 402 10*3/uL — ABNORMAL HIGH (ref 150–400)
RBC: 4.29 MIL/uL (ref 3.87–5.11)
RDW: 12.7 % (ref 11.5–15.5)
WBC: 8.3 10*3/uL (ref 4.0–10.5)

## 2014-12-14 LAB — LIPID PANEL
CHOL/HDL RATIO: 4.6 ratio (ref <=5.0)
CHOLESTEROL: 170 mg/dL (ref 125–200)
HDL: 37 mg/dL — ABNORMAL LOW (ref >=46)
LDL CALC: 118 mg/dL (ref <130)
TRIGLYCERIDES: 75 mg/dL (ref <150)
VLDL: 15 mg/dL (ref <30)

## 2014-12-14 LAB — TSH: TSH: 1.379 u[IU]/mL (ref 0.350–4.500)

## 2014-12-29 ENCOUNTER — Telehealth: Payer: Self-pay | Admitting: *Deleted

## 2014-12-29 NOTE — Telephone Encounter (Signed)
Pt called stating she has had a deep cough and she has been trying to take robitussin and Motrin for it and it is not helping pt said she may have pulled a muscle because her chest  And side has been hurting. Please advise pt at work.

## 2014-12-30 NOTE — Telephone Encounter (Signed)
Called and left message for patient to return call.  

## 2014-12-31 ENCOUNTER — Encounter: Payer: Self-pay | Admitting: Family Medicine

## 2014-12-31 ENCOUNTER — Ambulatory Visit (INDEPENDENT_AMBULATORY_CARE_PROVIDER_SITE_OTHER): Payer: 59 | Admitting: Family Medicine

## 2014-12-31 VITALS — BP 114/80 | HR 68 | Resp 16 | Ht 63.0 in | Wt 175.4 lb

## 2014-12-31 DIAGNOSIS — I10 Essential (primary) hypertension: Secondary | ICD-10-CM | POA: Diagnosis not present

## 2014-12-31 DIAGNOSIS — K219 Gastro-esophageal reflux disease without esophagitis: Secondary | ICD-10-CM | POA: Diagnosis not present

## 2014-12-31 DIAGNOSIS — R0789 Other chest pain: Secondary | ICD-10-CM

## 2014-12-31 DIAGNOSIS — E669 Obesity, unspecified: Secondary | ICD-10-CM

## 2014-12-31 DIAGNOSIS — E785 Hyperlipidemia, unspecified: Secondary | ICD-10-CM

## 2014-12-31 MED ORDER — PANTOPRAZOLE SODIUM 40 MG IV SOLR: 40.0000 mg | Freq: Two times a day (BID) | INTRAVENOUS | Status: DC

## 2014-12-31 MED ORDER — PANTOPRAZOLE SODIUM 40 MG PO TBEC: 40.0000 mg | DELAYED_RELEASE_TABLET | Freq: Every day | ORAL | Status: DC

## 2014-12-31 MED ORDER — PROMETHAZINE-DM 6.25-15 MG/5ML PO SYRP: 5.0000 mL | ORAL_SOLUTION | Freq: Every evening | ORAL | Status: DC | PRN

## 2014-12-31 MED ORDER — PANTOPRAZOLE SODIUM 40 MG PO TBEC: 40.0000 mg | DELAYED_RELEASE_TABLET | Freq: Two times a day (BID) | ORAL | Status: DC

## 2014-12-31 NOTE — Telephone Encounter (Signed)
Patient will be seen this afternoon

## 2014-12-31 NOTE — Patient Instructions (Addendum)
F/u as before  EKG shows no sign of heart damage  Cough and central chest pain worse when bending over is from reflux  Protonix 40 mg twice daily  For 2 weeks, then one daily indefinitely  Please work on healthy habits  Phenergan DM one teas[poon at bedtime as needed for excessive cough sent to [facility]

## 2015-01-01 ENCOUNTER — Other Ambulatory Visit: Payer: Self-pay | Admitting: Family Medicine

## 2015-01-01 DIAGNOSIS — E669 Obesity, unspecified: Secondary | ICD-10-CM | POA: Insufficient documentation

## 2015-01-01 DIAGNOSIS — R0789 Other chest pain: Secondary | ICD-10-CM | POA: Insufficient documentation

## 2015-01-01 NOTE — Assessment & Plan Note (Signed)
Deteriorated. Patient re-educated about  the importance of commitment to a  minimum of 150 minutes of exercise per week.  The importance of healthy food choices with portion control discussed. Encouraged to start a food diary, count calories and to consider  joining a support group. Sample diet sheets offered. Goals set by the patient for the next several months.   Weight /BMI 12/31/2014 09/07/2014 08/10/2014  WEIGHT 175 lb 6.4 oz 171 lb 0.6 oz 170 lb 1.9 oz  HEIGHT 5\' 3"  5\' 3"  5\' 3"   BMI 31.08 kg/m2 30.31 kg/m2 30.14 kg/m2    Current exercise per week 60 minutes.

## 2015-01-01 NOTE — Progress Notes (Signed)
Kristin Hall     MRN: [id number]      DOB: [birth year]   HPI Kristin Hall is here with a 5 day h/o central chest pain radiating to both sides and down to upper abdomen and under left breast.  Pain started after excessive coughing spell 5 days ago, pain is worse when she ends over. No associated nausea, light headedness, , sweating or vomiting  Has longstanding diagnosis of GERD but has weaned off daily PPI and been taking zantac  ROS Denies recent fever or chills. Denies sinus pressure, nasal congestion, ear pain or sore throat. Denies chest congestion, productive cough or wheezing. Denies PND, orthopnea, palpitations and leg swelling Denies , nausea, vomiting,diarrhea or constipation.   Denies dysuria, frequency, hesitancy or incontinence. Denies joint pain, swelling and limitation in mobility.    PE  BP 114/80 mmHg  Pulse 68  Resp 16  Ht 5\' 3"  (1.6 m)  Wt 175 lb 6.4 oz (79.561 kg)  BMI 31.08 kg/m2  SpO2 99%  Patient alert and oriented and in no cardiopulmonary distress.  HEENT: No facial asymmetry, EOMI,   oropharynx pink and moist.  Neck supple no JVD, no mass.  Chest: Clear to auscultation bilaterally.No re[producible chest wall tenderness  CVS: S1, S2 no murmurs, no S3.Regular rate.  ABD: Soft non tender.   Ext: No edema  MS: Adequate ROM spine, shoulders, hips and knees.  Skin: Intact, no ulcerations or rash noted.  Psych: Good eye contact, normal affect. Memory intact not anxious or depressed appearing.   Assessment & Plan   GERD Uncontrolled with new onset chest pain, resume twice daily PPI for 3 days , then once daily protonix 40 mg  Essential hypertension Controlled, no change in medication DASH diet and commitment to daily physical activity for a minimum of 30 minutes discussed and encouraged, as a part of hypertension management. The importance of attaining a healthy weight is also discussed.  BP/Weight 12/31/2014 09/07/2014 08/10/2014 02/25/2014  01/05/2014 09/22/2013 04/28/2013  Systolic BP 114 122 110 126 142 114 118  Diastolic BP 80 82 76 78 82 70 80  Wt. (Lbs) 175.4 171.04 170.12 179.12 178 172.12 173  BMI 31.08 30.31 30.14 31.74 31.54 30.5 30.65        Atypical chest pain Offic e EKG shows NSR, no ischemia, no LVH  Obesity Deteriorated. Patient re-educated about  the importance of commitment to a  minimum of 150 minutes of exercise per week.  The importance of healthy food choices with portion control discussed. Encouraged to start a food diary, count calories and to consider  joining a support group. Sample diet sheets offered. Goals set by the patient for the next several months.   Weight /BMI 12/31/2014 09/07/2014 08/10/2014  WEIGHT 175 lb 6.4 oz 171 lb 0.6 oz 170 lb 1.9 oz  HEIGHT 5\' 3"  5\' 3"  5\' 3"   BMI 31.08 kg/m2 30.31 kg/m2 30.14 kg/m2    Current exercise per week 60 minutes.   Hyperlipemia Hyperlipidemia:Low fat diet discussed and encouraged.   Lipid Panel  Lab Results  Component Value Date   CHOL 170 12/14/2014   HDL 37* 12/14/2014   LDLCALC 118 12/14/2014   TRIG 75 12/14/2014   CHOLHDL 4.6 12/14/2014   Needs to increase exercise and reduce fat intake

## 2015-01-01 NOTE — Assessment & Plan Note (Signed)
Offic e EKG shows NSR, no ischemia, no LVH

## 2015-01-01 NOTE — Assessment & Plan Note (Signed)
Controlled, no change in medication DASH diet and commitment to daily physical activity for a minimum of 30 minutes discussed and encouraged, as a part of hypertension management. The importance of attaining a healthy weight is also discussed.  BP/Weight 12/31/2014 09/07/2014 08/10/2014 02/25/2014 01/05/2014 09/22/2013 64/33/2951  Systolic BP 884 166 063 016 010 932 355  Diastolic BP 80 82 76 78 82 70 80  Wt. (Lbs) 175.4 171.04 170.12 179.12 178 172.12 173  BMI 31.08 30.31 30.14 31.74 31.54 30.5 30.65

## 2015-01-01 NOTE — Assessment & Plan Note (Signed)
Uncontrolled with new onset chest pain, resume twice daily PPI for 3 days , then once daily protonix 40 mg

## 2015-01-01 NOTE — Assessment & Plan Note (Signed)
Hyperlipidemia:Low fat diet discussed and encouraged.   Lipid Panel  Lab Results  Component Value Date   CHOL 170 12/14/2014   HDL 37* 12/14/2014   LDLCALC 118 12/14/2014   TRIG 75 12/14/2014   CHOLHDL 4.6 12/14/2014   Needs to increase exercise and reduce fat intake

## 2015-03-09 ENCOUNTER — Ambulatory Visit: Payer: 59 | Admitting: Family Medicine

## 2015-04-19 ENCOUNTER — Ambulatory Visit (INDEPENDENT_AMBULATORY_CARE_PROVIDER_SITE_OTHER): Payer: 59 | Admitting: Family Medicine

## 2015-04-19 ENCOUNTER — Other Ambulatory Visit: Payer: Self-pay

## 2015-04-19 ENCOUNTER — Encounter: Payer: Self-pay | Admitting: Family Medicine

## 2015-04-19 VITALS — BP 124/86 | HR 96 | Resp 16 | Ht 63.0 in | Wt 173.0 lb

## 2015-04-19 DIAGNOSIS — E785 Hyperlipidemia, unspecified: Secondary | ICD-10-CM

## 2015-04-19 DIAGNOSIS — N3001 Acute cystitis with hematuria: Secondary | ICD-10-CM

## 2015-04-19 DIAGNOSIS — R05 Cough: Secondary | ICD-10-CM

## 2015-04-19 DIAGNOSIS — K219 Gastro-esophageal reflux disease without esophagitis: Secondary | ICD-10-CM

## 2015-04-19 DIAGNOSIS — R059 Cough, unspecified: Secondary | ICD-10-CM | POA: Insufficient documentation

## 2015-04-19 DIAGNOSIS — I10 Essential (primary) hypertension: Secondary | ICD-10-CM | POA: Diagnosis not present

## 2015-04-19 DIAGNOSIS — E669 Obesity, unspecified: Secondary | ICD-10-CM

## 2015-04-19 DIAGNOSIS — Z1159 Encounter for screening for other viral diseases: Secondary | ICD-10-CM

## 2015-04-19 LAB — POCT URINALYSIS DIPSTICK
Bilirubin, UA: NEGATIVE
Glucose, UA: NEGATIVE
KETONES UA: NEGATIVE
Nitrite, UA: NEGATIVE
PROTEIN UA: NEGATIVE
SPEC GRAV UA: 1.02
Urobilinogen, UA: 0.2
pH, UA: 7

## 2015-04-19 MED ORDER — CIPROFLOXACIN HCL 500 MG PO TABS: 500.0000 mg | ORAL_TABLET | Freq: Two times a day (BID) | ORAL | Status: DC

## 2015-04-19 MED ORDER — HYDROCHLOROTHIAZIDE 25 MG PO TABS: 25.0000 mg | ORAL_TABLET | Freq: Every day | ORAL | Status: DC

## 2015-04-19 MED ORDER — AMLODIPINE BESYLATE 5 MG PO TABS: 5.0000 mg | ORAL_TABLET | Freq: Every day | ORAL | Status: DC

## 2015-04-19 NOTE — Assessment & Plan Note (Signed)
3 month cough, 2nd hand nicotine for 19 years, father died in his 80/s of lung cancer

## 2015-04-19 NOTE — Assessment & Plan Note (Signed)
Controlled, no change in medication  

## 2015-04-19 NOTE — Assessment & Plan Note (Signed)
Hyperlipidemia:Low fat diet discussed and encouraged.   Lipid Panel  Lab Results  Component Value Date   CHOL 170 12/14/2014   HDL 37* 12/14/2014   LDLCALC 118 12/14/2014   TRIG 75 12/14/2014   CHOLHDL 4.6 12/14/2014   Updated lab needed at/ before next visit.

## 2015-04-19 NOTE — Patient Instructions (Addendum)
F/u in 6 weeks, call if you need me sooner  You are treated for UTI. Medication sent to local pharmacy  Return to submit urine for testing on 05/03/2015  STOP valsartan for blood pressure, as I think this is contributing to cough NEW  Are amlodipine 5 mg and HCTZ 25 mg one of each daily Fasting chem 7 and HIV and Hep C   You are referred for screening chest scan  Please work on good  health habits so that your health will improve. 1. Commitment to daily physical activity for 30 to 60  minutes, if you are able to do this.  2. Commitment to wise food choices. Aim for half of your  food intake to be vegetable and fruit, one quarter starchy foods, and one quarter protein. Try to eat on a regular schedule  3 meals per day, snacking between meals should be limited to vegetables or fruits or small portions of nuts. 64 ounces of water per day is generally recommended, unless you have specific health conditions, like heart failure or kidney failure where you will need to limit fluid intake.  3. Commitment to sufficient and a  good quality of physical and mental rest daily, generally between 6 to 8 hours per day.  WITH PERSISTANCE AND PERSEVERANCE, THE IMPOSSIBLE , BECOMES THE NORM! Thanks for choosing [facility], we consider it a privelige to serve you.

## 2015-04-19 NOTE — Assessment & Plan Note (Signed)
Controlled, however , may be experienciong ARB cough, changed to amlodipine, return in 6 weeks DASH diet and commitment to daily physical activity for a minimum of 30 minutes discussed and encouraged, as a part of hypertension management. The importance of attaining a healthy weight is also discussed.  BP/Weight 04/19/2015 12/31/2014 09/07/2014 08/10/2014 02/25/2014 01/05/2014 [id number]  Systolic BP [id number] [id number]  Diastolic BP 86 80 82 76 78 82 70  Wt. (Lbs) 173 175.4 171.04 170.12 179.12 178 172.12  BMI 30.65 31.08 30.31 30.14 31.74 31.54 30.5

## 2015-04-19 NOTE — Progress Notes (Signed)
Subjective:    Patient ID: Kristin Hall, female    DOB: 09/26/1955, 59 y.o.   MRN: [id number]  HPI   Kristin Hall     MRN: [id number]      DOB: July 07, 1955   HPI Ms. Kristin Hall is here for follow up and re-evaluation of chronic medical conditions, medication management and review of any available recent lab and radiology data.  Preventive health is updated, specifically  Cancer screening and Immunization.   The PT denies any adverse reactions to current medications since the last visit.  3 month h/o cough, non productive, concerned about possibility of lung cancerr as her father died from this and she has a 30 pack year h/o 2nd hand exposure from her deceased spouse   ROS Denies recent fever or chills. Denies sinus pressure, nasal congestion, ear pain or sore throat. Denies chest congestion, productive cough or wheezing. Denies chest pains, palpitations and leg swelling Denies abdominal pain, nausea, vomiting,diarrhea or constipation.   3 day h/o dysuria, frequency, denies flank or suprapubic pain, no fever or chills. Denies joint pain, swelling and limitation in mobility. Denies headaches, seizures, numbness, or tingling. Denies depression,  Has increased anxiety due to deterioration in her Mom's health Denies skin break down or rash.   PE  BP 124/86 mmHg  Pulse 96  Resp 16  Ht 5\' 3"  (1.6 m)  Wt 173 lb (78.472 kg)  BMI 30.65 kg/m2  SpO2 96%  Patient alert and oriented and in no cardiopulmonary distress.  HEENT: No facial asymmetry, EOMI,   oropharynx pink and moist.  Neck supple no JVD, no mass.  Chest: Clear to auscultation bilaterally.  CVS: S1, S2 no murmurs, no S3.Regular rate.  ABD: Soft non tender.   Ext: No edema  MS: Adequate ROM spine, shoulders, hips and knees.  Skin: Intact, no ulcerations or rash noted.  Psych: Good eye contact, normal affect. Memory intact not anxious or depressed appearing.  CNS: CN 2-12 intact, power,  normal throughout.no  focal deficits noted.   Assessment & Plan   Cough 3 month cough, 2nd hand nicotine for 19 years, father died in his 80/s of lung cancer  Acute cystitis with hematuria Symptomatic x 3 days with abn uA, presumptive treatment initiated, follow c/s  Essential hypertension Controlled, however , may be experienciong ARB cough, changed to amlodipine, return in 6 weeks DASH diet and commitment to daily physical activity for a minimum of 30 minutes discussed and encouraged, as a part of hypertension management. The importance of attaining a healthy weight is also discussed.  BP/Weight 04/19/2015 12/31/2014 09/07/2014 08/10/2014 02/25/2014 01/05/2014 09/22/2013  Systolic BP 124 114 122 110 126 142 114  Diastolic BP 86 80 82 76 78 82 70  Wt. (Lbs) 173 175.4 171.04 170.12 179.12 178 172.12  BMI 30.65 31.08 30.31 30.14 31.74 31.54 30.5        GERD Controlled, no change in medication   Hyperlipemia Hyperlipidemia:Low fat diet discussed and encouraged.   Lipid Panel  Lab Results  Component Value Date   CHOL 170 12/14/2014   HDL 37* 12/14/2014   LDLCALC 118 12/14/2014   TRIG 75 12/14/2014   CHOLHDL 4.6 12/14/2014   Updated lab needed at/ before next visit.      Obesity Unchanged Patient re-educated about  the importance of commitment to a  minimum of 150 minutes of exercise per week.  The importance of healthy food choices with portion control discussed. Encouraged to start a food diary, count  calories and to consider  joining a support group. Sample diet sheets offered. Goals set by the patient for the next several months.   Weight /BMI 04/19/2015 12/31/2014 09/07/2014  WEIGHT 173 lb 175 lb 6.4 oz 171 lb 0.6 oz  HEIGHT 5\' 3"  5\' 3"  5\' 3"   BMI 30.65 kg/m2 31.08 kg/m2 30.31 kg/m2    Current exercise per week 90 minutes.         Review of Systems     Objective:   Physical Exam        Assessment & Plan:

## 2015-04-19 NOTE — Assessment & Plan Note (Signed)
Symptomatic x 3 days with abn uA, presumptive treatment initiated, follow c/s

## 2015-04-19 NOTE — Assessment & Plan Note (Signed)
Unchanged Patient re-educated about  the importance of commitment to a  minimum of 150 minutes of exercise per week.  The importance of healthy food choices with portion control discussed. Encouraged to start a food diary, count calories and to consider  joining a support group. Sample diet sheets offered. Goals set by the patient for the next several months.   Weight /BMI 04/19/2015 12/31/2014 09/07/2014  WEIGHT 173 lb 175 lb 6.4 oz 171 lb 0.6 oz  HEIGHT 5\' 3"  5\' 3"  5\' 3"   BMI 30.65 kg/m2 31.08 kg/m2 30.31 kg/m2    Current exercise per week 90 minutes.

## 2015-04-20 ENCOUNTER — Other Ambulatory Visit: Payer: Self-pay | Admitting: Family Medicine

## 2015-04-20 DIAGNOSIS — Z1231 Encounter for screening mammogram for malignant neoplasm of breast: Secondary | ICD-10-CM

## 2015-04-21 LAB — URINE CULTURE

## 2015-04-22 ENCOUNTER — Other Ambulatory Visit: Payer: Self-pay | Admitting: Family Medicine

## 2015-04-22 DIAGNOSIS — R05 Cough: Secondary | ICD-10-CM

## 2015-04-22 DIAGNOSIS — R059 Cough, unspecified: Secondary | ICD-10-CM

## 2015-04-23 ENCOUNTER — Ambulatory Visit (HOSPITAL_COMMUNITY): Admission: RE | Admit: 2015-04-23 | Payer: 59 | Source: Ambulatory Visit

## 2015-04-23 ENCOUNTER — Ambulatory Visit (HOSPITAL_COMMUNITY)
Admission: RE | Admit: 2015-04-23 | Discharge: 2015-04-23 | Disposition: A | Discharge status: Home / Self Care | Payer: 59 | Source: Ambulatory Visit | Attending: Family Medicine | Admitting: Family Medicine

## 2015-04-23 DIAGNOSIS — I700 Atherosclerosis of aorta: Secondary | ICD-10-CM | POA: Diagnosis not present

## 2015-04-23 DIAGNOSIS — J9811 Atelectasis: Secondary | ICD-10-CM | POA: Insufficient documentation

## 2015-04-23 DIAGNOSIS — R918 Other nonspecific abnormal finding of lung field: Secondary | ICD-10-CM | POA: Diagnosis not present

## 2015-04-23 DIAGNOSIS — R05 Cough: Secondary | ICD-10-CM | POA: Diagnosis not present

## 2015-04-23 DIAGNOSIS — R059 Cough, unspecified: Secondary | ICD-10-CM

## 2015-05-03 ENCOUNTER — Ambulatory Visit (HOSPITAL_COMMUNITY)
Admission: RE | Admit: 2015-05-03 | Discharge: 2015-05-03 | Disposition: A | Discharge status: Home / Self Care | Payer: 59 | Source: Ambulatory Visit | Attending: Family Medicine | Admitting: Family Medicine

## 2015-05-03 DIAGNOSIS — Z1231 Encounter for screening mammogram for malignant neoplasm of breast: Secondary | ICD-10-CM | POA: Insufficient documentation

## 2015-05-03 LAB — POCT URINALYSIS DIPSTICK
BILIRUBIN UA: NEGATIVE
GLUCOSE UA: NEGATIVE
KETONES UA: NEGATIVE
LEUKOCYTES UA: NEGATIVE
NITRITE UA: NEGATIVE
PH UA: 7.5
Protein, UA: NEGATIVE
Spec Grav, UA: 1.025
Urobilinogen, UA: 0.2

## 2015-05-04 ENCOUNTER — Telehealth (INDEPENDENT_AMBULATORY_CARE_PROVIDER_SITE_OTHER): Payer: 59

## 2015-05-04 DIAGNOSIS — R319 Hematuria, unspecified: Secondary | ICD-10-CM | POA: Diagnosis not present

## 2015-05-04 NOTE — Telephone Encounter (Signed)
Patient aware of results and states that at this time she would like to hold on any referrals.  If she becomes symptomatic again she will contact the office.

## 2015-05-06 NOTE — Telephone Encounter (Signed)
Patient came in for urine check.  Urine checked in office and patient notified of repeat hematuria.   She states that since she is not having any urinary symptoms right now she would like to hold on referral due to holidays.    Urine was discarded.

## 2015-05-06 NOTE — Telephone Encounter (Signed)
noted 

## 2015-05-28 ENCOUNTER — Other Ambulatory Visit: Payer: Self-pay | Admitting: Family Medicine

## 2015-05-28 DIAGNOSIS — Z1159 Encounter for screening for other viral diseases: Secondary | ICD-10-CM | POA: Diagnosis not present

## 2015-05-28 DIAGNOSIS — E785 Hyperlipidemia, unspecified: Secondary | ICD-10-CM | POA: Diagnosis not present

## 2015-05-28 DIAGNOSIS — I10 Essential (primary) hypertension: Secondary | ICD-10-CM | POA: Diagnosis not present

## 2015-05-29 LAB — BASIC METABOLIC PANEL
BUN: 9 mg/dL (ref 7–25)
CO2: 30 mmol/L (ref 20–31)
Calcium: 9.8 mg/dL (ref 8.6–10.4)
Chloride: 101 mmol/L (ref 98–110)
Creat: 0.7 mg/dL (ref 0.50–1.05)
GLUCOSE: 91 mg/dL (ref 65–99)
Potassium: 3.5 mmol/L (ref 3.5–5.3)
Sodium: 140 mmol/L (ref 135–146)

## 2015-05-29 LAB — HIV ANTIBODY (ROUTINE TESTING W REFLEX): HIV 1&2 Ab, 4th Generation: NONREACTIVE

## 2015-05-29 LAB — HEPATITIS C ANTIBODY: HCV Ab: NEGATIVE

## 2015-05-31 ENCOUNTER — Encounter: Payer: Self-pay | Admitting: Family Medicine

## 2015-05-31 ENCOUNTER — Ambulatory Visit (INDEPENDENT_AMBULATORY_CARE_PROVIDER_SITE_OTHER): Payer: 59 | Admitting: Family Medicine

## 2015-05-31 VITALS — BP 140/90 | HR 89 | Resp 16 | Ht 63.0 in | Wt 174.8 lb

## 2015-05-31 DIAGNOSIS — E785 Hyperlipidemia, unspecified: Secondary | ICD-10-CM | POA: Diagnosis not present

## 2015-05-31 DIAGNOSIS — G4700 Insomnia, unspecified: Secondary | ICD-10-CM | POA: Insufficient documentation

## 2015-05-31 DIAGNOSIS — I10 Essential (primary) hypertension: Secondary | ICD-10-CM

## 2015-05-31 DIAGNOSIS — K219 Gastro-esophageal reflux disease without esophagitis: Secondary | ICD-10-CM

## 2015-05-31 DIAGNOSIS — R05 Cough: Secondary | ICD-10-CM | POA: Diagnosis not present

## 2015-05-31 DIAGNOSIS — R059 Cough, unspecified: Secondary | ICD-10-CM

## 2015-05-31 LAB — LIPID PANEL
CHOL/HDL RATIO: 3.7 ratio (ref <=5.0)
CHOLESTEROL: 150 mg/dL (ref 125–200)
HDL: 41 mg/dL — AB (ref >=46)
LDL Cholesterol: 87 mg/dL (ref <130)
Triglycerides: 112 mg/dL (ref <150)
VLDL: 22 mg/dL (ref <30)

## 2015-05-31 MED ORDER — TEMAZEPAM 15 MG PO CAPS: 15.0000 mg | ORAL_CAPSULE | Freq: Every evening | ORAL | Status: DC | PRN

## 2015-05-31 MED ORDER — AMLODIPINE BESYLATE 10 MG PO TABS: 10.0000 mg | ORAL_TABLET | Freq: Every day | ORAL | Status: DC

## 2015-05-31 MED FILL — TEMAZEPAM 15 MG CAPSULE: 15 | 30 days supply | Qty: 30 | Fill #0

## 2015-05-31 MED FILL — AMLODIPINE BESYLATE 10 MG T: 10 | 90 days supply | Qty: 90 | Fill #0

## 2015-05-31 NOTE — Patient Instructions (Addendum)
Nurse bP check in 6 weeks  CPE first week in May  Increase amlodipine to 10 mg daily, OK to take TWO  5 mg tabs together every day till done  Labs are excellent  All the best for 2017!  Thanks for choosing [facility], we consider it a privelige to serve you.   New medication for sleep, also practice good sleep habits  Insomnia Insomnia is a sleep disorder that makes it difficult to fall asleep or to stay asleep. Insomnia can cause tiredness (fatigue), low energy, difficulty concentrating, mood swings, and poor performance at work or school.  There are three different ways to classify insomnia:  Difficulty falling asleep.  Difficulty staying asleep.  Waking up too early in the morning. Any type of insomnia can be long-term (chronic) or short-term (acute). Both are common. Short-term insomnia usually lasts for three months or less. Chronic insomnia occurs at least three times a week for longer than three months. CAUSES  Insomnia may be caused by another condition, situation, or substance, such as:  Anxiety.  Certain medicines.  Gastroesophageal reflux disease (GERD) or other gastrointestinal conditions.  Asthma or other breathing conditions.  Restless legs syndrome, sleep apnea, or other sleep disorders.  Chronic pain.  Menopause. This may include hot flashes.  Stroke.  Abuse of alcohol, tobacco, or illegal drugs.  Depression.  Caffeine.   Neurological disorders, such as Alzheimer disease.  An overactive thyroid (hyperthyroidism). The cause of insomnia may not be known. RISK FACTORS Risk factors for insomnia include:  Gender. Women are more commonly affected than men.  Age. Insomnia is more common as you get older.  Stress. This may involve your professional or personal life.  Income. Insomnia is more common in people with lower income.  Lack of exercise.   Irregular work schedule or night shifts.  Traveling between different time  zones. SIGNS AND SYMPTOMS If you have insomnia, trouble falling asleep or trouble staying asleep is the main symptom. This may lead to other symptoms, such as:  Feeling fatigued.  Feeling nervous about going to sleep.  Not feeling rested in the morning.  Having trouble concentrating.  Feeling irritable, anxious, or depressed. TREATMENT  Treatment for insomnia depends on the cause. If your insomnia is caused by an underlying condition, treatment will focus on addressing the condition. Treatment may also include:   Medicines to help you sleep.  Counseling or therapy.  Lifestyle adjustments. HOME CARE INSTRUCTIONS   Take medicines only as directed by your health care provider.  Keep regular sleeping and waking hours. Avoid naps.  Keep a sleep diary to help you and your health care provider figure out what could be causing your insomnia. Include:   When you sleep.  When you wake up during the night.  How well you sleep.   How rested you feel the next day.  Any side effects of medicines you are taking.  What you eat and drink.   Make your bedroom a comfortable place where it is easy to fall asleep:  Put up shades or special blackout curtains to block light from outside.  Use a white noise machine to block noise.  Keep the temperature cool.   Exercise regularly as directed by your health care provider. Avoid exercising right before bedtime.  Use relaxation techniques to manage stress. Ask your health care provider to suggest some techniques that may work well for you. These may include:  Breathing exercises.  Routines to release muscle tension.  Visualizing peaceful  scenes.  Cut back on alcohol, caffeinated beverages, and cigarettes, especially close to bedtime. These can disrupt your sleep.  Do not overeat or eat spicy foods right before bedtime. This can lead to digestive discomfort that can make it hard for you to sleep.  Limit screen use before  bedtime. This includes:  Watching TV.  Using your smartphone, tablet, and computer.  Stick to a routine. This can help you fall asleep faster. Try to do a quiet activity, brush your teeth, and go to bed at the same time each night.  Get out of bed if you are still awake after 15 minutes of trying to sleep. Keep the lights down, but try reading or doing a quiet activity. When you feel sleepy, go back to bed.  Make sure that you drive carefully. Avoid driving if you feel very sleepy.  Keep all follow-up appointments as directed by your health care provider. This is important. SEEK MEDICAL CARE IF:   You are tired throughout the day or have trouble in your daily routine due to sleepiness.  You continue to have sleep problems or your sleep problems get worse. SEEK IMMEDIATE MEDICAL CARE IF:   You have serious thoughts about hurting yourself or someone else.   This information is not intended to replace advice given to you by your health care provider. Make sure you discuss any questions you have with your health care provider.   Document Released: 04/28/2000 Document Revised: 01/20/2015 Document Reviewed: 01/30/2014 Elsevier Interactive Patient Education [employer].

## 2015-05-31 NOTE — Assessment & Plan Note (Signed)
Controlled, no change in medication  

## 2015-05-31 NOTE — Assessment & Plan Note (Signed)
Resolved with d/c valsartan

## 2015-05-31 NOTE — Assessment & Plan Note (Signed)
Uncontrolled , increase amlodipine to 10 mg dose DASH diet and commitment to daily physical activity for a minimum of 30 minutes discussed and encouraged, as a part of hypertension management. The importance of attaining a healthy weight is also discussed.  BP/Weight 05/31/2015 04/19/2015 12/31/2014 09/07/2014 08/10/2014 02/25/2014 [id number]  Systolic BP [id number]  Diastolic BP 90 86 80 82 76 78 82  Wt. (Lbs) 174.8 173 175.4 171.04 170.12 179.12 178  BMI 30.97 30.65 31.08 30.31 30.14 31.74 31.54

## 2015-05-31 NOTE — Progress Notes (Signed)
Subjective:    Patient ID: Kristin Hall, female    DOB: 1955-08-13, 60 y.o.   MRN: [id number]  HPI   KRYSLYN BITTING     MRN: [id number]      DOB: [birth year]   HPI [name] is here for follow up and re-evaluation of chronic medical conditions, medication management and review of any available recent lab and radiology data.  Preventive health is updated, specifically  Cancer screening and Immunization.    The PT denies any adverse reactions to current medications since the last visit. Cough resolved with change in BP med C/o fatigue and inadequate sleep , wants help for this  ROS Denies recent fever or chills. Denies sinus pressure, nasal congestion, ear pain or sore throat. Denies chest congestion, productive cough or wheezing. Denies chest pains, palpitations and leg swelling Denies abdominal pain, nausea, vomiting,diarrhea or constipation.   Denies dysuria, frequency, hesitancy or incontinence. Denies joint pain, swelling and limitation in mobility. Denies headaches, seizures, numbness, or tingling. Denies depression, c/o poor sleep on avg 5 hrs, increased anxiety / stress , due to declining health of her mom, does have th support of her siblngs caring for her mom Denies skin break down or rash.   PE  BP 140/90 mmHg  Pulse 89  Resp 16  Ht 5\' 3"  (1.6 m)  Wt 174 lb 12.8 oz (79.289 kg)  BMI 30.97 kg/m2  SpO2 96%  Patient alert and oriented and in no cardiopulmonary distress.  HEENT: No facial asymmetry, EOMI,   oropharynx pink and moist.  Neck supple no JVD, no mass.  Chest: Clear to auscultation bilaterally.  CVS: S1, S2 no murmurs, no S3.Regular rate.  ABD: Soft non tender.   Ext: No edema  MS: Adequate ROM spine, shoulders, hips and knees.  Skin: Intact, no ulcerations or rash noted.  Psych: Good eye contact, flat  affect. Memory intact not anxious mildly  depressed appearing.  CNS: CN 2-12 intact, power,  normal throughout.no focal deficits  noted.   Assessment & Plan   Essential hypertension Uncontrolled , increase amlodipine to 10 mg dose DASH diet and commitment to daily physical activity for a minimum of 30 minutes discussed and encouraged, as a part of hypertension management. The importance of attaining a healthy weight is also discussed.  BP/Weight 05/31/2015 04/19/2015 12/31/2014 09/07/2014 08/10/2014 02/25/2014 [id number]  Systolic BP [id number] A999333 [id number]  Diastolic BP 90 86 80 82 76 78 82  Wt. (Lbs) 174.8 173 175.4 171.04 170.12 179.12 178  BMI 30.97 30.65 31.08 30.31 30.14 31.74 31.54        Hyperlipemia Improved, no med change Continue to work on increased exercise to improve HDL Hyperlipidemia:Low fat diet discussed and encouraged.   Lipid Panel  Lab Results  Component Value Date   CHOL 150 05/28/2015   HDL 41* 05/28/2015   LDLCALC 87 05/28/2015   TRIG 112 05/28/2015   CHOLHDL 3.7 05/28/2015        Insomnia Sleep hygiene reviewed and written information offered also. Prescription sent for  medication needed. Often gets 5 hrs per night, now caring along with 3 sibs for her Mom with dementia which is stressful mentally and physically and emotionally, will start restoril  Cough Resolved with d/c valsartan  GERD Controlled, no change in medication        Review of Systems     Objective:   Physical Exam        Assessment & Plan:

## 2015-05-31 NOTE — Assessment & Plan Note (Signed)
Sleep hygiene reviewed and written information offered also. Prescription sent for  medication needed. Often gets 5 hrs per night, now caring along with 3 sibs for her Mom with dementia which is stressful mentally and physically and emotionally, will start restoril

## 2015-05-31 NOTE — Assessment & Plan Note (Signed)
Improved, no med change Continue to work on increased exercise to improve HDL Hyperlipidemia:Low fat diet discussed and encouraged.   Lipid Panel  Lab Results  Component Value Date   CHOL 150 05/28/2015   HDL 41* 05/28/2015   LDLCALC 87 05/28/2015   TRIG 112 05/28/2015   CHOLHDL 3.7 05/28/2015

## 2015-06-03 ENCOUNTER — Other Ambulatory Visit: Payer: Self-pay

## 2015-06-03 ENCOUNTER — Ambulatory Visit (INDEPENDENT_AMBULATORY_CARE_PROVIDER_SITE_OTHER): Payer: 59 | Admitting: Family Medicine

## 2015-06-03 ENCOUNTER — Telehealth: Payer: Self-pay | Admitting: Family Medicine

## 2015-06-03 ENCOUNTER — Encounter: Payer: Self-pay | Admitting: Family Medicine

## 2015-06-03 ENCOUNTER — Ambulatory Visit: Payer: 59 | Admitting: Family Medicine

## 2015-06-03 VITALS — BP 122/84 | HR 89 | Resp 16 | Ht 63.0 in | Wt 176.0 lb

## 2015-06-03 DIAGNOSIS — R079 Chest pain, unspecified: Secondary | ICD-10-CM | POA: Insufficient documentation

## 2015-06-03 DIAGNOSIS — I10 Essential (primary) hypertension: Secondary | ICD-10-CM | POA: Diagnosis not present

## 2015-06-03 DIAGNOSIS — R0789 Other chest pain: Secondary | ICD-10-CM

## 2015-06-03 MED ORDER — VALSARTAN-HYDROCHLOROTHIAZIDE 320-25 MG PO TABS: 1.0000 | ORAL_TABLET | Freq: Every day | ORAL | Status: DC

## 2015-06-03 NOTE — Telephone Encounter (Signed)
Patient states that she has been having chest pain since Tuesday.  Recently in to office and Amlodipine was increased to 10mg .  Was previously on Diovan but was stopped because of cough.  Blood pressure checked while on phone by nurse on the floor and was 160/96.  Please advise.

## 2015-06-03 NOTE — Patient Instructions (Signed)
F/u in 6 to 8 weeks, call if you need me before  EKG is normal  STOP amlodipine, resume diovan /HCTZ as you have already done. sTOP diovan if you develop dry cough

## 2015-06-03 NOTE — Telephone Encounter (Signed)
Spoke with patient and she states that she is not nauseated, no arm pain, no diaphoresis, pain is limited to the center of her chest.  Unable to leave work at the moment.  Will come in at 1pm.

## 2015-06-03 NOTE — Telephone Encounter (Signed)
pt is at work and her chest is hurting. She thinks it is from the medicine Dr. Moshe Cipro prescribed.

## 2015-06-03 NOTE — Telephone Encounter (Signed)
eval today If light headed,nauseated , diaphoretic needs to go to ED

## 2015-06-03 NOTE — Telephone Encounter (Signed)
Seen in office today  

## 2015-06-03 NOTE — Progress Notes (Signed)
Subjective:    Patient ID: Kristin Hall, female    DOB: February 14, 1956, 60 y.o.   MRN: [id number]  HPI Pt in with new c/o chest pain with recent increase in am,lodipine dose, this was done in an attempt to d/c the ARB due to her c/o tickle in her throat and cough as possible allergy. Pain is non radiating , not associated with activity, no nausea or diaphoresis C/o increased anxiety with new chest pain She has independently discontinued the higher dose of amlodipine, and resumed the lower dose   Review of Systems See HPI Denies recent fever or chills. Denies sinus pressure, nasal congestion, ear pain or sore throat. Denies chest congestion, productive cough or wheezing. Denies PND, orthopnea, palpitations and leg swelling Denies abdominal pain, nausea, vomiting,diarrhea or constipation.   Denies dysuria, frequency, hesitancy or incontinence. Denies joint pain, swelling and limitation in mobility. Denies headaches, seizures, numbness, or tingling. . Denies skin break down or rash.        Objective:   Physical Exam BP 122/84 mmHg  Pulse 89  Resp 16  Ht 5\' 3"  (1.6 m)  Wt 176 lb (79.833 kg)  BMI 31.18 kg/m2  SpO2 96% Patient alert and oriented and in no cardiopulmonary distress.Anxious  HEENT: No facial asymmetry, EOMI,   oropharynx pink and moist.  Neck supple no JVD, no mass.  Chest: Clear to auscultation bilaterally.no reproducible chest pain  CVS: S1, S2 no murmurs, no S3.Regular rate. EKG: NSR , no lVH , no ischemia  ABD: Soft non tender.   Ext: No edema  MS: Adequate ROM spine, shoulders, hips and knees.    Psych: Good eye contact, normal affect. Memory intact , anxious, not depressed appearing  CNS: CN 2-12 intact, power,  normal throughout.no focal deficits noted.        Assessment & Plan:  Chest pain New onset chest pain, related to dose increase in amlodipine, normal EKG. Patient advised to d/c amlodipine entirely and resume diovan/hctz as  before If she develops cough/tickle she is to contact me as will need to d/c the ARB  Essential hypertension Controlled, however pt to d/c amlodipine entirely DASH diet and commitment to daily physical activity for a minimum of 30 minutes discussed and encouraged, as a part of hypertension management. The importance of attaining a healthy weight is also discussed.  BP/Weight 06/24/2015 06/07/2015 06/03/2015 05/31/2015 04/19/2015 12/31/2014 09/07/2014  Systolic BP 144 148 122 140 124 114 122  Diastolic BP 90 62 84 90 86 80 82  Wt. (Lbs) 173 175 176 174.8 173 175.4 171.04  BMI 30.65 30.02 31.18 30.97 30.65 31.08 30.31   Return in 6 weeks

## 2015-06-07 ENCOUNTER — Emergency Department (HOSPITAL_COMMUNITY): Payer: 59

## 2015-06-07 ENCOUNTER — Emergency Department (HOSPITAL_COMMUNITY)
Admission: EM | Admit: 2015-06-07 | Discharge: 2015-06-07 | Disposition: A | Discharge status: Home / Self Care | Payer: 59 | Attending: Emergency Medicine | Admitting: Emergency Medicine

## 2015-06-07 ENCOUNTER — Encounter (HOSPITAL_COMMUNITY): Payer: Self-pay | Admitting: *Deleted

## 2015-06-07 DIAGNOSIS — Z79899 Other long term (current) drug therapy: Secondary | ICD-10-CM | POA: Diagnosis not present

## 2015-06-07 DIAGNOSIS — R000 Tachycardia, unspecified: Secondary | ICD-10-CM | POA: Insufficient documentation

## 2015-06-07 DIAGNOSIS — R079 Chest pain, unspecified: Secondary | ICD-10-CM | POA: Diagnosis not present

## 2015-06-07 DIAGNOSIS — Z8639 Personal history of other endocrine, nutritional and metabolic disease: Secondary | ICD-10-CM | POA: Diagnosis not present

## 2015-06-07 DIAGNOSIS — I10 Essential (primary) hypertension: Secondary | ICD-10-CM | POA: Diagnosis not present

## 2015-06-07 LAB — CBC
HCT: 39 % (ref 36.0–46.0)
Hemoglobin: 12.6 g/dL (ref 12.0–15.0)
MCH: 28.5 pg (ref 26.0–34.0)
MCHC: 32.3 g/dL (ref 30.0–36.0)
MCV: 88.2 fL (ref 78.0–100.0)
PLATELETS: 454 10*3/uL — AB (ref 150–400)
RBC: 4.42 MIL/uL (ref 3.87–5.11)
RDW: 13.2 % (ref 11.5–15.5)
WBC: 12.6 10*3/uL — ABNORMAL HIGH (ref 4.0–10.5)

## 2015-06-07 LAB — BASIC METABOLIC PANEL
ANION GAP: 11 (ref 5–15)
BUN: 8 mg/dL (ref 6–20)
CALCIUM: 9.6 mg/dL (ref 8.9–10.3)
CO2: 28 mmol/L (ref 22–32)
Chloride: 103 mmol/L (ref 101–111)
Creatinine, Ser: 0.86 mg/dL (ref 0.44–1.00)
GLUCOSE: 149 mg/dL — AB (ref 65–99)
POTASSIUM: 3.2 mmol/L — AB (ref 3.5–5.1)
Sodium: 142 mmol/L (ref 135–145)

## 2015-06-07 LAB — I-STAT TROPONIN, ED
TROPONIN I, POC: 0 ng/mL (ref 0.00–0.08)
Troponin i, poc: 0.01 ng/mL (ref 0.00–0.08)

## 2015-06-07 LAB — I-STAT CHEM 8, ED
BUN: 6 mg/dL (ref 6–20)
CALCIUM ION: 1.19 mmol/L (ref 1.12–1.23)
Chloride: 101 mmol/L (ref 101–111)
Creatinine, Ser: 0.8 mg/dL (ref 0.44–1.00)
GLUCOSE: 146 mg/dL — AB (ref 65–99)
HCT: 42 % (ref 36.0–46.0)
HEMOGLOBIN: 14.3 g/dL (ref 12.0–15.0)
Potassium: 3.1 mmol/L — ABNORMAL LOW (ref 3.5–5.1)
Sodium: 144 mmol/L (ref 135–145)
TCO2: 29 mmol/L (ref 0–100)

## 2015-06-07 LAB — D-DIMER, QUANTITATIVE (NOT AT ARMC): D DIMER QUANT: 0.56 ug{FEU}/mL — AB (ref 0.00–0.50)

## 2015-06-07 MED ORDER — IOHEXOL 350 MG/ML SOLN
100.0000 mL | Freq: Once | INTRAVENOUS | Status: AC | PRN
  Administered 2015-06-07: 100 mL via INTRAVENOUS

## 2015-06-07 MED ORDER — POTASSIUM CHLORIDE CRYS ER 20 MEQ PO TBCR
40.0000 meq | EXTENDED_RELEASE_TABLET | Freq: Once | ORAL | Status: AC
  Administered 2015-06-07: 40 meq via ORAL
  Filled 2015-06-07: qty 2

## 2015-06-07 MED ORDER — HYDROCODONE-ACETAMINOPHEN 5-325 MG PO TABS: ORAL_TABLET | ORAL | Status: DC

## 2015-06-07 MED ORDER — SODIUM CHLORIDE 0.9 % IV BOLUS (SEPSIS)
1000.0000 mL | Freq: Once | INTRAVENOUS | Status: AC
  Administered 2015-06-07: 1000 mL via INTRAVENOUS

## 2015-06-07 MED ORDER — KETOROLAC TROMETHAMINE 30 MG/ML IJ SOLN
30.0000 mg | Freq: Once | INTRAMUSCULAR | Status: AC
  Administered 2015-06-07: 30 mg via INTRAVENOUS
  Filled 2015-06-07: qty 1

## 2015-06-07 NOTE — ED Notes (Signed)
Pt c/o central chest pain that started x 6 days ago; pt states she has been following up with her PCP regarding her BP meds and  Trying to regulate the medications

## 2015-06-07 NOTE — ED Notes (Signed)
   06/07/15 0628  Chest Pain Assessment  Occurrence 3-7 days ago  Chronicity New  Chest Pain Location Central chest;Radiation to back  pt states chest started last week after her PCP changed BP medications. Went back to PCP EKG done normal. Meds adjusted again, but still have mid chest pains that radiates to her back.

## 2015-06-07 NOTE — Discharge Instructions (Signed)
Follow up with your md this week. °

## 2015-06-07 NOTE — ED Notes (Signed)
Patient with no complaints at this time. Respirations even and unlabored. Skin warm/dry. Discharge instructions reviewed with patient at this time. Patient given opportunity to voice concerns/ask questions. IV removed per policy and band-aid applied to site. Patient discharged at this time and left Emergency Department via wheelchair.  

## 2015-06-07 NOTE — ED Provider Notes (Signed)
TIME SEEN: 6:40 AM  CHIEF COMPLAINT: Chest pain  HPI: Pt is a 60 y.o. female with history of hypertension, hyperlipidemia who presents to the emergency department with complaints of 6 days of diffuse chest pain that she describes as a sharp pain without radiation. No aggravating or relieving factors. No tightness or pressure. She denies that is exertional, pleuritic, worse with movement or palpation, changes with food. No suspicious of breath, nausea or vomiting, diaphoresis or dizziness. She's never had a stress test or cardiac catheterization. No history of PE or DVT. No fever. Reports that she thinks her symptoms started from a change in her blood pressure medication. Reports she saw Dr. Moshe Cipro her PCP on Monday 05/30/14 one week ago and was changed from Diovan because it was causing her to have a dry cough to Norvasc. She states that on Tuesday 05/31/14 she developed chest pain. She went back to her doctor on Thursday 06/02/14 and was changed back to Diovan. She states that her chest pain has still not resolved.  She denies history of diabetes or tobacco use. No family history of CAD.  ROS: See HPI Constitutional: no fever  Eyes: no drainage  ENT: no runny nose   Cardiovascular:   chest pain  Resp: no SOB  GI: no vomiting GU: no dysuria Integumentary: no rash  Allergy: no hives  Musculoskeletal: no leg swelling  Neurological: no slurred speech ROS otherwise negative  PAST MEDICAL HISTORY/PAST SURGICAL HISTORY:  Past Medical History  Diagnosis Date  . Hyperlipidemia   . Hypertension   . Elevated alkaline phosphatase level     MEDICATIONS:  Prior to Admission medications   Medication Sig Start Date End Date Taking? Authorizing Provider  Multiple Vitamins-Minerals (ALIVE WOMENS 50+) TABS Take 1 tablet by mouth daily.    Historical Provider, MD  pantoprazole (PROTONIX) 40 MG tablet Take 1 tablet (40 mg total) by mouth daily. 12/31/14   Fayrene Helper, MD  potassium chloride SA  (K-DUR,KLOR-CON) 20 MEQ tablet TAKE 1 TABLET BY MOUTH DAILY. 08/04/14   Fayrene Helper, MD  TAZTIA XT 300 MG 24 hr capsule TAKE 1 CAPSULE BY MOUTH ONCE DAILY Patient not taking: Reported on 06/03/2015 10/26/14   Fayrene Helper, MD  temazepam (RESTORIL) 15 MG capsule Take 1 capsule (15 mg total) by mouth at bedtime as needed for sleep. Patient not taking: Reported on 06/03/2015 05/31/15   Fayrene Helper, MD  valsartan-hydrochlorothiazide (DIOVAN HCT) 320-25 MG tablet Take 1 tablet by mouth daily. 06/03/15   Fayrene Helper, MD  VYTORIN 10-10 MG per tablet TAKE 1 TABLET BY MOUTH AT BEDTIME FOR CHOLESTEROL. 08/04/14   Fayrene Helper, MD    ALLERGIES:  Allergies  Allergen Reactions  . Aspirin Other (See Comments)    Generalized aches  . Metronidazole   . Valsartan Cough    SOCIAL HISTORY:  Social History  Substance Use Topics  . Smoking status: Never Smoker   . Smokeless tobacco: Not on file  . Alcohol Use: No    FAMILY HISTORY: Family History  Problem Relation Age of Onset  . Colon cancer Neg Hx   . Liver disease Neg Hx     EXAM: BP 154/78 mmHg  Pulse 113  Temp(Src) 98.2 F (36.8 C) (Oral)  Resp 20  Ht 5\' 4"  (1.626 m)  Wt 175 lb (79.379 kg)  BMI 30.02 kg/m2  SpO2 97% CONSTITUTIONAL: Alert and oriented and responds appropriately to questions. Well-appearing; well-nourished HEAD: Normocephalic EYES: Conjunctivae clear, PERRL  ENT: normal nose; no rhinorrhea; moist mucous membranes; pharynx without lesions noted NECK: Supple, no meningismus, no LAD  CARD: Regular and tachycardic; S1 and S2 appreciated; no murmurs, no clicks, no rubs, no gallops CHEST:  Chest wall nontender to palpation without crepitus, ecchymosis or deformity, no rash or other lesions RESP: Normal chest excursion without splinting or tachypnea; breath sounds clear and equal bilaterally; no wheezes, no rhonchi, no rales, no hypoxia or respiratory distress, speaking full sentences ABD/GI: Normal  bowel sounds; non-distended; soft, non-tender, no rebound, no guarding, no peritoneal signs BACK:  The back appears normal and is non-tender to palpation, there is no CVA tenderness EXT: Normal ROM in all joints; non-tender to palpation; no edema; normal capillary refill; no cyanosis, no calf tenderness or swelling    SKIN: Normal color for age and race; warm NEURO: Moves all extremities equally, sensation to light touch intact diffusely, cranial nerves II through XII intact PSYCH: The patient's mood and manner are appropriate. Grooming and personal hygiene are appropriate.  MEDICAL DECISION MAKING: Patient here with chest pain. She does have risk factors for ACS but chest pain does sound very atypical. It is not exertional or pleuritic. She is however tachycardic in the emergency department. EKG shows no ischemic changes or interval changes. Labs show potassium of 3.2. We'll replace. She does have a mild leukocytosis but denies any infectious symptoms. First troponin negative. Plan is to repeat second troponin 3 hours. Chest x-ray, d-dimer also pending. Anticipate if workup is negative and patient is feeling better that she can be discharged home with outpatient follow-up. Her PCP is Dr. Moshe Cipro.  ED PROGRESS: Patient's d-dimer is slightly positive. We'll obtain CT of her chest to rule out pulmonary embolus. Second troponin pending at 9:30 AM. Signed out to Dr. Roderic Palau who will follow-up on CT imaging and second troponin.    EKG Interpretation  Date/Time:  Monday June 07 2015 06:22:30 EST Ventricular Rate:  113 PR Interval:  180 QRS Duration: 97 QT Interval:  345 QTC Calculation: 473 R Axis:   71 Text Interpretation:  Sinus tachycardia Probable left atrial enlargement Baseline wander in lead(s) II III aVF No significant change since last tracing other than rate is faster Confirmed by Dayn Barich,  DO, Virdia Ziesmer [id number]) on 06/07/2015 6:30:05 AM        Andrew, DO 06/07/15 [id number]

## 2015-06-21 ENCOUNTER — Encounter: Payer: Self-pay | Admitting: Gastroenterology

## 2015-06-21 ENCOUNTER — Telehealth: Payer: Self-pay | Admitting: Family Medicine

## 2015-06-21 DIAGNOSIS — R1013 Epigastric pain: Secondary | ICD-10-CM

## 2015-06-21 DIAGNOSIS — R079 Chest pain, unspecified: Secondary | ICD-10-CM

## 2015-06-21 NOTE — Telephone Encounter (Signed)
Called patient and left message for them to return call at the office   

## 2015-06-21 NOTE — Telephone Encounter (Signed)
Patient referred per Dr to GI and Cardio

## 2015-06-21 NOTE — Telephone Encounter (Signed)
Patient is stating that she has started back hurting again Saturday in her upper chest across the shoulder blades, She states that she has taken all the pain medication and it helped until it ran out, please advise?

## 2015-06-21 NOTE — Telephone Encounter (Signed)
States she has taken all of her pain meds and she is still hurting all across her upper chest area. Rates pain 10/10. Burning pain. States it all started when she was switched to the norvasc but she has been off of that for awhile but the pain isn't going away. Will refer to GI and cardiology per Dr

## 2015-06-24 ENCOUNTER — Ambulatory Visit: Payer: 59 | Admitting: Cardiovascular Disease

## 2015-06-24 ENCOUNTER — Encounter: Payer: Self-pay | Admitting: Cardiovascular Disease

## 2015-06-24 ENCOUNTER — Ambulatory Visit (INDEPENDENT_AMBULATORY_CARE_PROVIDER_SITE_OTHER): Payer: 59 | Admitting: Cardiovascular Disease

## 2015-06-24 VITALS — BP 144/90 | HR 105 | Ht 63.0 in | Wt 173.0 lb

## 2015-06-24 DIAGNOSIS — D473 Essential (hemorrhagic) thrombocythemia: Secondary | ICD-10-CM

## 2015-06-24 DIAGNOSIS — R079 Chest pain, unspecified: Secondary | ICD-10-CM

## 2015-06-24 DIAGNOSIS — E876 Hypokalemia: Secondary | ICD-10-CM | POA: Diagnosis not present

## 2015-06-24 DIAGNOSIS — D75839 Thrombocytosis, unspecified: Secondary | ICD-10-CM

## 2015-06-24 DIAGNOSIS — I10 Essential (primary) hypertension: Secondary | ICD-10-CM

## 2015-06-24 MED ORDER — POTASSIUM CHLORIDE CRYS ER 20 MEQ PO TBCR: 40.0000 meq | EXTENDED_RELEASE_TABLET | Freq: Every day | ORAL | Status: DC

## 2015-06-24 MED FILL — POTASSIUM CL ER 20 MEQ TAB: 20 | 90 days supply | Qty: 180 | Fill #0

## 2015-06-24 NOTE — Progress Notes (Signed)
Patient ID: ABRIELLA STEAGALL, female   DOB: [birth year], 60 y.o.   MRN: [id number]       CARDIOLOGY CONSULT NOTE  Patient ID: TAIESHA CARE MRN: [id number] DOB/AGE: [birth year] 60 y.o.  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 She has a past medical history significant for hypertension, hyperlipidemia, and anxiety. She has a lot of social stressors at home. She had been on amlodipine for hypertension and is now on Diovan due to a reported cough. Normal renal artery Dopplers on 10/03/13.  She was evaluated in the ED for chest pain on 06/07/15. Troponin was normal. She was hypokalemic (3.1). WBC mildly elevated at 12.6. Platelets were elevated at 454 and she has been mildly thrombocythemic dating at least back to 05/19/14.  CT angiography was negative for pulmonary embolism.  ECG showed sinus tachycardia, HR 113 bpm.  She had not experienced chest pain before until she believes her amlodipine was increased to 10 mg daily. However, after it was stopped altogether, she has continued to have chest pain. It is located in the upper bilateral chest regions and in the retrosternal region. It is tender to palpation. It is not affected by exertion. She denies leg swelling, orthopnea, and paroxysmal nocturnal dyspnea. Also denies lightheadedness, dizziness, and syncope.  No h/o anemia. Has GERD controlled with Protonix.    Allergies  Allergen Reactions  . Amlodipine Other (See Comments)    Chest pain  . Aspirin Other (See Comments)    Generalized aches  . Metronidazole Hives and Itching  . Valsartan Cough    Current Outpatient Prescriptions  Medication Sig Dispense Refill  . aspirin 325 MG tablet Take 325 mg by mouth daily as needed for mild pain.    Marland Kitchen HYDROcodone-acetaminophen (NORCO/VICODIN) 5-325 MG tablet Take one every 6-8 hours as needed  for pain 15 tablet 0  . ibuprofen (ADVIL,MOTRIN) 200 MG tablet Take 200 mg by mouth every 6 (six) hours as needed for moderate pain.    . Multiple Vitamins-Minerals (ALIVE WOMENS 50+) TABS Take 1 tablet by mouth daily.    . pantoprazole (PROTONIX) 40 MG tablet Take 1 tablet (40 mg total) by mouth daily. 90 tablet 3  . potassium chloride SA (K-DUR,KLOR-CON) 20 MEQ tablet TAKE 1 TABLET BY MOUTH DAILY. 90 tablet 5  . TAZTIA XT 300 MG 24 hr capsule TAKE 1 CAPSULE BY MOUTH ONCE DAILY 90 capsule 3  . temazepam (RESTORIL) 15 MG capsule Take 1 capsule (15 mg total) by mouth at bedtime as needed for sleep. 30 capsule 3  . valsartan-hydrochlorothiazide (DIOVAN HCT) 320-25 MG tablet Take 1 tablet by mouth daily. 90 tablet 1  . VYTORIN 10-10 MG per tablet TAKE 1 TABLET BY MOUTH AT BEDTIME FOR CHOLESTEROL. 30 tablet 5  . [DISCONTINUED] amLODipine (NORVASC) 10 MG tablet Take 1 tablet (10 mg total) by mouth daily. 90 tablet 1   No current facility-administered medications for this visit.    Past Medical History  Diagnosis Date  . Hyperlipidemia   . Hypertension   . Elevated alkaline phosphatase level     Past Surgical History  Procedure Laterality Date  . Abdominal hysterectomy      partial   . Colonoscopy  10/04/2006    SLF: Normal retroflexed view of the rectum/Normal colon without evidence of polyps, masses, inflammatory changes, diverticula or arteriovenous  malformations    Social History   Social History  . Marital Status: Widowed    Spouse Name: N/A  . Number of Children: N/A  . Years of Education: N/A   Occupational History  . Not on file.   Social History Main Topics  . Smoking status: Never Smoker   . Smokeless tobacco: Not on file  . Alcohol Use: No  . Drug Use: No  . Sexual Activity: Yes   Other Topics Concern  . Not on file   Social History Narrative     No family history of premature CAD in 1st degree relatives.  Prior to Admission medications   Medication Sig  Start Date End Date Taking? Authorizing Provider  aspirin 325 MG tablet Take 325 mg by mouth daily as needed for mild pain.   Yes Historical Provider, MD  HYDROcodone-acetaminophen (NORCO/VICODIN) 5-325 MG tablet Take one every 6-8 hours as needed for pain 06/07/15  Yes Bethann Berkshire, MD  ibuprofen (ADVIL,MOTRIN) 200 MG tablet Take 200 mg by mouth every 6 (six) hours as needed for moderate pain.   Yes Historical Provider, MD  Multiple Vitamins-Minerals (ALIVE WOMENS 50+) TABS Take 1 tablet by mouth daily.   Yes Historical Provider, MD  pantoprazole (PROTONIX) 40 MG tablet Take 1 tablet (40 mg total) by mouth daily. 12/31/14  Yes Kerri Perches, MD  potassium chloride SA (K-DUR,KLOR-CON) 20 MEQ tablet TAKE 1 TABLET BY MOUTH DAILY. 08/04/14  Yes Kerri Perches, MD  TAZTIA XT 300 MG 24 hr capsule TAKE 1 CAPSULE BY MOUTH ONCE DAILY 10/26/14  Yes Kerri Perches, MD  temazepam (RESTORIL) 15 MG capsule Take 1 capsule (15 mg total) by mouth at bedtime as needed for sleep. 05/31/15  Yes Kerri Perches, MD  valsartan-hydrochlorothiazide (DIOVAN HCT) 320-25 MG tablet Take 1 tablet by mouth daily. 06/03/15  Yes Kerri Perches, MD  VYTORIN 10-10 MG per tablet TAKE 1 TABLET BY MOUTH AT BEDTIME FOR CHOLESTEROL. 08/04/14  Yes Kerri Perches, MD     Review of systems complete and found to be negative unless listed above in HPI     Physical exam Blood pressure 144/90, pulse 105, height 5\' 3"  (1.6 m), weight 173 lb (78.472 kg), SpO2 97 %. General: NAD Neck: No JVD, no thyromegaly or thyroid nodule.  Lungs: Clear to auscultation bilaterally with normal respiratory effort. CV: Nondisplaced PMI. +chest wall tenderness. Mildly tachycardic, regular rhythm, normal S1/S2, no S3/S4, no murmur.  No peripheral edema.  No carotid bruit.   Abdomen: Soft, nontender, no distention.  Skin: Intact without lesions or rashes.  Neurologic: Alert and oriented x 3.  Psych: Normal affect. Extremities: No  clubbing or cyanosis.  HEENT: Normal.   ECG: Most recent ECG reviewed.  Labs:   Lab Results  Component Value Date   WBC 12.6* 06/07/2015   HGB 14.3 06/07/2015   HCT 42.0 06/07/2015   MCV 88.2 06/07/2015   PLT 454* 06/07/2015   No results for input(s): NA, K, CL, CO2, BUN, CREATININE, CALCIUM, PROT, BILITOT, ALKPHOS, ALT, AST, GLUCOSE in the last 168 hours.  Invalid input(s): LABALBU No results found for: CKTOTAL, CKMB, CKMBINDEX, TROPONINI  Lab Results  Component Value Date   CHOL 150 05/28/2015   CHOL 170 12/14/2014   CHOL 147 05/19/2014   Lab Results  Component Value Date   HDL 41* 05/28/2015   HDL 37* 12/14/2014   HDL 42 05/19/2014   Lab Results  Component Value Date   LDLCALC 87  05/28/2015   LDLCALC 118 12/14/2014   LDLCALC 89 05/19/2014   Lab Results  Component Value Date   TRIG 112 05/28/2015   TRIG 75 12/14/2014   TRIG 81 05/19/2014   Lab Results  Component Value Date   CHOLHDL 3.7 05/28/2015   CHOLHDL 4.6 12/14/2014   CHOLHDL 3.5 05/19/2014   No results found for: LDLDIRECT       Studies: No results found.  ASSESSMENT AND PLAN:  1. Chest pain: I do not think this is cardiac in etiology. She denies exertional symptoms altogether. She has chest wall tenderness and pain is exacerbated with bending over and then standing upright. Appears musculoskeletal in etiology. Timing of chest pain appears to coincide with development of hypokalemia (K 3.1). Will increase KCl to 40 meq daily and check BMET on 06/28/15.  I do not feel noninvasive cardiac testing is warranted at this time.  2. Essential HTN: Had been previously controlled but she believes it has been aggravated by chest pain. Medical management as noted above.  3. Hypokalemia: 3.1, and likely the result of HCTZ. Will increase KCl to 40 meq daily and check BMET on 06/28/15.   4. Thrombocytosis: No obvious anemia. Platelets were elevated at 454 on 06/07/15 and she has been mildly thrombocythemic  dating at least back to 05/19/14. I will have her evaluated by Dr. Galen Manila (hematology) to see if obtaining a peripheral smear or additional studies are warranted.  Dispo: f/u to be determined.   Signed: Prentice Docker, M.D., F.A.C.C.  06/24/2015, 1:08 PM

## 2015-06-24 NOTE — Patient Instructions (Addendum)
Your physician recommends that you schedule a follow-up appointment in: to be determined   You have been referred to hematology, Dr.Penland    INCREASE Potassium to 40 meq daily    If you need a refill on your cardiac medications before your next appointment, please call your pharmacy.  Labs:BMET on Monday   Thank you for choosing [facility] !

## 2015-06-26 NOTE — Assessment & Plan Note (Signed)
Controlled, however pt to d/c amlodipine entirely DASH diet and commitment to daily physical activity for a minimum of 30 minutes discussed and encouraged, as a part of hypertension management. The importance of attaining a healthy weight is also discussed.  BP/Weight 06/24/2015 06/07/2015 06/03/2015 05/31/2015 04/19/2015 12/31/2014 123456  Systolic BP 123456 123456 [id number]  Diastolic BP 90 62 84 90 86 80 82  Wt. (Lbs) 173 175 176 174.8 173 175.4 171.04  BMI 30.65 30.02 31.18 30.97 30.65 31.08 30.31   Return in 6 weeks

## 2015-06-26 NOTE — Assessment & Plan Note (Signed)
New onset chest pain, related to dose increase in amlodipine, normal EKG. Patient advised to d/c amlodipine entirely and resume diovan/hctz as before If she develops cough/tickle she is to contact me as will need to d/c the ARB

## 2015-06-28 DIAGNOSIS — D473 Essential (hemorrhagic) thrombocythemia: Secondary | ICD-10-CM | POA: Diagnosis not present

## 2015-06-28 LAB — BASIC METABOLIC PANEL
BUN: 11 mg/dL (ref 7–25)
CALCIUM: 10.1 mg/dL (ref 8.6–10.4)
CO2: 30 mmol/L (ref 20–31)
Chloride: 104 mmol/L (ref 98–110)
Creat: 0.68 mg/dL (ref 0.50–1.05)
Glucose, Bld: 88 mg/dL (ref 65–99)
Potassium: 4.2 mmol/L (ref 3.5–5.3)
SODIUM: 141 mmol/L (ref 135–146)

## 2015-07-05 ENCOUNTER — Ambulatory Visit: Payer: 59 | Admitting: Gastroenterology

## 2015-07-12 MED FILL — PANTOPRAZOLE SOD DR 40 MG T: 40 | 90 days supply | Qty: 90 | Fill #2

## 2015-07-12 MED FILL — VALSARTAN-HCTZ 320-25 MG TA: 320-25 | 90 days supply | Qty: 90 | Fill #2

## 2015-07-12 MED FILL — DILTIAZEM HCL ER 300 MG CAP: 300 | 90 days supply | Qty: 90 | Fill #3

## 2015-07-14 ENCOUNTER — Ambulatory Visit (HOSPITAL_COMMUNITY): Payer: 59 | Admitting: Oncology

## 2015-07-23 ENCOUNTER — Ambulatory Visit (HOSPITAL_COMMUNITY): Payer: 59 | Admitting: Oncology

## 2015-07-23 ENCOUNTER — Other Ambulatory Visit (HOSPITAL_COMMUNITY)
Admission: RE | Admit: 2015-07-23 | Discharge: 2015-07-23 | Disposition: A | Discharge status: Home / Self Care | Payer: 59 | Source: Ambulatory Visit | Attending: Hematology & Oncology | Admitting: Hematology & Oncology

## 2015-07-23 ENCOUNTER — Encounter (HOSPITAL_COMMUNITY): Payer: 59 | Attending: Oncology | Admitting: Oncology

## 2015-07-23 ENCOUNTER — Encounter (HOSPITAL_COMMUNITY): Payer: Self-pay | Admitting: Oncology

## 2015-07-23 ENCOUNTER — Encounter (HOSPITAL_COMMUNITY): Payer: 59

## 2015-07-23 VITALS — BP 135/76 | HR 95 | Temp 98.9°F | Resp 18 | Wt 175.3 lb

## 2015-07-23 DIAGNOSIS — I10 Essential (primary) hypertension: Secondary | ICD-10-CM | POA: Diagnosis not present

## 2015-07-23 DIAGNOSIS — K219 Gastro-esophageal reflux disease without esophagitis: Secondary | ICD-10-CM | POA: Insufficient documentation

## 2015-07-23 DIAGNOSIS — D473 Essential (hemorrhagic) thrombocythemia: Secondary | ICD-10-CM

## 2015-07-23 DIAGNOSIS — Z888 Allergy status to other drugs, medicaments and biological substances status: Secondary | ICD-10-CM | POA: Insufficient documentation

## 2015-07-23 DIAGNOSIS — E669 Obesity, unspecified: Secondary | ICD-10-CM | POA: Insufficient documentation

## 2015-07-23 DIAGNOSIS — Z809 Family history of malignant neoplasm, unspecified: Secondary | ICD-10-CM | POA: Insufficient documentation

## 2015-07-23 DIAGNOSIS — K760 Fatty (change of) liver, not elsewhere classified: Secondary | ICD-10-CM | POA: Insufficient documentation

## 2015-07-23 DIAGNOSIS — Z79899 Other long term (current) drug therapy: Secondary | ICD-10-CM | POA: Diagnosis not present

## 2015-07-23 DIAGNOSIS — Z90710 Acquired absence of both cervix and uterus: Secondary | ICD-10-CM | POA: Diagnosis not present

## 2015-07-23 DIAGNOSIS — D75839 Thrombocytosis, unspecified: Secondary | ICD-10-CM | POA: Insufficient documentation

## 2015-07-23 DIAGNOSIS — E785 Hyperlipidemia, unspecified: Secondary | ICD-10-CM | POA: Insufficient documentation

## 2015-07-23 HISTORY — DX: Thrombocytosis, unspecified: D75.839

## 2015-07-23 LAB — CBC WITH DIFFERENTIAL/PLATELET
BASOS ABS: 0 10*3/uL (ref 0.0–0.1)
Basophils Relative: 0 %
EOS PCT: 1 %
Eosinophils Absolute: 0.1 10*3/uL (ref 0.0–0.7)
HCT: 37.8 % (ref 36.0–46.0)
Hemoglobin: 12.3 g/dL (ref 12.0–15.0)
LYMPHS PCT: 26 %
Lymphs Abs: 2.9 10*3/uL (ref 0.7–4.0)
MCH: 28.7 pg (ref 26.0–34.0)
MCHC: 32.5 g/dL (ref 30.0–36.0)
MCV: 88.1 fL (ref 78.0–100.0)
MONO ABS: 0.6 10*3/uL (ref 0.1–1.0)
MONOS PCT: 5 %
Neutro Abs: 7.4 10*3/uL (ref 1.7–7.7)
Neutrophils Relative %: 68 %
PLATELETS: 384 10*3/uL (ref 150–400)
RBC: 4.29 MIL/uL (ref 3.87–5.11)
RDW: 13.1 % (ref 11.5–15.5)
WBC: 10.9 10*3/uL — ABNORMAL HIGH (ref 4.0–10.5)

## 2015-07-23 LAB — SEDIMENTATION RATE: Sed Rate: 30 mm/hr — ABNORMAL HIGH (ref 0–22)

## 2015-07-23 NOTE — Progress Notes (Signed)
[facility] Hematology/Oncology Consultation   Name: Kristin Hall      MRN: [id number]    Location: Room/bed info not found  Date: 07/23/2015 Time:3:51 PM   REFERRING PHYSICIAN:  Harriet Butte, MD (Cardiology)  REASON FOR CONSULT:  Thrombocytosis   DIAGNOSIS:  Minimal thrombocytosis dating back to at least 2010  HISTORY OF PRESENT ILLNESS:   Kristin Hall is a pleasant 60 year old black American with a past medical history significant for hyperlipidemia, hypertension, GERD, obesity, hypokinesia of the esophagus, who is referred to [place] for a mild thrombocytosis and on chart review it dates back to at least 2010.  I personally reviewed and went over laboratory results with the patient.  The results are noted within this dictation.  Thrombocytosis dates back to at least 06/11/2008 at which time platelet count was 401,000. Over the next 7 years, she's had instances of upper limits of normal count with consistent mild thrombocytosis as high as 457,000. Additionally, she has preservation of white blood cell counts, hemoglobin, RBC, and hematocrit.  I personally reviewed and went over radiographic studies with the patient.  The results are noted within this dictation.  She is up-to-date on mammography. Last mammogram was 05/04/2015 at which time it was BI-RADS Category 1. Also of note, she has an ultrasound of the abdomen shows hepatic steatosis.  Chart reviewed. She reports a recent history of chest pain. She hasn't seen cardiology, Dr. Harriet Butte.  She was noted to have hypokalemia and as a result, cardiology has worked with her with regards to potassium replacement. Since potassium replacement, her chest pains has resolved. Cardiac workup for his pain was negative.  Patient denies any headaches, dizziness, double vision, fevers, chills, night sweats, unintentional weight loss, change in appetite, chest pain, abdominal pain, black stools, blood in  stools, urinary complaints, hematuria, blood clots, family history of abnormal blood counts, family history of cancer.  She reports that she feels very well.  She is educated on some causes of minimal thrombocytosis. Thrombocytosis is been stable since at least 2010 without significant elevation. Her highest platelet count is 457,000 over the past 7 years.  PAST MEDICAL HISTORY:   Past Medical History  Diagnosis Date  . Hyperlipidemia   . Hypertension   . Elevated alkaline phosphatase level   . Thrombocytosis ([place]) 07/23/2015    ALLERGIES: Allergies  Allergen Reactions  . Amlodipine Other (See Comments)    Chest pain  . Aspirin Other (See Comments)    Generalized aches  . Metronidazole Hives and Itching  . Valsartan Cough      MEDICATIONS: I have reviewed the patient's current medications.    Current Outpatient Prescriptions on File Prior to Visit  Medication Sig Dispense Refill  . Multiple Vitamins-Minerals (ALIVE WOMENS 50+) TABS Take 1 tablet by mouth daily.    . pantoprazole (PROTONIX) 40 MG tablet Take 1 tablet (40 mg total) by mouth daily. 90 tablet 3  . potassium chloride SA (K-DUR,KLOR-CON) 20 MEQ tablet Take 2 tablets (40 mEq total) by mouth daily. 180 tablet 3  . TAZTIA XT 300 MG 24 hr capsule TAKE 1 CAPSULE BY MOUTH ONCE DAILY 90 capsule 3  . temazepam (RESTORIL) 15 MG capsule Take 1 capsule (15 mg total) by mouth at bedtime as needed for sleep. 30 capsule 3  . valsartan-hydrochlorothiazide (DIOVAN HCT) 320-25 MG tablet Take 1 tablet by mouth daily. 90 tablet 1  . VYTORIN 10-10 MG per tablet TAKE  1 TABLET BY MOUTH AT BEDTIME FOR CHOLESTEROL. 30 tablet 5  . aspirin 325 MG tablet Take 325 mg by mouth daily as needed for mild pain. Reported on 07/23/2015    . HYDROcodone-acetaminophen (NORCO/VICODIN) 5-325 MG tablet Take one every 6-8 hours as needed for pain (Patient not taking: Reported on 07/23/2015) 15 tablet 0  . ibuprofen (ADVIL,MOTRIN) 200 MG tablet Take 200 mg by  mouth every 6 (six) hours as needed for moderate pain. Reported on 07/23/2015    . [DISCONTINUED] amLODipine (NORVASC) 10 MG tablet Take 1 tablet (10 mg total) by mouth daily. 90 tablet 1   No current facility-administered medications on file prior to visit.     PAST SURGICAL HISTORY Past Surgical History  Procedure Laterality Date  . Abdominal hysterectomy      partial   . Colonoscopy  10/04/2006    SLF: Normal retroflexed view of the rectum/Normal colon without evidence of polyps, masses, inflammatory changes, diverticula or arteriovenous malformations    FAMILY HISTORY: Family History  Problem Relation Age of Onset  . Colon cancer Neg Hx   . Liver disease Neg Hx   . Cancer Mother     Breast  . Dementia Mother   . Cancer Father     Lung Cancer, deceased at 34    SOCIAL HISTORY:  reports that she has never smoked. She has never used smokeless tobacco. She reports that she does not drink alcohol or use illicit drugs. 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  PERFORMANCE STATUS: The patient's performance status is 0 - Asymptomatic  PHYSICAL EXAM: Most Recent Vital Signs: Blood pressure 135/76, pulse 95, temperature 98.9 F (37.2 C), temperature source Oral, resp. rate 18, weight 175 lb 4.8 oz (79.516 kg), SpO2 96 %. BP 135/76 mmHg  Pulse 95  Temp(Src) 98.9 F (37.2 C) (Oral)  Resp 18  Wt 175 lb 4.8 oz (79.516 kg)  SpO2 96%  General Appearance:    Alert, cooperative, no distress, appears stated age  Head:    Normocephalic, without obvious abnormality, atraumatic  Eyes:    PERRL, conjunctiva/corneas clear, EOM's intact, fundi    benign, both eyes  Ears:    Normal TM's and external ear canals, both ears  Nose:   Nares normal, septum midline, mucosa normal, no drainage    or sinus tenderness  Throat:   Lips, mucosa, and tongue normal; teeth and gums normal  Neck:   Supple, symmetrical, trachea midline, no adenopathy;    thyroid:   no enlargement/tenderness/nodules; no carotid   bruit or JVD  Back:     Symmetric, no curvature, ROM normal, no CVA tenderness  Lungs:     Clear to auscultation bilaterally, respirations unlabored  Chest Wall:    No tenderness or deformity   Heart:    Regular rate and rhythm, S1 and S2 normal, no murmur, rub   or gallop  Breast Exam:    No tenderness, masses, or nipple abnormality  Abdomen:     Soft, non-tender, bowel sounds active all four quadrants,    no masses, no organomegaly  Genitalia:    Normal female without lesion, discharge or tenderness  Rectal:    Normal tone, normal prostate, no masses or tenderness;   guaiac negative stool  Extremities:   Extremities normal, atraumatic, no cyanosis or edema  Pulses:   2+ and symmetric all extremities  Skin:   Skin color, texture, turgor normal, no rashes  or lesions  Lymph nodes:   Cervical, supraclavicular, and axillary nodes normal  Neurologic:   CNII-XII intact, normal strength, sensation and reflexes    throughout    LABORATORY DATA:   CBC    Component Value Date/Time   WBC 10.9* 07/23/2015 1302   RBC 4.29 07/23/2015 1302   HGB 12.3 07/23/2015 1302   HCT 37.8 07/23/2015 1302   PLT 384 07/23/2015 1302   MCV 88.1 07/23/2015 1302   MCH 28.7 07/23/2015 1302   MCHC 32.5 07/23/2015 1302   RDW 13.1 07/23/2015 1302   LYMPHSABS 2.9 07/23/2015 1302   MONOABS 0.6 07/23/2015 1302   EOSABS 0.1 07/23/2015 1302   BASOSABS 0.0 07/23/2015 1302      Chemistry      Component Value Date/Time   NA 141 06/28/2015 1025   K 4.2 06/28/2015 1025   CL 104 06/28/2015 1025   CO2 30 06/28/2015 1025   BUN 11 06/28/2015 1025   CREATININE 0.68 06/28/2015 1025   CREATININE 0.80 06/07/2015 0634      Component Value Date/Time   CALCIUM 10.1 06/28/2015 1025   ALKPHOS 129 12/14/2014 0905   AST 13 12/14/2014 0905   ALT 10 12/14/2014 0905   BILITOT 0.5 12/14/2014 0905      Results for Brownley, Katilin B (MRN [id number]) as of 07/23/2015 08:06   Ref. Range 06/11/2008 22:33 09/08/2009 18:36 11/28/2010 15:04 11/29/2010 12:48 04/18/2011 08:30 04/18/2012 16:12 04/04/2013 09:12 05/19/2014 09:11 08/10/2014 16:29 08/13/2014 07:53 12/14/2014 09:05 06/07/2015 06:26  Platelets Latest Ref Range: 150-400 K/uL 401 (H) 390 438 (H) 451 (H) 208 431 (H) 420 (H) 434 (H) 420 (H) 457 (H) 402 (H) 454 (H)     RADIOGRAPHY: No results found.     PATHOLOGY:  N/A  ASSESSMENT/PLAN:   Thrombocytosis (HCC) Hepatic Steatosis  Minimal thrombocytosis dating back to at least January 2010 with intermittent upper limits of normal platelet counts. Preservation of WBC, RBC, hemoglobin and hematocrit is noted.  I discussed causes of thrombocytosis with the patient. The initial diagnostic question is whether thrombocytosis is a reactive phenomenon or a marker for the presence of a hematologic disorder Causes of reactive thrombocytosis include iron deficiency, B12 deficiency, malignancy, rheumatologic disorders, chronic infection, allergies, reaction to medications. Etc.  If a reactive process has been ruled out, the next step is to accurately classify the thrombocytosis as being due to one of the defined myeloproliferative or myelodysplastic disorders. In general, autonomous thrombocytosis (AT) is a reasonable diagnostic possibility in a patient with chronic thrombocytosis, normal iron stores, and an intact spleen.  There are no laboratory findings which are pathognomonic for ET. Thus, ET is the working diagnosis when autonomous or clonal thrombocytosis occurs in the absence of the diagnostic features noted above  Diagnostic criteria for ET proposed by the [facility]  requires a sustained platelet count of over 450,000/microL . The rate of cytogenetic abnormalities is less than 5 percent, although the JAK2 V617F mutation is seen in approximately 50 percent of patients with ET.  Will do genetic testing: JAK2, MPL, CALR Follow up to review blood work and genetic testing  Labs today: CBC  with differential, CRP, ESR, ferritin, pathologist smear review, JAK2 with reflex to exon 12/13 and CALR/E12/MPL.  She will return in 2-3 weeks for follow-up.  All questions were answered. The patient knows to call the clinic with any problems, questions or concerns. We can certainly see the patient much sooner if necessary.  This note is electronically signed [id number] Cyril Mourning, MD  07/23/2015 3:51 PM

## 2015-07-23 NOTE — Assessment & Plan Note (Addendum)
Minimal thrombocytosis dating back to at least January 2010 with intermittent upper limits of normal platelet counts. Preservation of WBC, RBC, hemoglobin and hematocrit is noted.  Labs today: CBC with differential, CRP, ESR, ferritin, pathologist smear review, JAK2 with reflex to exon 12/13 and CALR/E12/MPL.  She will return in 2-3 weeks for follow-up.

## 2015-07-23 NOTE — Patient Instructions (Signed)
..  [place] at [facility] Discharge Instructions  RECOMMENDATIONS MADE BY THE CONSULTANT AND ANY TEST RESULTS WILL BE SENT TO YOUR REFERRING PHYSICIAN.  Exam today per Dr. Whitney Muse and Caroleen Hamman PA-C Labs today Return in 2-3 weeks  Thank you for choosing [place] at [facility] to provide your oncology and hematology care.  To afford each patient quality time with our provider, please arrive at least 15 minutes before your scheduled appointment time.   Beginning January 23rd 2017 lab work for the [facility] will be done in the  Main lab at [facility] on 1st floor. If you have a lab appointment with the [facility] please come in thru the  Main Entrance and check in at the main information desk  You need to re-schedule your appointment should you arrive 10 or more minutes late.  We strive to give you quality time with our providers, and arriving late affects you and other patients whose appointments are after yours.  Also, if you no show three or more times for appointments you may be dismissed from the clinic at the providers discretion.     Again, thank you for choosing [facility].  Our hope is that these requests will decrease the amount of time that you wait before being seen by our physicians.       _____________________________________________________________  Should you have questions after your visit to [facility], please contact our office at [phone] between the hours of 8:30 a.m. and 4:30 p.m.  Voicemails left after 4:30 p.m. will not be returned until the following business day.  For prescription refill requests, have your pharmacy contact our office.         Resources For Cancer Patients and their Caregivers ? American Cancer Society: Can assist with transportation, wigs, general needs, runs Look Good Feel Better.        7168166527 ? Cancer Care: Provides financial assistance,  online support groups, medication/co-pay assistance.  1-800-813-HOPE 502-215-2740) ? [employer] [place] Assists [place] cancer patients and their families through emotional , educational and financial support.  2310593239 ? [place] DSS Where to apply for food stamps, Medicaid and utility assistance. 415-822-0223 ? [facility]: Transportation to medical appointments. 530 252 5941 ? Social Security Administration: May apply for disability if have a Stage IV cancer. 2168329319 239-589-6365 ? [employer], Disability and Transit Services: Assists with nutrition, care and transit needs. 5404758294

## 2015-07-24 LAB — FERRITIN: Ferritin: 61 ng/mL (ref 11–307)

## 2015-07-24 LAB — C-REACTIVE PROTEIN: CRP: 0.9 mg/dL (ref <1.0)

## 2015-07-26 LAB — PATHOLOGIST SMEAR REVIEW

## 2015-08-03 LAB — CALR + JAK2 E12-15 + MPL (REFLEXED)

## 2015-08-03 LAB — JAK2 V617F, W REFLEX TO CALR/E12/MPL

## 2015-08-09 ENCOUNTER — Encounter: Payer: Self-pay | Admitting: Family Medicine

## 2015-08-09 ENCOUNTER — Encounter (HOSPITAL_BASED_OUTPATIENT_CLINIC_OR_DEPARTMENT_OTHER): Payer: 59 | Admitting: Hematology & Oncology

## 2015-08-09 ENCOUNTER — Encounter (HOSPITAL_COMMUNITY): Payer: Self-pay | Admitting: Hematology & Oncology

## 2015-08-09 ENCOUNTER — Ambulatory Visit (INDEPENDENT_AMBULATORY_CARE_PROVIDER_SITE_OTHER): Payer: 59 | Admitting: Family Medicine

## 2015-08-09 VITALS — BP 138/88 | HR 91 | Resp 16 | Ht 63.0 in | Wt 177.0 lb

## 2015-08-09 VITALS — BP 135/56 | HR 85 | Temp 98.3°F | Resp 18 | Wt 176.9 lb

## 2015-08-09 DIAGNOSIS — D473 Essential (hemorrhagic) thrombocythemia: Secondary | ICD-10-CM

## 2015-08-09 DIAGNOSIS — D75839 Thrombocytosis, unspecified: Secondary | ICD-10-CM

## 2015-08-09 DIAGNOSIS — E785 Hyperlipidemia, unspecified: Secondary | ICD-10-CM

## 2015-08-09 DIAGNOSIS — E559 Vitamin D deficiency, unspecified: Secondary | ICD-10-CM | POA: Diagnosis not present

## 2015-08-09 DIAGNOSIS — I10 Essential (primary) hypertension: Secondary | ICD-10-CM

## 2015-08-09 DIAGNOSIS — K760 Fatty (change of) liver, not elsewhere classified: Secondary | ICD-10-CM

## 2015-08-09 DIAGNOSIS — E669 Obesity, unspecified: Secondary | ICD-10-CM

## 2015-08-09 DIAGNOSIS — E611 Iron deficiency: Secondary | ICD-10-CM | POA: Diagnosis not present

## 2015-08-09 DIAGNOSIS — K219 Gastro-esophageal reflux disease without esophagitis: Secondary | ICD-10-CM | POA: Diagnosis not present

## 2015-08-09 DIAGNOSIS — G4700 Insomnia, unspecified: Secondary | ICD-10-CM

## 2015-08-09 MED ORDER — POLYSACCHARIDE IRON COMPLEX 150 MG PO CAPS: 150.0000 mg | ORAL_CAPSULE | Freq: Every day | ORAL | Status: DC

## 2015-08-09 MED FILL — POLY-IRON 150 MG CAPSULE: 150 | 30 days supply | Qty: 30 | Fill #0

## 2015-08-09 NOTE — Progress Notes (Signed)
Subjective:    Patient ID: Kristin Hall, female    DOB: September 07, 1955, 60 y.o.   MRN: [id number]  HPI   Kristin Hall     MRN: [id number]      DOB: 1955-10-14   HPI [name] is here for follow up and re-evaluation of chronic medical conditions, medication management and review of any available recent lab and radiology data.  Preventive health is updated, specifically  Cancer screening and Immunization.   Questions or concerns regarding consultations or procedures which the PT has had in the interim are  Addressed.Very happy with cardiology workup, feels that low potassium was the cause of her chest pain, today concerned that blood pressure , though controlled , is "higher than she is accustomed to having it" The PT denies any adverse reactions to current medications since the last visit.  Has started hematology eval for mild thrombocytosis per cardiology referral and has upcoming appt    ROS Denies recent fever or chills. Denies sinus pressure, nasal congestion, ear pain or sore throat. Denies chest congestion, productive cough or wheezing. Denies chest pains, palpitations and leg swelling Denies abdominal pain, nausea, vomiting,diarrhea or constipation.   Denies dysuria, frequency, hesitancy or incontinence. Denies joint pain, swelling and limitation in mobility. Denies headaches, seizures, numbness, or tingling. Denies depression, anxiety or insomnia. Denies skin break down or rash.   PE  BP 138/88 mmHg  Pulse 91  Resp 16  Ht 5\' 3"  (1.6 m)  Wt 177 lb (80.287 kg)  BMI 31.36 kg/m2  SpO2 97%  Patient alert and oriented and in no cardiopulmonary distress.  HEENT: No facial asymmetry, EOMI,   oropharynx pink and moist.  Neck supple no JVD, no mass.  Chest: Clear to auscultation bilaterally.  CVS: S1, S2 no murmurs, no S3.Regular rate.  ABD: Soft non tender.   Ext: No edema  MS: Adequate ROM spine, shoulders, hips and knees.  Skin: Intact, no ulcerations or rash  noted.  Psych: Good eye contact, normal affect. Memory intact not anxious or depressed appearing.  CNS: CN 2-12 intact, power,  normal throughout.no focal deficits noted.   Assessment & Plan   Essential hypertension Controlled, no change in medication DASH diet and commitment to daily physical activity for a minimum of 30 minutes discussed and encouraged, as a part of hypertension management. The importance of attaining a healthy weight is also discussed.  BP/Weight 08/09/2015 08/09/2015 07/23/2015 06/24/2015 06/07/2015 06/03/2015 05/31/2015  Systolic BP 135 138 135 144 148 122 140  Diastolic BP 56 88 76 90 62 84 90  Wt. (Lbs) 176.9 177 175.3 173 175 176 174.8  BMI 31.34 31.36 31.06 30.65 30.02 31.18 30.97        GERD Controlled, no change in management, followed by GI   Hyperlipemia Hyperlipidemia:Low fat diet discussed and encouraged.   Lipid Panel  Lab Results  Component Value Date   CHOL 150 05/28/2015   HDL 41* 05/28/2015   LDLCALC 87 05/28/2015   TRIG 112 05/28/2015   CHOLHDL 3.7 05/28/2015   adequate control on current meds, needs to commit to daily exercise to improve HDL     Obesity Deteriorated. Patient re-educated about  the importance of commitment to a  minimum of 150 minutes of exercise per week.  The importance of healthy food choices with portion control discussed. Encouraged to start a food diary, count calories and to consider  joining a support group. Sample diet sheets offered. Goals set by the patient for the  next several months.   Weight /BMI 08/09/2015 08/09/2015 07/23/2015  WEIGHT 176 lb 14.4 oz 177 lb 175 lb 4.8 oz  HEIGHT - 5\' 3"  -  BMI 31.34 kg/m2 31.36 kg/m2 31.06 kg/m2    Current exercise per week 60 minutes.   Insomnia Sleep hygiene reviewed and written information offered also. Uses medication very infrequently, relying on behavior modification , which is good        Review of Systems     Objective:   Physical  Exam        Assessment & Plan:

## 2015-08-09 NOTE — Progress Notes (Signed)
[facility] Hematology/Oncology Consultation   Name: Kristin Hall      MRN: [id number]   Date: 08/09/2015 Time:1:53 PM   REFERRING PHYSICIAN:  Harriet Butte, MD (Cardiology)  REASON FOR CONSULT:  Thrombocytosis   DIAGNOSIS:  Minimal thrombocytosis dating back to at least 2010  HISTORY OF PRESENT ILLNESS:   Kristin Hall is a pleasant 59 year old black American with a past medical history significant for hyperlipidemia, hypertension, GERD, obesity, hypokinesia of the esophagus, who is referred to [place] for a mild thrombocytosis and on chart review it dates back to at least 2010.  I personally reviewed and went over laboratory results with the patient.  The results are noted within this dictation.  Thrombocytosis dates back to at least 06/11/2008 at which time platelet count was 401,000. Over the next 7 years, she's had instances of upper limits of normal count with consistent mild thrombocytosis as high as 457,000. Additionally, she has preservation of white blood cell counts, hemoglobin, RBC, and hematocrit.  Kristin Hall is here alone. I personally reviewed and went over laboratory studies with the patient at length, including WBC and iron levels.  Denies any abdominal pain or blood in stool. She is up to date on her screening mammograms.    PAST MEDICAL HISTORY:   Past Medical History  Diagnosis Date  . Hyperlipidemia   . Hypertension   . Elevated alkaline phosphatase level   . Thrombocytosis ([place]) 07/23/2015    ALLERGIES: Allergies  Allergen Reactions  . Amlodipine Other (See Comments)    Chest pain  . Aspirin Other (See Comments)    Generalized aches  . Metronidazole Hives and Itching  . Valsartan Cough      MEDICATIONS: I have reviewed the patient's current medications.    Current Outpatient Prescriptions on File Prior to Visit  Medication Sig Dispense Refill  . Multiple Vitamins-Minerals (ALIVE WOMENS 50+) TABS Take 1  tablet by mouth daily.    . pantoprazole (PROTONIX) 40 MG tablet Take 1 tablet (40 mg total) by mouth daily. 90 tablet 3  . potassium chloride SA (K-DUR,KLOR-CON) 20 MEQ tablet Take 2 tablets (40 mEq total) by mouth daily. 180 tablet 3  . TAZTIA XT 300 MG 24 hr capsule TAKE 1 CAPSULE BY MOUTH ONCE DAILY 90 capsule 3  . valsartan-hydrochlorothiazide (DIOVAN HCT) 320-25 MG tablet Take 1 tablet by mouth daily. 90 tablet 1  . VYTORIN 10-10 MG per tablet TAKE 1 TABLET BY MOUTH AT BEDTIME FOR CHOLESTEROL. 30 tablet 5  . [DISCONTINUED] amLODipine (NORVASC) 10 MG tablet Take 1 tablet (10 mg total) by mouth daily. 90 tablet 1   No current facility-administered medications on file prior to visit.     PAST SURGICAL HISTORY Past Surgical History  Procedure Laterality Date  . Abdominal hysterectomy      partial   . Colonoscopy  10/04/2006    SLF: Normal retroflexed view of the rectum/Normal colon without evidence of polyps, masses, inflammatory changes, diverticula or arteriovenous malformations    FAMILY HISTORY: Family History  Problem Relation Age of Onset  . Colon cancer Neg Hx   . Liver disease Neg Hx   . Cancer Mother     Breast  . Dementia Mother   . Cancer Father     Lung Cancer, deceased at 33    SOCIAL HISTORY:  reports that she has never smoked. She has never used smokeless tobacco. She reports that she does not drink alcohol  or use illicit drugs. 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  PERFORMANCE STATUS: The patient's performance status is 0 - Asymptomatic  PHYSICAL EXAM: Most Recent Vital Signs: There were no vitals taken for this visit. There were no vitals taken for this visit.  General Appearance:    Alert, cooperative, no distress, appears stated age, wears glasses.   Head:    Normocephalic, without obvious abnormality, atraumatic  Eyes:    PERRL, conjunctiva/corneas clear, EOM's intact, fundi    benign, both eyes  Ears:     Normal TM's and external ear canals, both ears  Nose:   Nares normal, septum midline, mucosa normal, no drainage    or sinus tenderness  Throat:   Lips, mucosa, and tongue normal; teeth and gums normal  Neck:   Supple, symmetrical, trachea midline, no adenopathy;    thyroid:  no enlargement/tenderness/nodules; no carotid   bruit or JVD  Back:     Symmetric, no curvature, ROM normal, no CVA tenderness  Lungs:     Clear to auscultation bilaterally, respirations unlabored  Chest Wall:    No tenderness or deformity   Heart:    Regular rate and rhythm, S1 and S2 normal, no murmur, rub   or gallop  Breast Exam:    No tenderness, masses, or nipple abnormality  Abdomen:     Soft, non-tender, bowel sounds active all four quadrants,    no masses, no organomegaly  Genitalia:    Normal female without lesion, discharge or tenderness  Rectal:    Normal tone, normal prostate, no masses or tenderness;   guaiac negative stool  Extremities:   Extremities normal, atraumatic, no cyanosis or edema  Pulses:   2+ and symmetric all extremities  Skin:   Skin color, texture, turgor normal, no rashes or lesions  Lymph nodes:   Cervical, supraclavicular, and axillary nodes normal  Neurologic:   CNII-XII intact, normal strength, sensation and reflexes    throughout    LABORATORY DATA:  I have reviewed the data as listed. CBC    Component Value Date/Time   WBC 10.9* 07/23/2015 1302   RBC 4.29 07/23/2015 1302   HGB 12.3 07/23/2015 1302   HCT 37.8 07/23/2015 1302   PLT 384 07/23/2015 1302   MCV 88.1 07/23/2015 1302   MCH 28.7 07/23/2015 1302   MCHC 32.5 07/23/2015 1302   RDW 13.1 07/23/2015 1302   LYMPHSABS 2.9 07/23/2015 1302   MONOABS 0.6 07/23/2015 1302   EOSABS 0.1 07/23/2015 1302   BASOSABS 0.0 07/23/2015 1302      Chemistry      Component Value Date/Time   NA 141 06/28/2015 1025   K 4.2 06/28/2015 1025   CL 104 06/28/2015 1025   CO2 30 06/28/2015 1025   BUN 11 06/28/2015 1025   CREATININE  0.68 06/28/2015 1025   CREATININE 0.80 06/07/2015 0634      Component Value Date/Time   CALCIUM 10.1 06/28/2015 1025   ALKPHOS 129 12/14/2014 0905   AST 13 12/14/2014 0905   ALT 10 12/14/2014 0905   BILITOT 0.5 12/14/2014 0905      Results for SABRIA, SEEKINGS B (MRN [id number]) as of 08/09/2015 13:52  Ref. Range 06/07/2015 09:41 06/07/2015 11:49 06/28/2015 10:25 07/23/2015 13:02 07/23/2015 13:03  Platelets Latest Ref Range: 150-400 K/uL    384    Results for SHAI, COMLY (MRN [id number]) as of 08/09/2015 13:52  Ref. Range 06/07/2015 09:41 06/07/2015 11:49  06/28/2015 10:25 07/23/2015 13:02 07/23/2015 13:03  Ferritin Latest Ref Range: 11-307 ng/mL     61    JAK 2 V617F mutation negative, CALR mutation negative, MPL mutation negative, JAK 2 exon 12 mutation negative   Results for SHAKIYAH, LEUER (MRN [id number]) as of 08/09/2015 20:11  Ref. Range 07/23/2015 13:03  Ferritin Latest Ref Range: 11-307 ng/mL 61  CRP Latest Ref Range: <1.0 mg/dL 0.9    ASSESSMENT/PLAN:  Thrombocytosis (HCC) Hepatic Steatosis  Minimal thrombocytosis dating back to at least January 2010 with intermittent upper limits of normal platelet counts. Preservation of WBC, RBC, hemoglobin and hematocrit is noted. Labs were unrevealing except for mild iron deficiency. Patient notes she is up to date with screening C scope. She follows with Dr. Oneida Alar.   I have written the patient a prescription for oral iron. I discussed with her that iron deficiency can cause thrombocytosis. We will repeat labs in 8 weeks and if improved she will complete 6 months of iron replacement with plans for discharge. If labs are no better she agrees to ongoing observation and return in 4 months.   All questions were answered. The patient knows to call the clinic with any problems, questions or concerns. We can certainly see the patient much sooner if necessary.  This document serves as a record of services personally performed by Ancil Linsey,  MD. It was created on her behalf by Arlyce Harman, a trained medical scribe. The creation of this record is based on the scribe's personal observations and the provider's statements to them. This document has been checked and approved by the attending provider.  I have reviewed the above documentation for accuracy and completeness, and I agree with the above.  This note is electronically signed [id number] Cyril Mourning, MD  08/09/2015 1:53 PM

## 2015-08-09 NOTE — Patient Instructions (Addendum)
F/u in May as before  Non fast cmp and EGFr, vit D for May visit  Please work on good  health habits so that your health will improve. 1. Commitment to daily physical activity for 30 to 60  minutes, if you are able to do this.  2. Commitment to wise food choices. Aim for half of your  food intake to be vegetable and fruit, one quarter starchy foods, and one quarter protein. Try to eat on a regular schedule  3 meals per day, snacking between meals should be limited to vegetables or fruits or small portions of nuts. 64 ounces of water per day is generally recommended, unless you have specific health conditions, like heart failure or kidney failure where you will need to limit fluid intake.  3. Commitment to sufficient and a  good quality of physical and mental rest daily, generally between 6 to 8 hours per day.  WITH PERSISTANCE AND PERSEVERANCE, THE IMPOSSIBLE , BECOMES THE NORM!   Thanks for choosing [facility], we consider it a privelige to serve you.

## 2015-08-09 NOTE — Patient Instructions (Addendum)
[place] at [facility] Discharge Instructions  RECOMMENDATIONS MADE BY THE CONSULTANT AND ANY TEST RESULTS WILL BE SENT TO YOUR REFERRING PHYSICIAN.      Coupons for niferex Forte  Let us know if you have any problems on the medication   Exam and discussion by Dr Whitney Muse today Discuss lab work from previous visit  Iron is moderately low, white blood counts are little high Other lab work looks good  Niferex sent to your pharmacy   Lab work in 2 months  Return to see the doctor in 4 months Please call the clinic if you have any questions or concerns        Thank you for choosing [place] at [facility] to provide your oncology and hematology care.  To afford each patient quality time with our provider, please arrive at least 15 minutes before your scheduled appointment time.   Beginning January 23rd 2017 lab work for the [facility] will be done in the  Main lab at [facility] on 1st floor. If you have a lab appointment with the [facility] please come in thru the  Main Entrance and check in at the main information desk  You need to re-schedule your appointment should you arrive 10 or more minutes late.  We strive to give you quality time with our providers, and arriving late affects you and other patients whose appointments are after yours.  Also, if you no show three or more times for appointments you may be dismissed from the clinic at the providers discretion.     Again, thank you for choosing [facility].  Our hope is that these requests will decrease the amount of time that you wait before being seen by our physicians.       _____________________________________________________________  Should you have questions after your visit to [facility], please contact our office at [phone] between the hours of 8:30 a.m. and 4:30 p.m.  Voicemails left after 4:30 p.m. will not be returned until the  following business day.  For prescription refill requests, have your pharmacy contact our office.         Resources For Cancer Patients and their Caregivers ? American Cancer Society: Can assist with transportation, wigs, general needs, runs Look Good Feel Better.        (938)092-3430 ? Cancer Care: Provides financial assistance, online support groups, medication/co-pay assistance.  1-800-813-HOPE 985-143-6487) ? [place] Assists [place] cancer patients and their families through emotional , educational and financial support.  615-107-4666 ? [place] DSS Where to apply for food stamps, Medicaid and utility assistance. 548-862-5545 ? [facility]: Transportation to medical appointments. 346-420-2442 ? Social Security Administration: May apply for disability if have a Stage IV cancer. 610-367-9528 615-756-6011 ? [employer], Disability and Transit Services: Assists with nutrition, care and transit needs. 715-513-7689

## 2015-08-15 ENCOUNTER — Encounter: Payer: Self-pay | Admitting: Family Medicine

## 2015-08-15 NOTE — Assessment & Plan Note (Signed)
Sleep hygiene reviewed and written information offered also. Uses medication very infrequently, relying on behavior modification , which is good

## 2015-08-15 NOTE — Assessment & Plan Note (Signed)
Controlled, no change in management, followed by GI

## 2015-08-15 NOTE — Assessment & Plan Note (Signed)
Hyperlipidemia:Low fat diet discussed and encouraged.   Lipid Panel  Lab Results  Component Value Date   CHOL 150 05/28/2015   HDL 41* 05/28/2015   LDLCALC 87 05/28/2015   TRIG 112 05/28/2015   CHOLHDL 3.7 05/28/2015   adequate control on current meds, needs to commit to daily exercise to improve HDL

## 2015-08-15 NOTE — Assessment & Plan Note (Signed)
Controlled, no change in medication DASH diet and commitment to daily physical activity for a minimum of 30 minutes discussed and encouraged, as a part of hypertension management. The importance of attaining a healthy weight is also discussed.  BP/Weight 08/09/2015 08/09/2015 07/23/2015 06/24/2015 06/07/2015 06/03/2015 [id number]  Systolic BP A999333 0000000 A999333 [id number]  Diastolic BP 56 88 76 90 62 84 90  Wt. (Lbs) 176.9 177 175.3 173 175 176 174.8  BMI 31.34 31.36 31.06 30.65 30.02 31.18 30.97

## 2015-08-15 NOTE — Assessment & Plan Note (Signed)
Deteriorated. Patient re-educated about  the importance of commitment to a  minimum of 150 minutes of exercise per week.  The importance of healthy food choices with portion control discussed. Encouraged to start a food diary, count calories and to consider  joining a support group. Sample diet sheets offered. Goals set by the patient for the next several months.   Weight /BMI 08/09/2015 08/09/2015 07/23/2015  WEIGHT 176 lb 14.4 oz 177 lb 175 lb 4.8 oz  HEIGHT - 5\' 3"  -  BMI 31.34 kg/m2 31.36 kg/m2 31.06 kg/m2    Current exercise per week 60 minutes.

## 2015-09-13 DIAGNOSIS — I10 Essential (primary) hypertension: Secondary | ICD-10-CM | POA: Diagnosis not present

## 2015-09-13 DIAGNOSIS — E559 Vitamin D deficiency, unspecified: Secondary | ICD-10-CM | POA: Diagnosis not present

## 2015-09-13 MED FILL — POLY-IRON 150 MG CAPSULE: 150 | 30 days supply | Qty: 30 | Fill #1

## 2015-09-14 LAB — COMPLETE METABOLIC PANEL WITH GFR
ALBUMIN: 4.3 g/dL (ref 3.6–5.1)
ALT: 14 U/L (ref 6–29)
AST: 20 U/L (ref 10–35)
Alkaline Phosphatase: 130 U/L (ref 33–130)
BUN: 10 mg/dL (ref 7–25)
CHLORIDE: 100 mmol/L (ref 98–110)
CO2: 27 mmol/L (ref 20–31)
CREATININE: 0.86 mg/dL (ref 0.50–1.05)
Calcium: 9.6 mg/dL (ref 8.6–10.4)
GFR, Est African American: 86 mL/min (ref >=60)
GFR, Est Non African American: 75 mL/min (ref >=60)
GLUCOSE: 79 mg/dL (ref 65–99)
POTASSIUM: 3.7 mmol/L (ref 3.5–5.3)
SODIUM: 140 mmol/L (ref 135–146)
Total Bilirubin: 0.5 mg/dL (ref 0.2–1.2)
Total Protein: 7.1 g/dL (ref 6.1–8.1)

## 2015-09-14 LAB — VITAMIN D 25 HYDROXY (VIT D DEFICIENCY, FRACTURES): Vit D, 25-Hydroxy: 38 ng/mL (ref 30–100)

## 2015-09-15 ENCOUNTER — Encounter: Payer: 59 | Admitting: Family Medicine

## 2015-09-16 ENCOUNTER — Other Ambulatory Visit (HOSPITAL_COMMUNITY)
Admission: RE | Admit: 2015-09-16 | Discharge: 2015-09-16 | Disposition: A | Discharge status: Home / Self Care | Payer: 59 | Source: Ambulatory Visit | Attending: Family Medicine | Admitting: Family Medicine

## 2015-09-16 ENCOUNTER — Ambulatory Visit (INDEPENDENT_AMBULATORY_CARE_PROVIDER_SITE_OTHER): Payer: 59 | Admitting: Family Medicine

## 2015-09-16 ENCOUNTER — Encounter: Payer: Self-pay | Admitting: Family Medicine

## 2015-09-16 VITALS — BP 128/84 | HR 86 | Resp 18 | Ht 63.0 in | Wt 176.1 lb

## 2015-09-16 DIAGNOSIS — Z0000 Encounter for general adult medical examination without abnormal findings: Secondary | ICD-10-CM | POA: Diagnosis not present

## 2015-09-16 DIAGNOSIS — I10 Essential (primary) hypertension: Secondary | ICD-10-CM

## 2015-09-16 DIAGNOSIS — Z1211 Encounter for screening for malignant neoplasm of colon: Secondary | ICD-10-CM | POA: Diagnosis not present

## 2015-09-16 DIAGNOSIS — Z01419 Encounter for gynecological examination (general) (routine) without abnormal findings: Secondary | ICD-10-CM | POA: Diagnosis not present

## 2015-09-16 DIAGNOSIS — E785 Hyperlipidemia, unspecified: Secondary | ICD-10-CM

## 2015-09-16 DIAGNOSIS — Z1151 Encounter for screening for human papillomavirus (HPV): Secondary | ICD-10-CM | POA: Diagnosis not present

## 2015-09-16 DIAGNOSIS — Z124 Encounter for screening for malignant neoplasm of cervix: Secondary | ICD-10-CM

## 2015-09-16 LAB — POC HEMOCCULT BLD/STL (OFFICE/1-CARD/DIAGNOSTIC): FECAL OCCULT BLD: NEGATIVE

## 2015-09-16 NOTE — Patient Instructions (Signed)
F/u in 5 month, call if you need me sooner  No changes in medication  Liver, kidney and vit D levels are all good, also Blood pressure   HAPPY BIRTHDAY MAY 7!   Fasting lipid, cmp and eGFr in 5 month  Please work on good  health habits so that your health will improve. 1. Commitment to daily physical activity for 30 to 60  minutes, if you are able to do this.  2. Commitment to wise food choices. Aim for half of your  food intake to be vegetable and fruit, one quarter starchy foods, and one quarter protein. Try to eat on a regular schedule  3 meals per day, snacking between meals should be limited to vegetables or fruits or small portions of nuts. 64 ounces of water per day is generally recommended, unless you have specific health conditions, like heart failure or kidney failure where you will need to limit fluid intake.  3. Commitment to sufficient and a  good quality of physical and mental rest daily, generally between 6 to 8 hours per day.  WITH PERSISTANCE AND PERSEVERANCE, THE IMPOSSIBLE , BECOMES THE NORM!   Thank you  for choosing [facility]. We consider it a privelige to serve you.  Delivering excellent health care in a caring and  compassionate way is our goal.  Partnering with you,  so that together we can achieve this goal is our strategy.

## 2015-09-16 NOTE — Progress Notes (Signed)
Subjective:    Patient ID: Kristin Hall, female    DOB: May 06, 1956, 60 y.o.   MRN: [id number]  HPI Patient is in for annual physical exam. No other health concerns are expressed or addressed at the visit. Recent labs, if available are reviewed. Immunization is reviewed , and  updated if needed.    Review of Systems See HPI     Objective:   Physical Exam BP 128/84 mmHg  Pulse 86  Resp 18  Ht 5\' 3"  (1.6 m)  Wt 176 lb 1.9 oz (79.888 kg)  BMI 31.21 kg/m2  SpO2 95%  Pleasant well nourished female, alert and oriented x 3, in no cardio-pulmonary distress. Afebrile. HEENT No facial trauma or asymetry. Sinuses non tender.  Extra occullar muscles intact, pupils equally reactive to light. External ears normal, tympanic membranes clear. Oropharynx moist, no exudate, good dentition. Neck: supple, no adenopathy,JVD or thyromegaly.No bruits.  Chest: Clear to ascultation bilaterally.No crackles or wheezes. Non tender to palpation  Breast: No asymetry,no masses or lumps. No tenderness. No nipple discharge or inversion. No axillary or supraclavicular adenopathy  Cardiovascular system; Heart sounds normal,  S1 and  S2 ,no S3.  No murmur, or thrill. Apical beat not displaced Peripheral pulses normal.  Abdomen: Soft, non tender, no organomegaly or masses. No bruits. Bowel sounds normal. No guarding, tenderness or rebound.  Rectal:  Normal sphincter tone. No mass.No rectal masses.  Guaiac negative stool.  GU: External genitalia normal female genitalia , female distribution of hair. No lesions. Urethral meatus normal in size, no  Prolapse, no lesions visibly  Present. Bladder non tender. Vagina pink and moist , with no visible lesions , discharge present . Adequate pelvic support no  cystocele or rectocele noted Cervix  aabsent  Uterus absent, no adnexal masses, no adnexal tenderness.   Musculoskeletal exam: Full ROM of spine, hips , shoulders and knees. No  deformity ,swelling or crepitus noted. No muscle wasting or atrophy.   Neurologic: Cranial nerves 2 to 12 intact. Power, tone ,sensation and reflexes normal throughout. No disturbance in gait. No tremor.  Skin: Intact, no ulceration, erythema , scaling or rash noted. Pigmentation normal throughout  Psych; Normal mood and affect. Judgement and concentration normal        Assessment & Plan:  Annual physical exam Annual exam as documented. Counseling done  re healthy lifestyle involving commitment to 150 minutes exercise per week, heart healthy diet, and attaining healthy weight.The importance of adequate sleep also discussed. Regular seat belt use and home safety, is also discussed. Changes in health habits are decided on by the patient with goals and time frames  set for achieving them. Immunization and cancer screening needs are specifically addressed at this visit.

## 2015-09-19 DIAGNOSIS — Z0000 Encounter for general adult medical examination without abnormal findings: Secondary | ICD-10-CM | POA: Insufficient documentation

## 2015-09-19 NOTE — Assessment & Plan Note (Signed)

## 2015-09-20 LAB — CYTOLOGY - PAP

## 2015-10-04 ENCOUNTER — Other Ambulatory Visit: Payer: Self-pay | Admitting: Family Medicine

## 2015-10-04 MED FILL — DILTIAZEM HCL ER 300 MG CAP: 300 | 90 days supply | Qty: 90 | Fill #0

## 2015-10-04 MED FILL — VALSARTAN-HCTZ 320-25 MG TA: 320-25 | 90 days supply | Qty: 90 | Fill #3

## 2015-10-04 MED FILL — PANTOPRAZOLE SOD DR 40 MG T: 40 | 90 days supply | Qty: 90 | Fill #3

## 2015-10-04 MED FILL — KLOR-CON M20 TABLET: 20 | 90 days supply | Qty: 180 | Fill #1

## 2015-10-12 ENCOUNTER — Encounter (HOSPITAL_COMMUNITY): Payer: 59 | Attending: Oncology

## 2015-10-12 ENCOUNTER — Other Ambulatory Visit (HOSPITAL_COMMUNITY): Payer: 59

## 2015-10-12 DIAGNOSIS — I10 Essential (primary) hypertension: Secondary | ICD-10-CM | POA: Insufficient documentation

## 2015-10-12 DIAGNOSIS — K219 Gastro-esophageal reflux disease without esophagitis: Secondary | ICD-10-CM | POA: Insufficient documentation

## 2015-10-12 DIAGNOSIS — E669 Obesity, unspecified: Secondary | ICD-10-CM | POA: Insufficient documentation

## 2015-10-12 DIAGNOSIS — E785 Hyperlipidemia, unspecified: Secondary | ICD-10-CM | POA: Insufficient documentation

## 2015-10-12 DIAGNOSIS — D473 Essential (hemorrhagic) thrombocythemia: Secondary | ICD-10-CM | POA: Insufficient documentation

## 2015-10-12 DIAGNOSIS — Z79899 Other long term (current) drug therapy: Secondary | ICD-10-CM | POA: Insufficient documentation

## 2015-10-12 DIAGNOSIS — Z90710 Acquired absence of both cervix and uterus: Secondary | ICD-10-CM | POA: Diagnosis not present

## 2015-10-12 DIAGNOSIS — K760 Fatty (change of) liver, not elsewhere classified: Secondary | ICD-10-CM | POA: Diagnosis not present

## 2015-10-12 DIAGNOSIS — D75839 Thrombocytosis, unspecified: Secondary | ICD-10-CM

## 2015-10-12 DIAGNOSIS — Z888 Allergy status to other drugs, medicaments and biological substances status: Secondary | ICD-10-CM | POA: Diagnosis not present

## 2015-10-12 DIAGNOSIS — Z809 Family history of malignant neoplasm, unspecified: Secondary | ICD-10-CM | POA: Diagnosis not present

## 2015-10-12 LAB — CBC WITH DIFFERENTIAL/PLATELET
BASOS PCT: 0 %
Basophils Absolute: 0 10*3/uL (ref 0.0–0.1)
Eosinophils Absolute: 0.1 10*3/uL (ref 0.0–0.7)
Eosinophils Relative: 1 %
HEMATOCRIT: 36.4 % (ref 36.0–46.0)
HEMOGLOBIN: 11.7 g/dL — AB (ref 12.0–15.0)
LYMPHS ABS: 3.5 10*3/uL (ref 0.7–4.0)
Lymphocytes Relative: 35 %
MCH: 28.8 pg (ref 26.0–34.0)
MCHC: 32.1 g/dL (ref 30.0–36.0)
MCV: 89.7 fL (ref 78.0–100.0)
MONOS PCT: 6 %
Monocytes Absolute: 0.6 10*3/uL (ref 0.1–1.0)
NEUTROS ABS: 5.7 10*3/uL (ref 1.7–7.7)
NEUTROS PCT: 58 %
Platelets: 413 10*3/uL — ABNORMAL HIGH (ref 150–400)
RBC: 4.06 MIL/uL (ref 3.87–5.11)
RDW: 13.3 % (ref 11.5–15.5)
WBC: 9.9 10*3/uL (ref 4.0–10.5)

## 2015-10-12 LAB — FERRITIN: Ferritin: 51 ng/mL (ref 11–307)

## 2015-10-25 MED FILL — POLY-IRON 150 MG CAPSULE: 150 | 30 days supply | Qty: 30 | Fill #2

## 2015-11-26 MED FILL — POLY-IRON 150 MG CAPSULE: 150 | 30 days supply | Qty: 30 | Fill #3

## 2015-12-09 ENCOUNTER — Ambulatory Visit (HOSPITAL_COMMUNITY): Payer: 59 | Admitting: Oncology

## 2015-12-09 ENCOUNTER — Encounter (HOSPITAL_COMMUNITY): Payer: 59 | Attending: Oncology | Admitting: Oncology

## 2015-12-09 ENCOUNTER — Other Ambulatory Visit (HOSPITAL_COMMUNITY): Payer: 59

## 2015-12-09 ENCOUNTER — Encounter (HOSPITAL_COMMUNITY): Payer: Self-pay | Admitting: Oncology

## 2015-12-09 ENCOUNTER — Encounter (HOSPITAL_COMMUNITY): Payer: 59

## 2015-12-09 VITALS — BP 149/62 | HR 82 | Temp 98.3°F | Resp 18 | Wt 179.3 lb

## 2015-12-09 DIAGNOSIS — D473 Essential (hemorrhagic) thrombocythemia: Secondary | ICD-10-CM | POA: Diagnosis not present

## 2015-12-09 DIAGNOSIS — K219 Gastro-esophageal reflux disease without esophagitis: Secondary | ICD-10-CM | POA: Diagnosis not present

## 2015-12-09 DIAGNOSIS — E785 Hyperlipidemia, unspecified: Secondary | ICD-10-CM | POA: Diagnosis not present

## 2015-12-09 DIAGNOSIS — Z888 Allergy status to other drugs, medicaments and biological substances status: Secondary | ICD-10-CM | POA: Diagnosis not present

## 2015-12-09 DIAGNOSIS — E611 Iron deficiency: Secondary | ICD-10-CM

## 2015-12-09 DIAGNOSIS — Z90710 Acquired absence of both cervix and uterus: Secondary | ICD-10-CM | POA: Insufficient documentation

## 2015-12-09 DIAGNOSIS — I10 Essential (primary) hypertension: Secondary | ICD-10-CM | POA: Diagnosis not present

## 2015-12-09 DIAGNOSIS — Z79899 Other long term (current) drug therapy: Secondary | ICD-10-CM | POA: Diagnosis not present

## 2015-12-09 DIAGNOSIS — Z809 Family history of malignant neoplasm, unspecified: Secondary | ICD-10-CM | POA: Insufficient documentation

## 2015-12-09 DIAGNOSIS — D75839 Thrombocytosis, unspecified: Secondary | ICD-10-CM

## 2015-12-09 DIAGNOSIS — E669 Obesity, unspecified: Secondary | ICD-10-CM | POA: Diagnosis not present

## 2015-12-09 DIAGNOSIS — K760 Fatty (change of) liver, not elsewhere classified: Secondary | ICD-10-CM | POA: Insufficient documentation

## 2015-12-09 LAB — CBC WITH DIFFERENTIAL/PLATELET
Basophils Absolute: 0 10*3/uL (ref 0.0–0.1)
Basophils Relative: 0 %
EOS PCT: 1 %
Eosinophils Absolute: 0.1 10*3/uL (ref 0.0–0.7)
HCT: 36.8 % (ref 36.0–46.0)
Hemoglobin: 12.1 g/dL (ref 12.0–15.0)
LYMPHS ABS: 2.7 10*3/uL (ref 0.7–4.0)
LYMPHS PCT: 33 %
MCH: 29 pg (ref 26.0–34.0)
MCHC: 32.9 g/dL (ref 30.0–36.0)
MCV: 88.2 fL (ref 78.0–100.0)
MONO ABS: 0.5 10*3/uL (ref 0.1–1.0)
MONOS PCT: 6 %
Neutro Abs: 4.8 10*3/uL (ref 1.7–7.7)
Neutrophils Relative %: 60 %
PLATELETS: 378 10*3/uL (ref 150–400)
RBC: 4.17 MIL/uL (ref 3.87–5.11)
RDW: 13 % (ref 11.5–15.5)
WBC: 8.2 10*3/uL (ref 4.0–10.5)

## 2015-12-09 LAB — FERRITIN: Ferritin: 56 ng/mL (ref 11–307)

## 2015-12-09 NOTE — Progress Notes (Signed)
Tula Nakayama, MD [address] [place] [address]  Iron deficiency - Plan: CBC, Ferritin, Iron and TIBC  Thrombocytosis (HCC) - Plan: CBC, Ferritin  CURRENT THERAPY: Niferex 150 mg PO daily beginning on 08/09/2015.  INTERVAL HISTORY: Kristin Hall 60 y.o. female returns for followup of minimal thrombocytosis dating back to at least 2010.  Work-up was unrevealing except for a mild iron deficiency which can cause a mild thrombocytosis.  As a result, she was started on PO iron replacement in March 2017.  She is tolerating oral iron well without any complaints.  She denies any complaints today.  She is provided education regarding iron and its connection to RBC/HGB.  Review of Systems  Constitutional: Negative.  Negative for chills, fever and weight loss.  HENT: Negative.   Eyes: Negative.   Respiratory: Negative.   Cardiovascular: Negative.  Negative for leg swelling.  Gastrointestinal: Negative.  Negative for abdominal pain, constipation, diarrhea, nausea and vomiting.  Genitourinary: Negative.   Musculoskeletal: Negative.   Skin: Negative.   Neurological: Negative.  Negative for weakness.  Endo/Heme/Allergies: Negative.   Psychiatric/Behavioral: Negative.     Past Medical History:  Diagnosis Date  . Elevated alkaline phosphatase level   . Hyperlipidemia   . Hypertension   . Thrombocytosis ([place]) 07/23/2015    Past Surgical History:  Procedure Laterality Date  . ABDOMINAL HYSTERECTOMY     partial   . COLONOSCOPY  10/04/2006   SLF: Normal retroflexed view of the rectum/Normal colon without evidence of polyps, masses, inflammatory changes, diverticula or arteriovenous malformations    Family History  Problem Relation Age of Onset  . Cancer Mother     Breast  . Dementia Mother   . Cancer Father     Lung Cancer, deceased at 32  . Colon cancer Neg Hx   . Liver disease Neg Hx     Social History   Social History  . Marital status:  Widowed    Spouse name: N/A  . Number of children: N/A  . Years of education: N/A   Social History Main Topics  . Smoking status: Never Smoker  . Smokeless tobacco: Never Used  . Alcohol use No  . Drug use: No  . Sexual activity: Yes   Other Topics Concern  . None   Social History Narrative  . None     PHYSICAL EXAMINATION  ECOG PERFORMANCE STATUS: 0 - Asymptomatic  Vitals:   12/09/15 0900  BP: (!) 149/62  Pulse: 82  Resp: 18  Temp: 98.3 F (36.8 C)    GENERAL:alert, no distress, well nourished, well developed, comfortable, cooperative, obese, smiling and unaccompanied SKIN: skin color, texture, turgor are normal, no rashes or significant lesions HEAD: Normocephalic, No masses, lesions, tenderness or abnormalities EYES: normal, EOMI, Conjunctiva are pink and non-injected EARS: External ears normal OROPHARYNX:lips, buccal mucosa, and tongue normal and mucous membranes are moist  NECK: supple, trachea midline LYMPH:  no palpable lymphadenopathy BREAST:not examined LUNGS: clear to auscultation  HEART: regular rate & rhythm, no murmurs, no gallops, S1 normal and S2 normal ABDOMEN:abdomen soft, non-tender and normal bowel sounds BACK: Back symmetric, no curvature. EXTREMITIES:less then 2 second capillary refill, no joint deformities, effusion, or inflammation, no skin discoloration, no cyanosis  NEURO: alert & oriented x 3 with fluent speech, no focal motor/sensory deficits, gait normal   LABORATORY DATA: CBC    Component Value Date/Time   WBC 8.2 12/09/2015 0836   RBC 4.17  12/09/2015 0836   HGB 12.1 12/09/2015 0836   HCT 36.8 12/09/2015 0836   PLT 378 12/09/2015 0836   MCV 88.2 12/09/2015 0836   MCH 29.0 12/09/2015 0836   MCHC 32.9 12/09/2015 0836   RDW 13.0 12/09/2015 0836   LYMPHSABS 2.7 12/09/2015 0836   MONOABS 0.5 12/09/2015 0836   EOSABS 0.1 12/09/2015 0836   BASOSABS 0.0 12/09/2015 0836      Chemistry      Component Value Date/Time   NA 140  09/13/2015 1546   K 3.7 09/13/2015 1546   CL 100 09/13/2015 1546   CO2 27 09/13/2015 1546   BUN 10 09/13/2015 1546   CREATININE 0.86 09/13/2015 1546      Component Value Date/Time   CALCIUM 9.6 09/13/2015 1546   ALKPHOS 130 09/13/2015 1546   AST 20 09/13/2015 1546   ALT 14 09/13/2015 1546   BILITOT 0.5 09/13/2015 1546     Lab Results  Component Value Date   IRON 53 04/04/2013   FERRITIN 56 12/09/2015     PENDING LABS:   RADIOGRAPHIC STUDIES:  No results found.   PATHOLOGY:    ASSESSMENT AND PLAN:  Thrombocytosis ([place]) Minimal thrombocytosis dating back to at least January 2010 with intermittent upper limits of normal platelet counts. Preservation of WBC, RBC, hemoglobin and hematocrit is noted. Work-up was unrevealing except for a mild iron deficiency which can cause a mild thrombocytosis.  As a result, she was started on PO iron replacement in March 2017.  Labs today: CBC diff, ferritin.  I personally reviewed and went over laboratory results with the patient.  The results are noted within this dictation. Platelet count is normal today.  Labs in 4 months: CBC diff, ferritin.  If thrombocytosis resolves with PO iron replacement, we will discuss releasing the patient from the clinic.  She will continue with PO iron as directed.  She is tolerating this very well.   Return in 4 months for follow-up.   ORDERS PLACED FOR THIS ENCOUNTER: Orders Placed This Encounter  Procedures  . CBC  . Ferritin  . Iron and TIBC    MEDICATIONS PRESCRIBED THIS ENCOUNTER: No orders of the defined types were placed in this encounter.   THERAPY PLAN:  Continue PO iron as directed.  All questions were answered. The patient knows to call the clinic with any problems, questions or concerns. We can certainly see the patient much sooner if necessary.  Patient and plan discussed with Dr. Ancil Linsey and she is in agreement with the aforementioned.   This note is electronically  signed by: Doy Mince 12/09/2015 9:38 PM

## 2015-12-09 NOTE — Assessment & Plan Note (Addendum)
Minimal thrombocytosis dating back to at least January 2010 with intermittent upper limits of normal platelet counts. Preservation of WBC, RBC, hemoglobin and hematocrit is noted. Work-up was unrevealing except for a mild iron deficiency which can cause a mild thrombocytosis.  As a result, she was started on PO iron replacement in March 2017.  Labs today: CBC diff, ferritin.  I personally reviewed and went over laboratory results with the patient.  The results are noted within this dictation. Platelet count is normal today.  Labs in 4 months: CBC diff, ferritin.  If thrombocytosis resolves with PO iron replacement, we will discuss releasing the patient from the clinic.  She will continue with PO iron as directed.  She is tolerating this very well.   Return in 4 months for follow-up.

## 2015-12-09 NOTE — Patient Instructions (Signed)
[place] at [facility] Discharge Instructions  RECOMMENDATIONS MADE BY THE CONSULTANT AND ANY TEST RESULTS WILL BE SENT TO YOUR REFERRING PHYSICIAN.  Labs today are excellent.  Platelet count and hemoglobin are normal today. We will repeat labs in 4 months.  If all is well, we will consider release you from the clinic at that time (knowing, that we would be glad to see you back if needed). Return in 4 months for follow-up.  Thank you for choosing [place] at [facility] to provide your oncology and hematology care.  To afford each patient quality time with our provider, please arrive at least 15 minutes before your scheduled appointment time.   Beginning January 23rd 2017 lab work for the [facility] will be done in the  Main lab at [facility] on 1st floor. If you have a lab appointment with the [facility] please come in thru the  Main Entrance and check in at the main information desk  You need to re-schedule your appointment should you arrive 10 or more minutes late.  We strive to give you quality time with our providers, and arriving late affects you and other patients whose appointments are after yours.  Also, if you no show three or more times for appointments you may be dismissed from the clinic at the providers discretion.     Again, thank you for choosing [facility].  Our hope is that these requests will decrease the amount of time that you wait before being seen by our physicians.       _____________________________________________________________  Should you have questions after your visit to [facility], please contact our office at [phone] between the hours of 8:30 a.m. and 4:30 p.m.  Voicemails left after 4:30 p.m. will not be returned until the following business day.  For prescription refill requests, have your pharmacy contact our office.         Resources For Cancer Patients and their  Caregivers ? American Cancer Society: Can assist with transportation, wigs, general needs, runs Look Good Feel Better.        7471847823 ? Cancer Care: Provides financial assistance, online support groups, medication/co-pay assistance.  1-800-813-HOPE (320)788-0788) ? [place] Assists [place] cancer patients and their families through emotional , educational and financial support.  430-273-8632 ? [place] DSS Where to apply for food stamps, Medicaid and utility assistance. (804) 216-6493 ? RCATS: Transportation to medical appointments. 2812505044 ? Social Security Administration: May apply for disability if have a Stage IV cancer. [phone] ? [employer], Disability and Transit Services: Assists with nutrition, care and transit needs. [place] Support Programs: @10RELATIVEDAYS @ > Cancer Support Group  2nd Tuesday of the month 1pm-2pm, Journey Room  > Creative Journey  3rd Tuesday of the month 1130am-1pm, Journey Room  > Look Good Feel Better  1st Wednesday of the month 10am-12 noon, Journey Room (Call [place] to register [phone])

## 2015-12-28 ENCOUNTER — Other Ambulatory Visit: Payer: Self-pay | Admitting: Family Medicine

## 2015-12-28 DIAGNOSIS — K219 Gastro-esophageal reflux disease without esophagitis: Secondary | ICD-10-CM

## 2015-12-28 MED FILL — PANTOPRAZOLE SOD DR 40 MG T: 40 | 90 days supply | Qty: 90 | Fill #0

## 2015-12-28 MED FILL — POLY-IRON 150 MG CAPSULE: 150 | 30 days supply | Qty: 30 | Fill #4

## 2016-01-12 ENCOUNTER — Other Ambulatory Visit: Payer: Self-pay | Admitting: Family Medicine

## 2016-01-12 MED FILL — KLOR-CON M20 TABLET: 20 | 90 days supply | Qty: 180 | Fill #2

## 2016-01-12 MED FILL — DILTIAZEM HCL ER 300 MG CAP: 300 | 90 days supply | Qty: 90 | Fill #1

## 2016-01-14 MED FILL — VALSARTAN-HCTZ 320-25 MG TA: 320-25 | 90 days supply | Qty: 90 | Fill #0

## 2016-02-04 DIAGNOSIS — H179 Unspecified corneal scar and opacity: Secondary | ICD-10-CM | POA: Diagnosis not present

## 2016-02-04 DIAGNOSIS — I10 Essential (primary) hypertension: Secondary | ICD-10-CM | POA: Diagnosis not present

## 2016-02-04 DIAGNOSIS — H524 Presbyopia: Secondary | ICD-10-CM | POA: Diagnosis not present

## 2016-02-04 DIAGNOSIS — E785 Hyperlipidemia, unspecified: Secondary | ICD-10-CM | POA: Diagnosis not present

## 2016-02-04 DIAGNOSIS — H5203 Hypermetropia, bilateral: Secondary | ICD-10-CM | POA: Diagnosis not present

## 2016-02-04 DIAGNOSIS — H52222 Regular astigmatism, left eye: Secondary | ICD-10-CM | POA: Diagnosis not present

## 2016-02-04 LAB — COMPLETE METABOLIC PANEL WITH GFR
ALT: 16 U/L (ref 6–29)
AST: 20 U/L (ref 10–35)
Albumin: 4 g/dL (ref 3.6–5.1)
Alkaline Phosphatase: 124 U/L (ref 33–130)
BILIRUBIN TOTAL: 0.5 mg/dL (ref 0.2–1.2)
BUN: 9 mg/dL (ref 7–25)
CHLORIDE: 103 mmol/L (ref 98–110)
CO2: 29 mmol/L (ref 20–31)
Calcium: 9.5 mg/dL (ref 8.6–10.4)
Creat: 0.88 mg/dL (ref 0.50–1.05)
GFR, EST AFRICAN AMERICAN: 83 mL/min (ref >=60)
GFR, EST NON AFRICAN AMERICAN: 72 mL/min (ref >=60)
GLUCOSE: 93 mg/dL (ref 65–99)
POTASSIUM: 4 mmol/L (ref 3.5–5.3)
SODIUM: 140 mmol/L (ref 135–146)
TOTAL PROTEIN: 6.8 g/dL (ref 6.1–8.1)

## 2016-02-04 LAB — LIPID PANEL
CHOL/HDL RATIO: 3.9 ratio (ref <=5.0)
Cholesterol: 137 mg/dL (ref 125–200)
HDL: 35 mg/dL — AB (ref >=46)
LDL CALC: 82 mg/dL (ref <130)
TRIGLYCERIDES: 98 mg/dL (ref <150)
VLDL: 20 mg/dL (ref <30)

## 2016-02-07 MED FILL — POLY-IRON 150 MG CAPSULE: 150 | 30 days supply | Qty: 30 | Fill #5

## 2016-02-16 ENCOUNTER — Ambulatory Visit: Payer: 59 | Admitting: Family Medicine

## 2016-03-02 ENCOUNTER — Encounter: Payer: Self-pay | Admitting: Family Medicine

## 2016-03-02 ENCOUNTER — Ambulatory Visit (INDEPENDENT_AMBULATORY_CARE_PROVIDER_SITE_OTHER): Payer: 59 | Admitting: Family Medicine

## 2016-03-02 VITALS — BP 124/84 | Ht 63.0 in | Wt 170.0 lb

## 2016-03-02 DIAGNOSIS — M62838 Other muscle spasm: Secondary | ICD-10-CM

## 2016-03-02 DIAGNOSIS — R350 Frequency of micturition: Secondary | ICD-10-CM | POA: Diagnosis not present

## 2016-03-02 DIAGNOSIS — IMO0001 Reserved for inherently not codable concepts without codable children: Secondary | ICD-10-CM

## 2016-03-02 DIAGNOSIS — I10 Essential (primary) hypertension: Secondary | ICD-10-CM

## 2016-03-02 DIAGNOSIS — E785 Hyperlipidemia, unspecified: Secondary | ICD-10-CM | POA: Diagnosis not present

## 2016-03-02 DIAGNOSIS — K219 Gastro-esophageal reflux disease without esophagitis: Secondary | ICD-10-CM

## 2016-03-02 DIAGNOSIS — E6609 Other obesity due to excess calories: Secondary | ICD-10-CM

## 2016-03-02 MED ORDER — ACETAMINOPHEN 500 MG PO TABS: 500.0000 mg | ORAL_TABLET | Freq: Three times a day (TID) | ORAL | 0 refills | Status: DC | PRN

## 2016-03-02 MED ORDER — CYCLOBENZAPRINE HCL 5 MG PO TABS: ORAL_TABLET | ORAL | 0 refills | Status: DC

## 2016-03-02 NOTE — Patient Instructions (Addendum)
F/u in 4 months, call if you need me sooner  Fasting lipid and cmp and tSH in 4 month  Urine being tested in office for infection.  Medications sent to pharmacy for neck pain and spasm    Good blood pressure  Congrats on weight loss  Increase exercise to improve good cholesterol  Continue regular meds  Pls schedule and get mammogram in December when it is due  [facility] that you get the Professional help that you need as you make decisions regarding your Aunt

## 2016-03-03 LAB — POCT URINALYSIS DIPSTICK
Bilirubin, UA: NEGATIVE
Blood, UA: NEGATIVE
GLUCOSE UA: NEGATIVE
Ketones, UA: NEGATIVE
Leukocytes, UA: NEGATIVE
Nitrite, UA: NEGATIVE
Protein, UA: NEGATIVE
SPEC GRAV UA: 1.02
UROBILINOGEN UA: 0.2
pH, UA: 7

## 2016-03-03 MED FILL — CYCLOBENZAPRINE 5 MG TABLET: 5 | 20 days supply | Qty: 20 | Fill #0

## 2016-03-05 DIAGNOSIS — R350 Frequency of micturition: Secondary | ICD-10-CM | POA: Insufficient documentation

## 2016-03-05 DIAGNOSIS — M62838 Other muscle spasm: Secondary | ICD-10-CM | POA: Insufficient documentation

## 2016-03-05 NOTE — Assessment & Plan Note (Signed)
Improved. Pt applauded on succesful weight loss through lifestyle change, and encouraged to continue same. Weight loss goal set for the next several months.  

## 2016-03-05 NOTE — Assessment & Plan Note (Signed)
Short term muscle relaxant as needed

## 2016-03-05 NOTE — Assessment & Plan Note (Signed)
Normal UA, reassured no infection

## 2016-03-05 NOTE — Assessment & Plan Note (Signed)
Hyperlipidemia:Low fat diet discussed and encouraged.   Lipid Panel  Lab Results  Component Value Date   CHOL 137 02/04/2016   HDL 35 (L) 02/04/2016   LDLCALC 82 02/04/2016   TRIG 98 02/04/2016   CHOLHDL 3.9 02/04/2016  needs to increase exercise , otherwise controlled

## 2016-03-05 NOTE — Progress Notes (Signed)
Kristin Hall     MRN: [id number]      DOB: [birth year]   HPI Kristin Hall is here for follow up and re-evaluation of chronic medical conditions, medication management and review of any available recent lab and radiology data.  Preventive health is updated, specifically  Cancer screening and Immunization.   Questions or concerns regarding consultations or procedures which the PT has had in the interim are  addressed. The PT denies any adverse reactions to current medications since the last visit.  2 week h/o left neck pain and spasm after lifting heavy flower pots, at times radiates to upper left extremity, denies weakness or numbness of same C/o increased stress and anxiety , due to deterioration in the health of a close family member ROS Denies recent fever or chills. Denies sinus pressure, nasal congestion, ear pain or sore throat. Denies chest congestion, productive cough or wheezing. Denies chest pains, palpitations and leg swelling Denies abdominal pain, nausea, vomiting,diarrhea or constipation.   Denies dysuria,, hesitancy or incontinence.c/o urinary frequenct for 3 weeks  Denies headaches, seizures, numbness, or tingling. Denies skin break down or rash.   PE  BP 124/84   Ht 5\' 3"  (1.6 m)   Wt 170 lb (77.1 kg)   SpO2 98%   BMI 30.11 kg/m   Patient alert and oriented and in no cardiopulmonary distress.  HEENT: No facial asymmetry, EOMI,   oropharynx pink and moist.  Neck decreased tho adequate ROM with left trapezius spasm, no JVD, no mass.  Chest: Clear to auscultation bilaterally.  CVS: S1, S2 no murmurs, no S3.Regular rate.  ABD: Soft non tender.   Ext: No edema  MS: Adequate ROM spine, shoulders, hips and knees.  Skin: Intact, no ulcerations or rash noted.  Psych: Good eye contact, normal affect. Memory intact mildly anxious not depressed appearing.  CNS: CN 2-12 intact, power,  normal throughout.no focal deficits noted.   Assessment & Plan  Essential  hypertension Controlled, no change in medication DASH diet and commitment to daily physical activity for a minimum of 30 minutes discussed and encouraged, as a part of hypertension management. The importance of attaining a healthy weight is also discussed.  BP/Weight 03/02/2016 12/09/2015 09/16/2015 08/09/2015 08/09/2015 07/23/2015 06/24/2015  Systolic BP 124 149 128 135 138 135 144  Diastolic BP 84 62 84 56 88 76 90  Wt. (Lbs) 170 179.3 176.12 176.9 177 175.3 173  BMI 30.11 31.76 31.21 31.34 31.36 31.06 30.65       Hyperlipemia Hyperlipidemia:Low fat diet discussed and encouraged.   Lipid Panel  Lab Results  Component Value Date   CHOL 137 02/04/2016   HDL 35 (L) 02/04/2016   LDLCALC 82 02/04/2016   TRIG 98 02/04/2016   CHOLHDL 3.9 02/04/2016  needs to increase exercise , otherwise controlled      Obesity Improved. Pt applauded on succesful weight loss through lifestyle change, and encouraged to continue same. Weight loss goal set for the next several months.   GERD Controlled, no change in medication   Neck muscle spasm Short term muscle relaxant as needed  Urinary frequency Normal UA, reassured no infection

## 2016-03-05 NOTE — Assessment & Plan Note (Signed)
Controlled, no change in medication  

## 2016-03-05 NOTE — Assessment & Plan Note (Signed)
Controlled, no change in medication DASH diet and commitment to daily physical activity for a minimum of 30 minutes discussed and encouraged, as a part of hypertension management. The importance of attaining a healthy weight is also discussed.  BP/Weight 03/02/2016 12/09/2015 09/16/2015 08/09/2015 08/09/2015 [id number]  Systolic BP A999333 123456 0000000 A999333 0000000 A999333 123456  Diastolic BP 84 62 84 56 88 76 90  Wt. (Lbs) 170 179.3 176.12 176.9 177 175.3 173  BMI 30.11 31.76 31.21 31.34 31.36 31.06 30.65

## 2016-03-14 DIAGNOSIS — H40003 Preglaucoma, unspecified, bilateral: Secondary | ICD-10-CM | POA: Diagnosis not present

## 2016-03-15 ENCOUNTER — Other Ambulatory Visit (HOSPITAL_COMMUNITY): Payer: Self-pay | Admitting: Hematology & Oncology

## 2016-03-16 MED FILL — POLY-IRON 150 MG CAPSULE: 150 | 30 days supply | Qty: 30 | Fill #0

## 2016-04-11 ENCOUNTER — Other Ambulatory Visit: Payer: Self-pay | Admitting: Family Medicine

## 2016-04-11 DIAGNOSIS — Z1231 Encounter for screening mammogram for malignant neoplasm of breast: Secondary | ICD-10-CM

## 2016-04-11 MED FILL — VALSARTAN-HCTZ 320-25 MG TA: 320-25 | 90 days supply | Qty: 90 | Fill #1

## 2016-04-11 MED FILL — KLOR-CON M20 TABLET: 20 | 90 days supply | Qty: 180 | Fill #3

## 2016-04-11 MED FILL — PANTOPRAZOLE SOD DR 40 MG T: 40 | 90 days supply | Qty: 90 | Fill #1

## 2016-04-11 MED FILL — POLY-IRON 150 MG CAPSULE: 150 | 30 days supply | Qty: 30 | Fill #1

## 2016-04-11 MED FILL — DILTIAZEM HCL ER 300 MG CAP: 300 | 90 days supply | Qty: 90 | Fill #0

## 2016-04-14 ENCOUNTER — Ambulatory Visit (HOSPITAL_COMMUNITY): Payer: 59 | Admitting: Oncology

## 2016-04-14 ENCOUNTER — Other Ambulatory Visit (HOSPITAL_COMMUNITY): Payer: 59

## 2016-05-01 ENCOUNTER — Encounter (HOSPITAL_COMMUNITY): Payer: Self-pay | Admitting: Oncology

## 2016-05-01 ENCOUNTER — Encounter (HOSPITAL_COMMUNITY): Payer: 59

## 2016-05-01 ENCOUNTER — Encounter (HOSPITAL_COMMUNITY): Payer: 59 | Attending: Oncology | Admitting: Oncology

## 2016-05-01 DIAGNOSIS — Z803 Family history of malignant neoplasm of breast: Secondary | ICD-10-CM | POA: Insufficient documentation

## 2016-05-01 DIAGNOSIS — E785 Hyperlipidemia, unspecified: Secondary | ICD-10-CM | POA: Insufficient documentation

## 2016-05-01 DIAGNOSIS — Z800 Family history of malignant neoplasm of digestive organs: Secondary | ICD-10-CM | POA: Insufficient documentation

## 2016-05-01 DIAGNOSIS — E611 Iron deficiency: Secondary | ICD-10-CM | POA: Diagnosis not present

## 2016-05-01 DIAGNOSIS — D473 Essential (hemorrhagic) thrombocythemia: Secondary | ICD-10-CM | POA: Diagnosis not present

## 2016-05-01 DIAGNOSIS — Z801 Family history of malignant neoplasm of trachea, bronchus and lung: Secondary | ICD-10-CM | POA: Insufficient documentation

## 2016-05-01 DIAGNOSIS — Z820 Family history of epilepsy and other diseases of the nervous system: Secondary | ICD-10-CM | POA: Diagnosis not present

## 2016-05-01 DIAGNOSIS — Z9889 Other specified postprocedural states: Secondary | ICD-10-CM | POA: Insufficient documentation

## 2016-05-01 DIAGNOSIS — I10 Essential (primary) hypertension: Secondary | ICD-10-CM | POA: Diagnosis not present

## 2016-05-01 DIAGNOSIS — D75839 Thrombocytosis, unspecified: Secondary | ICD-10-CM

## 2016-05-01 LAB — CBC
HEMATOCRIT: 38.6 % (ref 36.0–46.0)
HEMOGLOBIN: 12.4 g/dL (ref 12.0–15.0)
MCH: 28.9 pg (ref 26.0–34.0)
MCHC: 32.1 g/dL (ref 30.0–36.0)
MCV: 90 fL (ref 78.0–100.0)
Platelets: 411 10*3/uL — ABNORMAL HIGH (ref 150–400)
RBC: 4.29 MIL/uL (ref 3.87–5.11)
RDW: 13.2 % (ref 11.5–15.5)
WBC: 11.4 10*3/uL — AB (ref 4.0–10.5)

## 2016-05-01 LAB — IRON AND TIBC
Iron: 45 ug/dL (ref 28–170)
Saturation Ratios: 12 % (ref 10.4–31.8)
TIBC: 363 ug/dL (ref 250–450)
UIBC: 318 ug/dL

## 2016-05-01 LAB — FERRITIN: Ferritin: 69 ng/mL (ref 11–307)

## 2016-05-01 NOTE — Patient Instructions (Signed)
[place] at [facility] Discharge Instructions  RECOMMENDATIONS MADE BY THE CONSULTANT AND ANY TEST RESULTS WILL BE SENT TO YOUR REFERRING PHYSICIAN.  You were seen today by Kirby Crigler PA-C. You have been released from the clinic. Follow up with Dr. Moshe Cipro as directed.   Thank you for choosing [place] at [facility] to provide your oncology and hematology care.  To afford each patient quality time with our provider, please arrive at least 15 minutes before your scheduled appointment time.   Beginning January 23rd 2017 lab work for the [facility] will be done in the  Main lab at [facility] on 1st floor. If you have a lab appointment with the [facility] please come in thru the  Main Entrance and check in at the main information desk  You need to re-schedule your appointment should you arrive 10 or more minutes late.  We strive to give you quality time with our providers, and arriving late affects you and other patients whose appointments are after yours.  Also, if you no show three or more times for appointments you may be dismissed from the clinic at the providers discretion.     Again, thank you for choosing [facility].  Our hope is that these requests will decrease the amount of time that you wait before being seen by our physicians.       _____________________________________________________________  Should you have questions after your visit to [facility], please contact our office at [phone] between the hours of 8:30 a.m. and 4:30 p.m.  Voicemails left after 4:30 p.m. will not be returned until the following business day.  For prescription refill requests, have your pharmacy contact our office.         Resources For Cancer Patients and their Caregivers ? American Cancer Society: Can assist with transportation, wigs, general needs, runs Look Good Feel Better.        775-513-1867 ? Cancer  Care: Provides financial assistance, online support groups, medication/co-pay assistance.  1-800-813-HOPE (254)271-7962) ? [place] Assists [place] cancer patients and their families through emotional , educational and financial support.  865-311-2721 ? [place] DSS Where to apply for food stamps, Medicaid and utility assistance. 626-317-8223 ? RCATS: Transportation to medical appointments. 360-876-9467 ? Social Security Administration: May apply for disability if have a Stage IV cancer. [phone])160-3532 [phone])466-7704 ? [employer], Disability and Transit Services: Assists with nutrition, care and transit needs. [place] Support Programs: @10RELATIVEDAYS @ > Cancer Support Group  2nd Tuesday of the month 1pm-2pm, Journey Room  > Creative Journey  3rd Tuesday of the month 1130am-1pm, Journey Room  > Look Good Feel Better  1st Wednesday of the month 10am-12 noon, Journey Room (Call [place] to register [phone])

## 2016-05-01 NOTE — Assessment & Plan Note (Addendum)
Minimal thrombocytosis dating back to at least January 2010 with intermittent upper limits of normal platelet counts. Preservation of WBC, RBC, hemoglobin and hematocrit is noted. Work-up was unrevealing except for a mild iron deficiency which can cause a mild thrombocytosis.  As a result, Kristin Hall was started on PO iron replacement in March 2017.  Labs today: CBC diff, ferritin.  I personally reviewed and went over laboratory results with the patient.  The results are noted within this dictation.  Platelet count is higher-limites normal to minimally elevated.  It is improved of PO iron replacement.  Kristin Hall will continue with PO iron as directed.  Kristin Hall is tolerating this very well.   Release from clinic with ongoing follow-up with primary care provider.

## 2016-05-01 NOTE — Progress Notes (Signed)
Kristin Nakayama, MD [address] [place] [address]  Thrombocytosis [facility])  CURRENT THERAPY: Niferex 150 mg PO daily beginning on 08/09/2015.  INTERVAL HISTORY: Kristin Hall 60 y.o. female returns for followup of minimal thrombocytosis dating back to at least 2010.  Work-up was unrevealing except for a mild iron deficiency which can cause a mild thrombocytosis.  As a result, she was started on PO iron replacement in March 2017.  She is doing very well.  She is tolerating PO iron without any complaints.  She notes compliance with her PO iron.  Review of Systems  Constitutional: Negative.  Negative for chills, fever and weight loss.  HENT: Negative.   Eyes: Negative.   Respiratory: Negative.   Cardiovascular: Negative.  Negative for leg swelling.  Gastrointestinal: Negative.  Negative for abdominal pain, constipation, diarrhea, nausea and vomiting.  Genitourinary: Negative.   Musculoskeletal: Negative.   Skin: Negative.   Neurological: Negative.  Negative for weakness.  Endo/Heme/Allergies: Negative.   Psychiatric/Behavioral: Negative.     Past Medical History:  Diagnosis Date  . Elevated alkaline phosphatase level   . Hyperlipidemia   . Hypertension   . Thrombocytosis ([place]) 07/23/2015    Past Surgical History:  Procedure Laterality Date  . ABDOMINAL HYSTERECTOMY     partial   . COLONOSCOPY  10/04/2006   SLF: Normal retroflexed view of the rectum/Normal colon without evidence of polyps, masses, inflammatory changes, diverticula or arteriovenous malformations    Family History  Problem Relation Age of Onset  . Cancer Mother     Breast  . Dementia Mother   . Cancer Father     Lung Cancer, deceased at 40  . Colon cancer Neg Hx   . Liver disease Neg Hx     Social History   Social History  . Marital status: Widowed    Spouse name: N/A  . Number of children: N/A  . Years of education: N/A   Social History Main Topics  . Smoking  status: Never Smoker  . Smokeless tobacco: Never Used  . Alcohol use No  . Drug use: No  . Sexual activity: Yes   Other Topics Concern  . None   Social History Narrative  . None     PHYSICAL EXAMINATION  ECOG PERFORMANCE STATUS: 0 - Asymptomatic  Vitals:   05/01/16 1344  BP: (!) 129/52  Pulse: 70  Resp: 16  Temp: 98.1 F (36.7 C)    GENERAL:alert, no distress, well nourished, well developed, comfortable, cooperative, obese, smiling and unaccompanied SKIN: skin color, texture, turgor are normal, no rashes or significant lesions HEAD: Normocephalic, No masses, lesions, tenderness or abnormalities EYES: normal, EOMI, Conjunctiva are pink and non-injected EARS: External ears normal OROPHARYNX:lips, buccal mucosa, and tongue normal and mucous membranes are moist  NECK: supple, trachea midline LYMPH:  no palpable lymphadenopathy BREAST:not examined LUNGS: clear to auscultation  HEART: regular rate & rhythm, no murmurs, no gallops, S1 normal and S2 normal ABDOMEN:abdomen soft, non-tender and normal bowel sounds BACK: Back symmetric, no curvature. EXTREMITIES:less then 2 second capillary refill, no joint deformities, effusion, or inflammation, no skin discoloration, no cyanosis  NEURO: alert & oriented x 3 with fluent speech, no focal motor/sensory deficits, gait normal   LABORATORY DATA: CBC    Component Value Date/Time   WBC 11.4 (H) 05/01/2016 1236   RBC 4.29 05/01/2016 1236   HGB 12.4 05/01/2016 1236   HCT 38.6 05/01/2016 1236   PLT 411 (  H) 05/01/2016 1236   MCV 90.0 05/01/2016 1236   MCH 28.9 05/01/2016 1236   MCHC 32.1 05/01/2016 1236   RDW 13.2 05/01/2016 1236   LYMPHSABS 2.7 12/09/2015 0836   MONOABS 0.5 12/09/2015 0836   EOSABS 0.1 12/09/2015 0836   BASOSABS 0.0 12/09/2015 0836      Chemistry      Component Value Date/Time   NA 140 02/04/2016 0856   K 4.0 02/04/2016 0856   CL 103 02/04/2016 0856   CO2 29 02/04/2016 0856   BUN 9 02/04/2016 0856    CREATININE 0.88 02/04/2016 0856      Component Value Date/Time   CALCIUM 9.5 02/04/2016 0856   ALKPHOS 124 02/04/2016 0856   AST 20 02/04/2016 0856   ALT 16 02/04/2016 0856   BILITOT 0.5 02/04/2016 0856     Lab Results  Component Value Date   IRON 45 05/01/2016   TIBC 363 05/01/2016   FERRITIN 69 05/01/2016     PENDING LABS:   RADIOGRAPHIC STUDIES:  No results found.   PATHOLOGY:    ASSESSMENT AND PLAN:  Thrombocytosis ([place]) Minimal thrombocytosis dating back to at least January 2010 with intermittent upper limits of normal platelet counts. Preservation of WBC, RBC, hemoglobin and hematocrit is noted. Work-up was unrevealing except for a mild iron deficiency which can cause a mild thrombocytosis.  As a result, she was started on PO iron replacement in March 2017.  Labs today: CBC diff, ferritin.  I personally reviewed and went over laboratory results with the patient.  The results are noted within this dictation.  Platelet count is higher-limites normal to minimally elevated.  It is improved of PO iron replacement.  She will continue with PO iron as directed.  She is tolerating this very well.   Release from clinic with ongoing follow-up with primary care provider.   ORDERS PLACED FOR THIS ENCOUNTER: No orders of the defined types were placed in this encounter.   MEDICATIONS PRESCRIBED THIS ENCOUNTER: No orders of the defined types were placed in this encounter.   THERAPY PLAN:  Continue PO iron as directed.  Will release the patient from the clinic to follow with her primary care provider.   All questions were answered. The patient knows to call the clinic with any problems, questions or concerns. We can certainly see the patient much sooner if necessary.  Patient and plan discussed with Dr. Ancil Linsey and she is in agreement with the aforementioned.   This note is electronically signed by: Doy Mince 05/01/2016 6:43 PM

## 2016-05-24 MED FILL — POLY-IRON 150 MG CAPSULE: 150 | 30 days supply | Qty: 30 | Fill #2

## 2016-05-25 ENCOUNTER — Ambulatory Visit (HOSPITAL_COMMUNITY): Payer: 59

## 2016-06-18 ENCOUNTER — Emergency Department (HOSPITAL_COMMUNITY): Payer: 59

## 2016-06-18 ENCOUNTER — Other Ambulatory Visit: Payer: Self-pay

## 2016-06-18 ENCOUNTER — Encounter (HOSPITAL_COMMUNITY): Payer: Self-pay | Admitting: Emergency Medicine

## 2016-06-18 ENCOUNTER — Observation Stay (HOSPITAL_COMMUNITY)
Admission: EM | Admit: 2016-06-18 | Discharge: 2016-06-19 | Disposition: A | Discharge status: Home / Self Care | Payer: 59 | Attending: Family Medicine | Admitting: Family Medicine

## 2016-06-18 DIAGNOSIS — R51 Headache: Secondary | ICD-10-CM | POA: Diagnosis not present

## 2016-06-18 DIAGNOSIS — I10 Essential (primary) hypertension: Secondary | ICD-10-CM | POA: Insufficient documentation

## 2016-06-18 DIAGNOSIS — R55 Syncope and collapse: Secondary | ICD-10-CM

## 2016-06-18 DIAGNOSIS — K219 Gastro-esophageal reflux disease without esophagitis: Secondary | ICD-10-CM | POA: Diagnosis present

## 2016-06-18 DIAGNOSIS — J09X2 Influenza due to identified novel influenza A virus with other respiratory manifestations: Secondary | ICD-10-CM | POA: Insufficient documentation

## 2016-06-18 DIAGNOSIS — I951 Orthostatic hypotension: Principal | ICD-10-CM | POA: Insufficient documentation

## 2016-06-18 DIAGNOSIS — N390 Urinary tract infection, site not specified: Secondary | ICD-10-CM | POA: Insufficient documentation

## 2016-06-18 DIAGNOSIS — E876 Hypokalemia: Secondary | ICD-10-CM | POA: Insufficient documentation

## 2016-06-18 DIAGNOSIS — J101 Influenza due to other identified influenza virus with other respiratory manifestations: Secondary | ICD-10-CM

## 2016-06-18 DIAGNOSIS — Z79899 Other long term (current) drug therapy: Secondary | ICD-10-CM | POA: Insufficient documentation

## 2016-06-18 DIAGNOSIS — E785 Hyperlipidemia, unspecified: Secondary | ICD-10-CM | POA: Diagnosis present

## 2016-06-18 DIAGNOSIS — E860 Dehydration: Secondary | ICD-10-CM | POA: Diagnosis not present

## 2016-06-18 HISTORY — DX: Headache, unspecified: R51.9

## 2016-06-18 HISTORY — DX: Headache: R51

## 2016-06-18 LAB — COMPREHENSIVE METABOLIC PANEL
ALBUMIN: 4 g/dL (ref 3.5–5.0)
ALK PHOS: 112 U/L (ref 38–126)
ALT: 30 U/L (ref 14–54)
ANION GAP: 12 (ref 5–15)
AST: 53 U/L — ABNORMAL HIGH (ref 15–41)
BILIRUBIN TOTAL: 1 mg/dL (ref 0.3–1.2)
BUN: 23 mg/dL — AB (ref 6–20)
CALCIUM: 9.3 mg/dL (ref 8.9–10.3)
CO2: 25 mmol/L (ref 22–32)
Chloride: 99 mmol/L — ABNORMAL LOW (ref 101–111)
Creatinine, Ser: 1.15 mg/dL — ABNORMAL HIGH (ref 0.44–1.00)
GFR calc Af Amer: 59 mL/min — ABNORMAL LOW (ref >60)
GFR, EST NON AFRICAN AMERICAN: 51 mL/min — AB (ref >60)
Glucose, Bld: 122 mg/dL — ABNORMAL HIGH (ref 65–99)
Potassium: 3.3 mmol/L — ABNORMAL LOW (ref 3.5–5.1)
Sodium: 136 mmol/L (ref 135–145)
TOTAL PROTEIN: 7.9 g/dL (ref 6.5–8.1)

## 2016-06-18 LAB — URINALYSIS, MICROSCOPIC (REFLEX)

## 2016-06-18 LAB — CBC WITH DIFFERENTIAL/PLATELET
Basophils Absolute: 0 10*3/uL (ref 0.0–0.1)
Basophils Relative: 0 %
EOS PCT: 0 %
Eosinophils Absolute: 0 10*3/uL (ref 0.0–0.7)
HCT: 41.6 % (ref 36.0–46.0)
Hemoglobin: 13.8 g/dL (ref 12.0–15.0)
LYMPHS ABS: 2.5 10*3/uL (ref 0.7–4.0)
Lymphocytes Relative: 30 %
MCH: 29.1 pg (ref 26.0–34.0)
MCHC: 33.2 g/dL (ref 30.0–36.0)
MCV: 87.8 fL (ref 78.0–100.0)
MONO ABS: 0.7 10*3/uL (ref 0.1–1.0)
MONOS PCT: 9 %
Neutro Abs: 5.2 10*3/uL (ref 1.7–7.7)
Neutrophils Relative %: 61 %
PLATELETS: 388 10*3/uL (ref 150–400)
RBC: 4.74 MIL/uL (ref 3.87–5.11)
RDW: 13.3 % (ref 11.5–15.5)
WBC: 8.5 10*3/uL (ref 4.0–10.5)

## 2016-06-18 LAB — URINALYSIS, ROUTINE W REFLEX MICROSCOPIC
GLUCOSE, UA: NEGATIVE mg/dL
Leukocytes, UA: NEGATIVE
NITRITE: NEGATIVE
PH: 5.5 (ref 5.0–8.0)
Protein, ur: 30 mg/dL — AB

## 2016-06-18 LAB — INFLUENZA PANEL BY PCR (TYPE A & B)
Influenza A By PCR: POSITIVE — AB
Influenza B By PCR: NEGATIVE

## 2016-06-18 LAB — LIPASE, BLOOD: LIPASE: 20 U/L (ref 11–51)

## 2016-06-18 LAB — MAGNESIUM: Magnesium: 2.1 mg/dL (ref 1.7–2.4)

## 2016-06-18 LAB — TROPONIN I: Troponin I: <0.03 ng/mL (ref <0.03)

## 2016-06-18 LAB — LACTIC ACID, PLASMA: Lactic Acid, Venous: 1.8 mmol/L (ref 0.5–1.9)

## 2016-06-18 MED ORDER — ACETAMINOPHEN 325 MG PO TABS
650.0000 mg | ORAL_TABLET | Freq: Four times a day (QID) | ORAL | Status: DC | PRN
  Administered 2016-06-19: 650 mg via ORAL
  Filled 2016-06-18: qty 2

## 2016-06-18 MED ORDER — KETOROLAC TROMETHAMINE 15 MG/ML IJ SOLN
15.0000 mg | Freq: Once | INTRAMUSCULAR | Status: AC
  Administered 2016-06-18: 15 mg via INTRAVENOUS
  Filled 2016-06-18 (×2): qty 1

## 2016-06-18 MED ORDER — SODIUM CHLORIDE 0.9 % IV BOLUS (SEPSIS)
1000.0000 mL | Freq: Once | INTRAVENOUS | Status: AC
  Administered 2016-06-18: 1000 mL via INTRAVENOUS

## 2016-06-18 MED ORDER — PANTOPRAZOLE SODIUM 40 MG PO TBEC
40.0000 mg | DELAYED_RELEASE_TABLET | Freq: Every day | ORAL | Status: DC
  Administered 2016-06-18 – 2016-06-19 (×2): 40 mg via ORAL
  Filled 2016-06-18 (×2): qty 1

## 2016-06-18 MED ORDER — POTASSIUM CHLORIDE IN NACL 20-0.9 MEQ/L-% IV SOLN
INTRAVENOUS | Status: DC
  Administered 2016-06-18: 17:00:00 via INTRAVENOUS

## 2016-06-18 MED ORDER — SODIUM CHLORIDE 0.9% FLUSH
3.0000 mL | Freq: Two times a day (BID) | INTRAVENOUS | Status: DC
  Administered 2016-06-18: 3 mL via INTRAVENOUS

## 2016-06-18 MED ORDER — POTASSIUM CHLORIDE CRYS ER 20 MEQ PO TBCR
40.0000 meq | EXTENDED_RELEASE_TABLET | Freq: Every day | ORAL | Status: DC
  Administered 2016-06-19: 40 meq via ORAL
  Filled 2016-06-18: qty 2

## 2016-06-18 MED ORDER — CYCLOBENZAPRINE HCL 10 MG PO TABS: 5.0000 mg | ORAL_TABLET | Freq: Two times a day (BID) | ORAL | Status: DC | PRN

## 2016-06-18 MED ORDER — DEXTROSE 5 % IV SOLN
1.0000 g | Freq: Once | INTRAVENOUS | Status: AC
  Administered 2016-06-18: 1 g via INTRAVENOUS
  Filled 2016-06-18: qty 10

## 2016-06-18 MED ORDER — ONDANSETRON HCL 4 MG PO TABS: 4.0000 mg | ORAL_TABLET | Freq: Four times a day (QID) | ORAL | Status: DC | PRN

## 2016-06-18 MED ORDER — OSELTAMIVIR PHOSPHATE 75 MG PO CAPS
75.0000 mg | ORAL_CAPSULE | Freq: Once | ORAL | Status: AC
  Administered 2016-06-18: 75 mg via ORAL
  Filled 2016-06-18: qty 1

## 2016-06-18 MED ORDER — POTASSIUM CHLORIDE CRYS ER 20 MEQ PO TBCR
40.0000 meq | EXTENDED_RELEASE_TABLET | Freq: Once | ORAL | Status: AC
  Administered 2016-06-18: 40 meq via ORAL
  Filled 2016-06-18: qty 2

## 2016-06-18 MED ORDER — ENOXAPARIN SODIUM 40 MG/0.4ML ~~LOC~~ SOLN
40.0000 mg | SUBCUTANEOUS | Status: DC
  Administered 2016-06-18: 40 mg via SUBCUTANEOUS
  Filled 2016-06-18: qty 0.4

## 2016-06-18 MED ORDER — DEXTROSE 5 % IV SOLN
1.0000 g | INTRAVENOUS | Status: DC
  Filled 2016-06-18: qty 10

## 2016-06-18 MED ORDER — CYCLOBENZAPRINE HCL 10 MG PO TABS: 5.0000 mg | ORAL_TABLET | Freq: Every evening | ORAL | Status: DC | PRN

## 2016-06-18 MED ORDER — ONDANSETRON HCL 4 MG/2ML IJ SOLN: 4.0000 mg | Freq: Four times a day (QID) | INTRAMUSCULAR | Status: DC | PRN

## 2016-06-18 MED ORDER — OSELTAMIVIR PHOSPHATE 30 MG PO CAPS
30.0000 mg | ORAL_CAPSULE | Freq: Two times a day (BID) | ORAL | Status: DC
  Administered 2016-06-18 – 2016-06-19 (×2): 30 mg via ORAL
  Filled 2016-06-18 (×2): qty 1

## 2016-06-18 MED ORDER — OSELTAMIVIR PHOSPHATE 75 MG PO CAPS: 75.0000 mg | ORAL_CAPSULE | Freq: Two times a day (BID) | ORAL | Status: DC

## 2016-06-18 NOTE — ED Provider Notes (Signed)
[facility] DEPT Provider Note   CSN: [id number] Arrival date & time: 06/18/16  1206     History   Chief Complaint Chief Complaint  Patient presents with  . Loss of Consciousness    HPI Kristin Hall is a 61 y.o. female.   Loss of Consciousness      Pt was seen at 1220. Per pt, c/o gradual onset and persistence of constant multiple symptoms for the past 2 days. Symptoms include: "feeling hot," generalized weakness/fatigue, diarrhea, cough, decreased PO intake, frontal headache and "dizziness." Pt states she had one episode of syncope at midnight after waking up to go to the bathroom. Denies abd pain, no CP/palpitations, no SOB, no back or neck pain, no objective fevers, no black or blood in stools, no rash.    Past Medical History:  Diagnosis Date  . Elevated alkaline phosphatase level   . Headache   . Hyperlipidemia   . Hypertension   . Thrombocytosis ([place]) 07/23/2015    Patient Active Problem List   Diagnosis Date Noted  . Neck muscle spasm 03/05/2016  . Urinary frequency 03/05/2016  . Iron deficiency 08/09/2015  . Thrombocytosis ([place]) 07/23/2015  . Insomnia 05/31/2015  . Obesity 01/01/2015  . Elevated alkaline phosphatase level 04/21/2012  . 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 GERD 04/20/2010  . Hyperlipemia 11/12/2007  . Essential hypertension 11/12/2007    Past Surgical History:  Procedure Laterality Date  . ABDOMINAL HYSTERECTOMY     partial   . COLONOSCOPY  10/04/2006   SLF: Normal retroflexed view of the rectum/Normal colon without evidence of polyps, masses, inflammatory changes, diverticula or arteriovenous malformations    OB History    No data available       Home Medications    Prior to Admission medications   Medication Sig Start Date End Date Taking? Authorizing Provider  acetaminophen (TYLENOL) 500 MG tablet Take 1 tablet (500 mg total) by mouth every 8 (eight) hours as needed. 03/02/16   Fayrene Helper, MD  cyclobenzaprine  (FLEXERIL) 5 MG tablet One tablet at bedtime as needed, for neck spasm 03/02/16   Fayrene Helper, MD  diltiazem [facility]) 300 MG 24 hr capsule TAKE 1 CAPSULE BY MOUTH ONCE DAILY 04/11/16   Fayrene Helper, MD  Multiple Vitamins-Minerals (ALIVE WOMENS 50+) TABS Take 1 tablet by mouth daily.    Historical Provider, MD  pantoprazole (PROTONIX) 40 MG tablet TAKE 1 TABLET BY MOUTH DAILY. 12/28/15   Fayrene Helper, MD  POLY-IRON 150 150 MG capsule TAKE 1 CAPSULE (150 MG TOTAL) BY MOUTH DAILY. 03/16/16   Patrici Ranks, MD  potassium chloride SA (K-DUR,KLOR-CON) 20 MEQ tablet Take 2 tablets (40 mEq total) by mouth daily. 06/24/15   Herminio Commons, MD  valsartan-hydrochlorothiazide (DIOVAN-HCT) 320-25 MG tablet TAKE 1 TABLET BY MOUTH ONCE DAILY 01/14/16   Fayrene Helper, MD  VYTORIN 10-10 MG per tablet TAKE 1 TABLET BY MOUTH AT BEDTIME FOR CHOLESTEROL. 08/04/14   Fayrene Helper, MD    Family History Family History  Problem Relation Age of Onset  . Cancer Mother     Breast  . Dementia Mother   . Cancer Father     Lung Cancer, deceased at 26  . Colon cancer Neg Hx   . Liver disease Neg Hx     Social History Social History  Substance Use Topics  . Smoking status: Never Smoker  . Smokeless tobacco: Never Used  . Alcohol use No  Allergies   Amlodipine; Aspirin; Metronidazole; and Valsartan   Review of Systems Review of Systems  Cardiovascular: Positive for syncope.  ROS: Statement: All systems negative except as marked or noted in the HPI; Constitutional: Negative for objective fever. +generalized weakness/fatigue. ; ; Eyes: Negative for eye pain, redness and discharge. ; ; ENMT: Negative for ear pain, hoarseness, nasal congestion, sinus pressure and sore throat. ; ; Cardiovascular: Negative for chest pain, palpitations, diaphoresis, dyspnea and peripheral edema. ; ; Respiratory: +cough. Negative for wheezing and stridor. ; ; Gastrointestinal: +diarrhea. Negative for  nausea, vomiting, abdominal pain, blood in stool, hematemesis, jaundice and rectal bleeding. . ; ; Genitourinary: Negative for dysuria, flank pain and hematuria. ; ; Musculoskeletal: Negative for back pain and neck pain. Negative for swelling and trauma.; ; Skin: Negative for pruritus, rash, abrasions, blisters, bruising and skin lesion.; ; Neuro: +syncope, "dizziness," headache. Negative for lightheadedness and neck stiffness. Negative for altered mental status, extremity weakness, paresthesias, involuntary movement, seizure.       Physical Exam Updated Vital Signs BP 124/92 (BP Location: Left Arm)   Pulse 116   Temp 98.7 F (37.1 C) (Oral)   Resp 18   Ht 5\' 3"  (1.6 m)   Wt 180 lb (81.6 kg)   SpO2 98%   BMI 31.89 kg/m    12:43 Orthostatic Vital Signs TD  Orthostatic Lying   BP- Lying: 112/70  Pulse- Lying: 106      Orthostatic Sitting  BP- Sitting: 103/67  Pulse- Sitting: 105      Orthostatic Standing at 0 minutes  BP- Standing at 0 minutes: 105/63  Pulse- Standing at 0 minutes: 126    Physical Exam 1225: Physical examination:  Nursing notes reviewed; Vital signs and O2 SAT reviewed;  Constitutional: Well developed, Well nourished, In no acute distress; Head:  Normocephalic, atraumatic; Eyes: EOMI, PERRL, No scleral icterus; ENMT: TM's clear bilat. +edemetous nasal turbinates bilat with clear rhinorrhea. Mouth and pharynx without lesions. No tonsillar exudates. No intra-oral edema. No submandibular or sublingual edema. No hoarse voice, no drooling, no stridor. No pain with manipulation of larynx. No trismus. Mouth and pharynx normal, Mucous membranes dry; Neck: Supple, no meningeal signs. Full range of motion, No lymphadenopathy; Cardiovascular: Tachycardic rate and rhythm, No gallop; Respiratory: Breath sounds clear & equal bilaterally, No wheezes.  Speaking full sentences with ease, Normal respiratory effort/excursion; Chest: Nontender, Movement normal; Abdomen: Soft,  Nontender, Nondistended, Normal bowel sounds; Genitourinary: No CVA tenderness; Spine:  No midline CS, TS, LS tenderness. +TTP R>L cervical paraspinal muscles.;; Extremities: Pulses normal, No tenderness, No edema, No calf edema or asymmetry.; Neuro: AA&Ox3, Major CN grossly intact. No facial droop. Speech clear. No gross focal motor or sensory deficits in extremities.; Skin: Color normal, Warm, Dry.   ED Treatments / Results  Labs (all labs ordered are listed, but only abnormal results are displayed)   EKG  EKG Interpretation  Date/Time:  Sunday June 18 2016 12:27:39 EST Ventricular Rate:  105 PR Interval:    QRS Duration: 104 QT Interval:  352 QTC Calculation: 466 R Axis:   83 Text Interpretation:  Sinus tachycardia Left atrial enlargement Baseline wander When compared with ECG of 06/07/2015 No significant change was found Confirmed by [facility]  MD, Nunzio Cory [phone]) on 06/18/2016 12:38:28 PM       Radiology   Procedures Procedures (including critical care time)  Medications Ordered in ED Medications - No data to display   Initial Impression / Assessment and Plan / ED Course  I have reviewed the triage vital signs and the nursing notes.  Pertinent labs & imaging results that were available during my care of the patient were reviewed by me and considered in my medical decision making (see chart for details).  MDM Reviewed: previous chart, nursing note and vitals Reviewed previous: labs and ECG Interpretation: labs, ECG, x-ray and CT scan   Results for orders placed or performed during the hospital encounter of 06/18/16  Urinalysis, Routine w reflex microscopic  Result Value Ref Range   Color, Urine YELLOW YELLOW   APPearance CLOUDY (A) CLEAR   Specific Gravity, Urine >1.030 (H) 1.005 - 1.030   pH 5.5 5.0 - 8.0   Glucose, UA NEGATIVE NEGATIVE mg/dL   Hgb urine dipstick SMALL (A) NEGATIVE   Bilirubin Urine SMALL (A) NEGATIVE   Ketones, ur TRACE (A) NEGATIVE mg/dL     Protein, ur 30 (A) NEGATIVE mg/dL   Nitrite NEGATIVE NEGATIVE   Leukocytes, UA NEGATIVE NEGATIVE  Comprehensive metabolic panel  Result Value Ref Range   Sodium 136 135 - 145 mmol/L   Potassium 3.3 (L) 3.5 - 5.1 mmol/L   Chloride 99 (L) 101 - 111 mmol/L   CO2 25 22 - 32 mmol/L   Glucose, Bld 122 (H) 65 - 99 mg/dL   BUN 23 (H) 6 - 20 mg/dL   Creatinine, Ser 1.15 (H) 0.44 - 1.00 mg/dL   Calcium 9.3 8.9 - 10.3 mg/dL   Total Protein 7.9 6.5 - 8.1 g/dL   Albumin 4.0 3.5 - 5.0 g/dL   AST 53 (H) 15 - 41 U/L   ALT 30 14 - 54 U/L   Alkaline Phosphatase 112 38 - 126 U/L   Total Bilirubin 1.0 0.3 - 1.2 mg/dL   GFR calc non Af Amer 51 (L) >60 mL/min   GFR calc Af Amer 59 (L) >60 mL/min   Anion gap 12 5 - 15  Lipase, blood  Result Value Ref Range   Lipase 20 11 - 51 U/L  Troponin I  Result Value Ref Range   Troponin I <0.03 <0.03 ng/mL  Lactic acid, plasma  Result Value Ref Range   Lactic Acid, Venous 1.8 0.5 - 1.9 mmol/L  CBC with Differential  Result Value Ref Range   WBC 8.5 4.0 - 10.5 K/uL   RBC 4.74 3.87 - 5.11 MIL/uL   Hemoglobin 13.8 12.0 - 15.0 g/dL   HCT 41.6 36.0 - 46.0 %   MCV 87.8 78.0 - 100.0 fL   MCH 29.1 26.0 - 34.0 pg   MCHC 33.2 30.0 - 36.0 g/dL   RDW 13.3 11.5 - 15.5 %   Platelets 388 150 - 400 K/uL   Neutrophils Relative % 61 %   Neutro Abs 5.2 1.7 - 7.7 K/uL   Lymphocytes Relative 30 %   Lymphs Abs 2.5 0.7 - 4.0 K/uL   Monocytes Relative 9 %   Monocytes Absolute 0.7 0.1 - 1.0 K/uL   Eosinophils Relative 0 %   Eosinophils Absolute 0.0 0.0 - 0.7 K/uL   Basophils Relative 0 %   Basophils Absolute 0.0 0.0 - 0.1 K/uL  Influenza panel by PCR (type A & B)  Result Value Ref Range   Influenza A By PCR POSITIVE (A) NEGATIVE   Influenza B By PCR NEGATIVE NEGATIVE  Urinalysis, Microscopic (reflex)  Result Value Ref Range   RBC / HPF 0-5 0 - 5 RBC/hpf   WBC, UA 6-30 0 - 5 WBC/hpf   Bacteria, UA MANY (A)  NONE SEEN   Squamous Epithelial / LPF TOO NUMEROUS TO  COUNT (A) NONE SEEN   Dg Chest 2 View Result Date: 06/18/2016 CLINICAL DATA:  Syncopal episode.  Fever and generalized weakness. EXAM: CHEST  2 VIEW COMPARISON:  Chest CT - 06/07/2015 FINDINGS: Normal cardiac silhouette and mediastinal contours. No focal parenchymal opacities. No pleural effusion or pneumothorax. No evidence of edema. No acute osseus abnormalities. IMPRESSION: No acute cardiopulmonary disease. Specifically, no evidence of pneumonia. Electronically Signed   By: Sandi Mariscal M.D.   On: 06/18/2016 13:31   Ct Head Wo Contrast Result Date: 06/18/2016 CLINICAL DATA:  Headache.  Syncope. EXAM: CT HEAD WITHOUT CONTRAST CT CERVICAL SPINE WITHOUT CONTRAST TECHNIQUE: Multidetector CT imaging of the head and cervical spine was performed following the standard protocol without intravenous contrast. Multiplanar CT image reconstructions of the cervical spine were also generated. COMPARISON:  None. FINDINGS: CT HEAD FINDINGS Brain: No evidence of acute infarction, hemorrhage, hydrocephalus, extra-axial collection or mass lesion/mass effect. Vascular: No hyperdense vessel or unexpected calcification. Skull: Normal. Negative for fracture or focal lesion. Sinuses/Orbits: No acute finding. Other: None. CT CERVICAL SPINE FINDINGS Alignment: Normal. Skull base and vertebrae: No acute fracture. No primary bone lesion or focal pathologic process. Soft tissues and spinal canal: No prevertebral fluid or swelling. No visible canal hematoma. Disc levels:  No significant degenerative changes. Upper chest: Negative. Other: No other abnormalities. IMPRESSION: 1. No acute intracranial abnormality. 2. No fracture or traumatic malalignment. Electronically Signed   By: Dorise Bullion III M.D   On: 06/18/2016 14:25   Ct Cervical Spine Wo Contrast Result Date: 06/18/2016 CLINICAL DATA:  Headache.  Syncope. EXAM: CT HEAD WITHOUT CONTRAST CT CERVICAL SPINE WITHOUT CONTRAST TECHNIQUE: Multidetector CT imaging of the head and  cervical spine was performed following the standard protocol without intravenous contrast. Multiplanar CT image reconstructions of the cervical spine were also generated. COMPARISON:  None. FINDINGS: CT HEAD FINDINGS Brain: No evidence of acute infarction, hemorrhage, hydrocephalus, extra-axial collection or mass lesion/mass effect. Vascular: No hyperdense vessel or unexpected calcification. Skull: Normal. Negative for fracture or focal lesion. Sinuses/Orbits: No acute finding. Other: None. CT CERVICAL SPINE FINDINGS Alignment: Normal. Skull base and vertebrae: No acute fracture. No primary bone lesion or focal pathologic process. Soft tissues and spinal canal: No prevertebral fluid or swelling. No visible canal hematoma. Disc levels:  No significant degenerative changes. Upper chest: Negative. Other: No other abnormalities. IMPRESSION: 1. No acute intracranial abnormality. 2. No fracture or traumatic malalignment. Electronically Signed   By: Dorise Bullion III M.D   On: 06/18/2016 14:25    1540:  Orthostatic on VS; judicious IVF bolus given. +UTI, UC pending; will dose IV rocephin. PO tamiflu given. Dx and testing d/w pt and family.  Questions answered.  Verb understanding, agreeable to observation admit. T/C to [facility]. Olevia Bowens, case discussed, including:  HPI, pertinent PM/SHx, VS/PE, dx testing, ED course and treatment:  Agreeable to admit, requests he will come to the ED for evaluation.    Final Clinical Impressions(s) / ED Diagnoses   Final diagnoses:  None    New Prescriptions New Prescriptions   No medications on file      Francine Graven, DO 06/21/16 0825

## 2016-06-18 NOTE — H&P (Signed)
History and Physical    Kristin Hall WUJ:[id number] DOB: 05-28-1955 DOA: 06/18/2016  PCP: Syliva Overman, MD   Patient coming from: Home.  Chief Complaint: LOC, fever and weakness.  HPI: Kristin Hall is a 61 y.o. female with medical history significant of hypertension, hyperlipidemia, thrombocytosis, iron deficiency, headaches who is coming to the emergency department after having a LOC earlier today around 12:15 AM and hitting her head on the wall while falling. She does not know how long she was unconscious. She denies chest pain, palpitations, diaphoresis, dyspnea prior to the fall, but may have been feeling lightheaded. She states that she had fever from Thursday to yesterday Saturday associated with occipital headache and body aches. She denies having fever today. She denies rhinorrhea, sore throat or productive cough.   She has been taking Tylenol and cough medication since Thursday, but states that her fluid and food intake has been decreased. She took an aspirin yesterday, which did not help her symptoms. Today she complains of 4 episodes of diarrhea this morning, but denies abdominal pain, nausea, emesis, melena or hematochezia. She complains of mild urgency, but denies dysuria or gross hematuria.  ED Course: Workup in the emergency department shows EKG with LAD and baseline wander, but otherwise no significant changes. WBC of 8.5, hemoglobin 13.6 g/dL and platelets of 956. Her urine analysis shows 6-30 WBC, many bacteria, specific gravity of more than 1.030, small hemoglobin and small bilirubin and trace ketones and protein 30 mg/dL. Negative leukocyte esterase and nitrites. Lactic acid was 1.8, sodium 136, potassium 3.3, chloride 99 and bicarbonate 25 mmol/L. Her lipase was 20 and AST 53 units. Her influenza A by PCR was positive. Her chest radiograph, CT of the head and CT of the cervical spine did not show any acute abnormalities.   The patient received 1000 mL of normal saline  bolus, ceftriaxone 1 g IVPB, Tamiflu 75 mg and potassium chloride 40 mEq by mouth. She mentioned that she was feeling better with the treatment.  Review of Systems: As per HPI otherwise 10 point review of systems negative.    Past Medical History:  Diagnosis Date  . Elevated alkaline phosphatase level   . Headache   . Hyperlipidemia   . Hypertension   . Thrombocytosis (HCC) 07/23/2015    Past Surgical History:  Procedure Laterality Date  . ABDOMINAL HYSTERECTOMY     partial   . COLONOSCOPY  10/04/2006   SLF: Normal retroflexed view of the rectum/Normal colon without evidence of polyps, masses, inflammatory changes, diverticula or arteriovenous malformations     reports that she has never smoked. She has never used smokeless tobacco. She reports that she does not drink alcohol or use drugs.  Allergies  Allergen Reactions  . Amlodipine Other (See Comments)    Chest pain  . Aspirin Other (See Comments)    Generalized aches  . Metronidazole Hives and Itching  . Valsartan Cough    Family History  Problem Relation Age of Onset  . Cancer Mother     Breast  . Dementia Mother   . Cancer Father     Lung Cancer, deceased at 6  . Colon cancer Neg Hx   . Liver disease Neg Hx     Prior to Admission medications   Medication Sig Start Date End Date Taking? Authorizing Provider  acetaminophen (TYLENOL) 500 MG tablet Take 1 tablet (500 mg total) by mouth every 8 (eight) hours as needed. 03/02/16  Yes Kerri Perches, MD  cyclobenzaprine (  FLEXERIL) 5 MG tablet One tablet at bedtime as needed, for neck spasm 03/02/16  Yes Kerri Perches, MD  diltiazem (TIAZAC) 300 MG 24 hr capsule TAKE 1 CAPSULE BY MOUTH ONCE DAILY 04/11/16  Yes Kerri Perches, MD  Multiple Vitamins-Minerals (ALIVE WOMENS 50+) TABS Take 1 tablet by mouth daily.   Yes Historical Provider, MD  pantoprazole (PROTONIX) 40 MG tablet TAKE 1 TABLET BY MOUTH DAILY. 12/28/15  Yes Kerri Perches, MD  POLY-IRON 150  150 MG capsule TAKE 1 CAPSULE (150 MG TOTAL) BY MOUTH DAILY. 03/16/16  Yes Allene Pyo, MD  potassium chloride SA (K-DUR,KLOR-CON) 20 MEQ tablet Take 2 tablets (40 mEq total) by mouth daily. 06/24/15  Yes Laqueta Linden, MD  valsartan-hydrochlorothiazide (DIOVAN-HCT) 320-25 MG tablet TAKE 1 TABLET BY MOUTH ONCE DAILY 01/14/16  Yes Kerri Perches, MD  VYTORIN 10-10 MG per tablet TAKE 1 TABLET BY MOUTH AT BEDTIME FOR CHOLESTEROL. 08/04/14  Yes Kerri Perches, MD    Physical Exam:  Constitutional: NAD, calm, comfortable Vitals:   06/18/16 1400 06/18/16 1430 06/18/16 1445 06/18/16 1530  BP: 124/79 116/76  127/67  Pulse: 92 87  85  Resp: 10  20 18   Temp:      TempSrc:      SpO2: 97% 97%  98%  Weight:      Height:       Eyes: PERRL, lids and conjunctivae normal ENMT: Mucous membranes and lips are mildly dry. Posterior pharynx clear of any exudate or lesions. Neck: normal, supple, no masses, no thyromegaly Respiratory: clear to auscultation bilaterally, no wheezing, no crackles. Normal respiratory effort. No accessory muscle use.  Cardiovascular: Regular rate and rhythm, no murmurs / rubs / gallops. No extremity edema. 2+ pedal pulses. No carotid bruits.  Abdomen: Soft, no tenderness, no masses palpated. No hepatosplenomegaly. Bowel sounds positive.  Musculoskeletal: no clubbing / cyanosis.Good ROM, no contractures. Normal muscle tone.  Skin: no rashes, lesions, ulcers on the skin exam. Neurologic: CN 2-12 grossly intact. Sensation intact, DTR normal. Strength 5/5 in all 4.  Psychiatric: Normal judgment and insight. Alert and oriented x 4. Normal mood.    Labs on Admission: I have personally reviewed following labs and imaging studies  CBC:  Recent Labs Lab 06/18/16 1239  WBC 8.5  NEUTROABS 5.2  HGB 13.8  HCT 41.6  MCV 87.8  PLT 388   Basic Metabolic Panel:  Recent Labs Lab 06/18/16 1239  NA 136  K 3.3*  CL 99*  CO2 25  GLUCOSE 122*  BUN 23*  CREATININE  1.15*  CALCIUM 9.3   GFR: Estimated Creatinine Clearance: 53.3 mL/min (by C-G formula based on SCr of 1.15 mg/dL (H)). Liver Function Tests:  Recent Labs Lab 06/18/16 1239  AST 53*  ALT 30  ALKPHOS 112  BILITOT 1.0  PROT 7.9  ALBUMIN 4.0    Recent Labs Lab 06/18/16 1239  LIPASE 20   No results for input(s): AMMONIA in the last 168 hours. Coagulation Profile: No results for input(s): INR, PROTIME in the last 168 hours. Cardiac Enzymes:  Recent Labs Lab 06/18/16 1239  TROPONINI <0.03   BNP (last 3 results) No results for input(s): PROBNP in the last 8760 hours. HbA1C: No results for input(s): HGBA1C in the last 72 hours. CBG: No results for input(s): GLUCAP in the last 168 hours. Lipid Profile: No results for input(s): CHOL, HDL, LDLCALC, TRIG, CHOLHDL, LDLDIRECT in the last 72 hours. Thyroid Function Tests: No results for input(s): TSH,  T4TOTAL, FREET4, T3FREE, THYROIDAB in the last 72 hours. Anemia Panel: No results for input(s): VITAMINB12, FOLATE, FERRITIN, TIBC, IRON, RETICCTPCT in the last 72 hours. Urine analysis:    Component Value Date/Time   COLORURINE YELLOW 06/18/2016 1215   APPEARANCEUR CLOUDY (A) 06/18/2016 1215   LABSPEC >1.030 (H) 06/18/2016 1215   PHURINE 5.5 06/18/2016 1215   GLUCOSEU NEGATIVE 06/18/2016 1215   HGBUR SMALL (A) 06/18/2016 1215   BILIRUBINUR SMALL (A) 06/18/2016 1215   BILIRUBINUR neg 03/03/2016 0839   KETONESUR TRACE (A) 06/18/2016 1215   PROTEINUR 30 (A) 06/18/2016 1215   UROBILINOGEN 0.2 03/03/2016 0839   UROBILINOGEN 0.2 02/25/2014 1707   NITRITE NEGATIVE 06/18/2016 1215   LEUKOCYTESUR NEGATIVE 06/18/2016 1215    Radiological Exams on Admission: Dg Chest 2 View  Result Date: 06/18/2016 CLINICAL DATA:  Syncopal episode.  Fever and generalized weakness. EXAM: CHEST  2 VIEW COMPARISON:  Chest CT - 06/07/2015 FINDINGS: Normal cardiac silhouette and mediastinal contours. No focal parenchymal opacities. No pleural effusion  or pneumothorax. No evidence of edema. No acute osseus abnormalities. IMPRESSION: No acute cardiopulmonary disease. Specifically, no evidence of pneumonia. Electronically Signed   By: Simonne Come M.D.   On: 06/18/2016 13:31   Ct Head Wo Contrast  Result Date: 06/18/2016 CLINICAL DATA:  Headache.  Syncope. EXAM: CT HEAD WITHOUT CONTRAST CT CERVICAL SPINE WITHOUT CONTRAST TECHNIQUE: Multidetector CT imaging of the head and cervical spine was performed following the standard protocol without intravenous contrast. Multiplanar CT image reconstructions of the cervical spine were also generated. COMPARISON:  None. FINDINGS: CT HEAD FINDINGS Brain: No evidence of acute infarction, hemorrhage, hydrocephalus, extra-axial collection or mass lesion/mass effect. Vascular: No hyperdense vessel or unexpected calcification. Skull: Normal. Negative for fracture or focal lesion. Sinuses/Orbits: No acute finding. Other: None. CT CERVICAL SPINE FINDINGS Alignment: Normal. Skull base and vertebrae: No acute fracture. No primary bone lesion or focal pathologic process. Soft tissues and spinal canal: No prevertebral fluid or swelling. No visible canal hematoma. Disc levels:  No significant degenerative changes. Upper chest: Negative. Other: No other abnormalities. IMPRESSION: 1. No acute intracranial abnormality. 2. No fracture or traumatic malalignment. Electronically Signed   By: Gerome Sam III M.D   On: 06/18/2016 14:25   Ct Cervical Spine Wo Contrast  Result Date: 06/18/2016 CLINICAL DATA:  Headache.  Syncope. EXAM: CT HEAD WITHOUT CONTRAST CT CERVICAL SPINE WITHOUT CONTRAST TECHNIQUE: Multidetector CT imaging of the head and cervical spine was performed following the standard protocol without intravenous contrast. Multiplanar CT image reconstructions of the cervical spine were also generated. COMPARISON:  None. FINDINGS: CT HEAD FINDINGS Brain: No evidence of acute infarction, hemorrhage, hydrocephalus, extra-axial  collection or mass lesion/mass effect. Vascular: No hyperdense vessel or unexpected calcification. Skull: Normal. Negative for fracture or focal lesion. Sinuses/Orbits: No acute finding. Other: None. CT CERVICAL SPINE FINDINGS Alignment: Normal. Skull base and vertebrae: No acute fracture. No primary bone lesion or focal pathologic process. Soft tissues and spinal canal: No prevertebral fluid or swelling. No visible canal hematoma. Disc levels:  No significant degenerative changes. Upper chest: Negative. Other: No other abnormalities. IMPRESSION: 1. No acute intracranial abnormality. 2. No fracture or traumatic malalignment. Electronically Signed   By: Gerome Sam III M.D   On: 06/18/2016 14:25    EKG: Independently reviewed. Vent. rate 105 BPM PR interval * ms QRS duration 104 ms QT/QTc 352/466 ms P-R-T axes 77 83 48 Sinus tachycardia Left atrial enlargement Baseline wander  Assessment/Plan Principal Problem:   Syncope Admit  to observation/telemetry. Continue IV hydration and electrolyte replacement. Trend troponin levels. CT scan of the brain and cervical spine did not show any acute abnormalities. Check echocardiogram in a.m. Check carotid Doppler in a.m.  Active Problems:   Influenza A Droplet precautions. Continue Tamiflu 75 mg by mouth twice a day. Analgesics, antiemetics and cough suppressants as needed.    Hyperlipemia Hold Vytorin tonight due to body aches and mildly increased AST level. Recheck LFTs in AM.    Essential hypertension Continue Cardizem 300 mg orally every 24 hours. Hold valsartan and hydrochlorothiazide due to increase in BUN and creatinine. Continue gentle IV hydration and monitor blood pressure.    GERD Continue pantoprazole 40 mg by mouth daily    UTI (urinary tract infection) Continue Rocephin 1 g IVPB every 24 hours. Follow-up urine culture and sensitivity.    Hypokalemia Replacing. Check magnesium level. Follow-up potassium level in  the morning.     DVT prophylaxis: Lovenox SQ. Code Status: Full code. Family Communication:  Disposition Plan: Admit for syncope work up, Influenza A and UTI treatment. Consults called:  Admission status: Observation/Telemetry.   Bobette Mo MD [facility] Pager [phone]  If 7PM-7AM, please contact night-coverage [email or web address] Password [email or web address]  06/18/2016, 3:51 PM

## 2016-06-18 NOTE — Progress Notes (Signed)
PHARMACY NOTE:  ANTIMICROBIAL RENAL DOSAGE ADJUSTMENT  Current antimicrobial regimen includes a mismatch between antimicrobial dosage and estimated renal function.  As per policy approved by the Pharmacy & Therapeutics and Medical Executive Committees, the antimicrobial dosage will be adjusted accordingly.  Current antimicrobial dosage: tamiflu 75 mg po BID   Indication: influenza  Renal Function:  Estimated Creatinine Clearance: 53.3 mL/min (by C-G formula based on SCr of 1.15 mg/dL (H)). []      On intermittent HD, scheduled: []      On CRRT    Antimicrobial dosage has been changed to:  30 mg po BID  Additional comments:   Thank you for allowing pharmacy to be a part of this patient's care.  Vonda Antigua, [facility] 06/18/2016 5:08 PM

## 2016-06-18 NOTE — ED Triage Notes (Signed)
Patient reports period of LOC at 1215 am. Patient unsure of length of LOC. Patient reports hitting head on wall with syncope. Patient states had fever and generalized weakness on Thursday, headache yesterday, and diarrhea this morning. Per patient has been resting in bed x3 days and taking tylenol and robitussin. Patient also reports taking aspirin for headache yesterday with no relief. Per patient not eating or drinking well x3 days. Denies any nausea, vomiting, urinary symptoms, slurred speech, confusion but does report dizziness.

## 2016-06-19 ENCOUNTER — Observation Stay (HOSPITAL_COMMUNITY): Payer: 59

## 2016-06-19 DIAGNOSIS — J101 Influenza due to other identified influenza virus with other respiratory manifestations: Secondary | ICD-10-CM | POA: Diagnosis not present

## 2016-06-19 DIAGNOSIS — J09X2 Influenza due to identified novel influenza A virus with other respiratory manifestations: Secondary | ICD-10-CM | POA: Diagnosis not present

## 2016-06-19 DIAGNOSIS — K219 Gastro-esophageal reflux disease without esophagitis: Secondary | ICD-10-CM

## 2016-06-19 DIAGNOSIS — Z79899 Other long term (current) drug therapy: Secondary | ICD-10-CM | POA: Diagnosis not present

## 2016-06-19 DIAGNOSIS — I6523 Occlusion and stenosis of bilateral carotid arteries: Secondary | ICD-10-CM | POA: Diagnosis not present

## 2016-06-19 DIAGNOSIS — I10 Essential (primary) hypertension: Secondary | ICD-10-CM | POA: Diagnosis not present

## 2016-06-19 DIAGNOSIS — R55 Syncope and collapse: Secondary | ICD-10-CM

## 2016-06-19 DIAGNOSIS — N390 Urinary tract infection, site not specified: Secondary | ICD-10-CM | POA: Diagnosis not present

## 2016-06-19 DIAGNOSIS — E860 Dehydration: Secondary | ICD-10-CM | POA: Diagnosis not present

## 2016-06-19 DIAGNOSIS — E876 Hypokalemia: Secondary | ICD-10-CM | POA: Diagnosis not present

## 2016-06-19 DIAGNOSIS — I951 Orthostatic hypotension: Secondary | ICD-10-CM | POA: Diagnosis not present

## 2016-06-19 LAB — COMPREHENSIVE METABOLIC PANEL
ALBUMIN: 3 g/dL — AB (ref 3.5–5.0)
ALK PHOS: 80 U/L (ref 38–126)
ALT: 19 U/L (ref 14–54)
AST: 32 U/L (ref 15–41)
Anion gap: 7 (ref 5–15)
BUN: 16 mg/dL (ref 6–20)
CALCIUM: 8.1 mg/dL — AB (ref 8.9–10.3)
CO2: 24 mmol/L (ref 22–32)
CREATININE: 0.78 mg/dL (ref 0.44–1.00)
Chloride: 106 mmol/L (ref 101–111)
GFR calc Af Amer: >60 mL/min (ref >60)
GFR calc non Af Amer: >60 mL/min (ref >60)
GLUCOSE: 77 mg/dL (ref 65–99)
Potassium: 4.1 mmol/L (ref 3.5–5.1)
SODIUM: 137 mmol/L (ref 135–145)
Total Bilirubin: 0.6 mg/dL (ref 0.3–1.2)
Total Protein: 6 g/dL — ABNORMAL LOW (ref 6.5–8.1)

## 2016-06-19 LAB — CBC
HCT: 35.2 % — ABNORMAL LOW (ref 36.0–46.0)
Hemoglobin: 11.4 g/dL — ABNORMAL LOW (ref 12.0–15.0)
MCH: 28.9 pg (ref 26.0–34.0)
MCHC: 32.4 g/dL (ref 30.0–36.0)
MCV: 89.3 fL (ref 78.0–100.0)
PLATELETS: 333 10*3/uL (ref 150–400)
RBC: 3.94 MIL/uL (ref 3.87–5.11)
RDW: 13.5 % (ref 11.5–15.5)
WBC: 5.1 10*3/uL (ref 4.0–10.5)

## 2016-06-19 LAB — URINE CULTURE

## 2016-06-19 MED ORDER — OSELTAMIVIR PHOSPHATE 75 MG PO CAPS: 75.0000 mg | ORAL_CAPSULE | Freq: Two times a day (BID) | ORAL | 0 refills | Status: AC

## 2016-06-19 MED ORDER — CEFPODOXIME PROXETIL 200 MG PO TABS: 200.0000 mg | ORAL_TABLET | Freq: Two times a day (BID) | ORAL | 0 refills | Status: AC

## 2016-06-19 MED FILL — OSELTAMIVIR PHOS 75 MG CAP: 75 | 4 days supply | Qty: 8 | Fill #0

## 2016-06-19 MED FILL — CEFPODOXIME 200 MG TABLET: 200 | 5 days supply | Qty: 10 | Fill #0

## 2016-06-19 NOTE — Discharge Instructions (Signed)
Dehydration, Adult Dehydration is a condition in which there is not enough fluid or water in the body. This happens when you lose more fluids than you take in. Important organs, such as the kidneys, brain, and heart, cannot function without a proper amount of fluids. Any loss of fluids from the body can lead to dehydration. Dehydration can range from mild to severe. This condition should be treated right away to prevent it from becoming severe. What are the causes? This condition may be caused by:  Vomiting.  Diarrhea.  Excessive sweating, such as from heat exposure or exercise.  Not drinking enough fluid, especially:  When ill.  While doing activity that requires a lot of energy.  Excessive urination.  Fever.  Infection.  Certain medicines, such as medicines that cause the body to lose excess fluid (diuretics).  Inability to access safe drinking water.  Reduced physical ability to get adequate water and food. What increases the risk? This condition is more likely to develop in people:  Who have a poorly controlled long-term (chronic) illness, such as diabetes, heart disease, or kidney disease.  Who are age 67 or older.  Who are disabled.  Who live in a place with high altitude.  Who play endurance sports. What are the signs or symptoms? Symptoms of mild dehydration may include:  Thirst.  Dry lips.  Slightly dry mouth.  Dry, warm skin.  Dizziness. Symptoms of moderate dehydration may include:  Very dry mouth.  Muscle cramps.  Dark urine. Urine may be the color of tea.  Decreased urine production.  Decreased tear production.  Heartbeat that is irregular or faster than normal (palpitations).  Headache.  Light-headedness, especially when you stand up from a sitting position.  Fainting (syncope). Symptoms of severe dehydration may include:  Changes in skin, such as:  Cold and clammy skin.  Blotchy (mottled) or pale skin.  Skin that does not  quickly return to normal after being lightly pinched and released (poor skin turgor).  Changes in body fluids, such as:  Extreme thirst.  No tear production.  Inability to sweat when body temperature is high, such as in hot weather.  Very little urine production.  Changes in vital signs, such as:  Weak pulse.  Pulse that is more than 100 beats a minute when sitting still.  Rapid breathing.  Low blood pressure.  Other changes, such as:  Sunken eyes.  Cold hands and feet.  Confusion.  Lack of energy (lethargy).  Difficulty waking up from sleep.  Short-term weight loss.  Unconsciousness. How is this diagnosed? This condition is diagnosed based on your symptoms and a physical exam. Blood and urine tests may be done to help confirm the diagnosis. How is this treated? Treatment for this condition depends on the severity. Mild or moderate dehydration can often be treated at home. Treatment should be started right away. Do not wait until dehydration becomes severe. Severe dehydration is an emergency and it needs to be treated in a hospital. Treatment for mild dehydration may include:  Drinking more fluids.  Replacing salts and minerals in your blood (electrolytes) that you may have lost. Treatment for moderate dehydration may include:  Drinking an oral rehydration solution (ORS). This is a drink that helps you replace fluids and electrolytes (rehydrate). It can be found at pharmacies and retail stores. Treatment for severe dehydration may include:  Receiving fluids through an IV tube.  Receiving an electrolyte solution through a feeding tube that is passed through your nose and into  your stomach (nasogastric tube, or NG tube).  Correcting any abnormalities in electrolytes.  Treating the underlying cause of dehydration. Follow these instructions at home:  If directed by your health care provider, drink an ORS:  Make an ORS by following instructions on the  package.  Start by drinking small amounts, about  cup (120 mL) every 5-10 minutes.  Slowly increase how much you drink until you have taken the amount recommended by your health care provider.  Drink enough clear fluid to keep your urine clear or pale yellow. If you were told to drink an ORS, finish the ORS first, then start slowly drinking other clear fluids. Drink fluids such as:  Water. Do not drink only water. Doing that can lead to having too little salt (sodium) in the body (hyponatremia).  Ice chips.  Fruit juice that you have added water to (diluted fruit juice).  Low-calorie sports drinks.  Avoid:  Alcohol.  Drinks that contain a lot of sugar. These include high-calorie sports drinks, fruit juice that is not diluted, and soda.  Caffeine.  Foods that are greasy or contain a lot of fat or sugar.  Take over-the-counter and prescription medicines only as told by your health care provider.  Do not take sodium tablets. This can lead to having too much sodium in the body (hypernatremia).  Eat foods that contain a healthy balance of electrolytes, such as bananas, oranges, potatoes, tomatoes, and spinach.  Keep all follow-up visits as told by your health care provider. This is important. Contact a health care provider if:  You have abdominal pain that:  Gets worse.  Stays in one area (localizes).  You have a rash.  You have a stiff neck.  You are more irritable than usual.  You are sleepier or more difficult to wake up than usual.  You feel weak or dizzy.  You feel very thirsty.  You have urinated only a small amount of very dark urine over 6-8 hours. Get help right away if:  You have symptoms of severe dehydration.  You cannot drink fluids without vomiting.  Your symptoms get worse with treatment.  You have a fever.  You have a severe headache.  You have vomiting or diarrhea that:  Gets worse.  Does not go away.  You have blood or green matter  (bile) in your vomit.  You have blood in your stool. This may cause stool to look black and tarry.  You have not urinated in 6-8 hours.  You faint.  Your heart rate while sitting still is over 100 beats a minute.  You have trouble breathing. This information is not intended to replace advice given to you by your health care provider. Make sure you discuss any questions you have with your health care provider. Document Released: 05/01/2005 Document Revised: 11/26/2015 Document Reviewed: 06/25/2015 Elsevier Interactive Patient Education  2017 [place].   Influenza, Adult Influenza, more commonly known as the flu, is a viral infection that primarily affects the respiratory tract. The respiratory tract includes organs that help you breathe, such as the lungs, nose, and throat. The flu causes many common cold symptoms, as well as a high fever and body aches. The flu spreads easily from person to person (is contagious). Getting a flu shot (influenza vaccination) every year is the best way to prevent influenza. What are the causes? Influenza is caused by a virus. You can catch the virus by:  Breathing in droplets from an infected person's cough or sneeze.  Touching  something that was recently contaminated with the virus and then touching your mouth, nose, or eyes. What increases the risk? The following factors may make you more likely to get the flu:  Not cleaning your hands frequently with soap and water or alcohol-based hand sanitizer.  Having close contact with many people during cold and flu season.  Touching your mouth, eyes, or nose without washing or sanitizing your hands first.  Not drinking enough fluids or not eating a healthy diet.  Not getting enough sleep or exercise.  Being under a high amount of stress.  Not getting a yearly (annual) flu shot. You may be at a higher risk of complications from the flu, such as a severe lung infection (pneumonia), if you:  Are  over the age of 67.  Are pregnant.  Have a weakened disease-fighting system (immune system). You may have a weakened immune system if you:  Have HIV or AIDS.  Are undergoing chemotherapy.  Aretaking medicines that reduce the activity of (suppress) the immune system.  Have a long-term (chronic) illness, such as heart disease, kidney disease, diabetes, or lung disease.  Have a liver disorder.  Are obese.  Have anemia. What are the signs or symptoms? Symptoms of this condition typically last 4-10 days and may include:  Fever.  Chills.  Headache, body aches, or muscle aches.  Sore throat.  Cough.  Runny or congested nose.  Chest discomfort and cough.  Poor appetite.  Weakness or tiredness (fatigue).  Dizziness.  Nausea or vomiting. How is this diagnosed? This condition may be diagnosed based on your medical history and a physical exam. Your health care provider may do a nose or throat swab test to confirm the diagnosis. How is this treated? If influenza is detected early, you can be treated with antiviral medicine that can reduce the length of your illness and the severity of your symptoms. This medicine may be given by mouth (orally) or through an IV tube that is inserted in one of your veins. The goal of treatment is to relieve symptoms by taking care of yourself at home. This may include taking over-the-counter medicines, drinking plenty of fluids, and adding humidity to the air in your home. In some cases, influenza goes away on its own. Severe influenza or complications from influenza may be treated in a hospital. Follow these instructions at home:  Take over-the-counter and prescription medicines only as told by your health care provider.  Use a cool mist humidifier to add humidity to the air in your home. This can make breathing easier.  Rest as needed.  Drink enough fluid to keep your urine clear or pale yellow.  Cover your mouth and nose when you cough  or sneeze.  Wash your hands with soap and water often, especially after you cough or sneeze. If soap and water are not available, use hand sanitizer.  Stay home from work or school as told by your health care provider. Unless you are visiting your health care provider, try to avoid leaving home until your fever has been gone for 24 hours without the use of medicine.  Keep all follow-up visits as told by your health care provider. This is important. How is this prevented?  Getting an annual flu shot is the best way to avoid getting the flu. You may get the flu shot in late summer, fall, or winter. Ask your health care provider when you should get your flu shot.  Wash your hands often or use hand sanitizer  often.  Avoid contact with people who are sick during cold and flu season.  Eat a healthy diet, drink plenty of fluids, get enough sleep, and exercise regularly. Contact a health care provider if:  You develop new symptoms.  You have:  Chest pain.  Diarrhea.  A fever.  Your cough gets worse.  You produce more mucus.  You feel nauseous or you vomit. Get help right away if:  You develop shortness of breath or difficulty breathing.  Your skin or nails turn a bluish color.  You have severe pain or stiffness in your neck.  You develop a sudden headache or sudden pain in your face or ear.  You cannot stop vomiting. This information is not intended to replace advice given to you by your health care provider. Make sure you discuss any questions you have with your health care provider. Document Released: 04/28/2000 Document Revised: 10/07/2015 Document Reviewed: 02/23/2015 Elsevier Interactive Patient Education  2017 [place].   Influenza, Adult Influenza (the flu") is an infection in the lungs, nose, and throat (respiratory tract). It is caused by a virus. The flu causes many common cold symptoms, as well as a high fever and body aches. It can make you feel very  sick. The flu spreads easily from person to person (is contagious). Getting a flu shot (influenza vaccination) every year is the best way to prevent the flu. Follow these instructions at home:  Take over-the-counter and prescription medicines only as told by your doctor.  Use a cool mist humidifier to add moisture (humidity) to the air in your home. This can make it easier to breathe.  Rest as needed.  Drink enough fluid to keep your pee (urine) clear or pale yellow.  Cover your mouth and nose when you cough or sneeze.  Wash your hands with soap and water often, especially after you cough or sneeze. If you cannot use soap and water, use hand sanitizer.  Stay home from work or school as told by your doctor. Unless you are visiting your doctor, try to avoid leaving home until your fever has been gone for 24 hours without the use of medicine.  Keep all follow-up visits as told by your doctor. This is important. How is this prevented?  Getting a yearly (annual) flu shot is the best way to avoid getting the flu. You may get the flu shot in late summer, fall, or winter. Ask your doctor when you should get your flu shot.  Wash your hands often or use hand sanitizer often.  Avoid contact with people who are sick during cold and flu season.  Eat healthy foods.  Drink plenty of fluids.  Get enough sleep.  Exercise regularly. Contact a doctor if:  You get new symptoms.  You have:  Chest pain.  Watery poop (diarrhea).  A fever.  Your cough gets worse.  You start to have more mucus.  You feel sick to your stomach (nauseous).  You throw up (vomit). Get help right away if:  You start to be short of breath or have trouble breathing.  Your skin or nails turn a bluish color.  You have very bad pain or stiffness in your neck.  You get a sudden headache.  You get sudden pain in your face or ear.  You cannot stop throwing up. This information is not intended to replace  advice given to you by your health care provider. Make sure you discuss any questions you have with your health care provider.  Document Released: 02/08/2008 Document Revised: 10/07/2015 Document Reviewed: 02/23/2015 Elsevier Interactive Patient Education  2017 [employer]   Near-Syncope Introduction Near-syncope is when you suddenly get weak or dizzy, or you feel like you might pass out (faint). During an episode of near-syncope, you may:  Feel dizzy or light-headed.  Feel sick to your stomach (nauseous).  See all white or all black.  Have cold, clammy skin. If you passed out, get help right away.Call your local emergency services (911 in the [place] Do not drive yourself to the hospital. Follow these instructions at home: Pay attention to any changes in your symptoms. Take these actions to help with your condition:  Have someone stay with you until you feel stable.  Do not drive, use machinery, or play sports until your doctor says it is okay.  Keep all follow-up visits as told by your doctor. This is important.  If you start to feel like you might pass out, lie down right away and raise (elevate) your feet above the level of your heart. Breathe deeply and steadily. Wait until all of the symptoms are gone.  Drink enough fluid to keep your pee (urine) clear or pale yellow.  If you are taking blood pressure or heart medicine, get up slowly and spend many minutes getting ready to sit and then stand. This can help with dizziness.  Take over-the-counter and prescription medicines only as told by your doctor. Get help right away if:  You have a very bad headache.  You have unusual pain in your chest, tummy, or back.  You are bleeding from your mouth or rectum.  You have black or tarry poop (stool).  You have a very fast or uneven heartbeat (palpitations).  You pass out one time or more than once.  You have jerky movements that you cannot control (seizure).  You are  confused.  You have trouble walking.  You are very weak.  You have vision problems. These symptoms may be an emergency. Do not wait to see if the symptoms will go away. Get medical help right away. Call your local emergency services (911 in the [place] Do not drive yourself to the hospital.  This information is not intended to replace advice given to you by your health care provider. Make sure you discuss any questions you have with your health care provider. Document Released: 10/18/2007 Document Revised: 10/07/2015 Document Reviewed: 01/13/2015  2017 Elsevier   Syncope Introduction Syncope is when you lose temporarily pass out (faint). Signs that you may be about to pass out include:  Feeling dizzy or light-headed.  Feeling sick to your stomach (nauseous).  Seeing all white or all black.  Having cold, clammy skin. If you passed out, get help right away. Call your local emergency services (911 in the [place] Do not drive yourself to the hospital. Follow these instructions at home: Pay attention to any changes in your symptoms. Take these actions to help with your condition:  Have someone stay with you until you feel stable.  Do not drive, use machinery, or play sports until your doctor says it is okay.  Keep all follow-up visits as told by your doctor. This is important.  If you start to feel like you might pass out, lie down right away and raise (elevate) your feet above the level of your heart. Breathe deeply and steadily. Wait until all of the symptoms are gone.  Drink enough fluid to keep your pee (urine) clear or pale yellow.  If you  are taking blood pressure or heart medicine, get up slowly and spend many minutes getting ready to sit and then stand. This can help with dizziness.  Take over-the-counter and prescription medicines only as told by your doctor. Get help right away if:  You have a very bad headache.  You have unusual pain in your chest, tummy, or  back.  You are bleeding from your mouth or rectum.  You have black or tarry poop (stool).  You have a very fast or uneven heartbeat (palpitations).  It hurts to breathe.  You pass out once or more than once.  You have jerky movements that you cannot control (seizure).  You are confused.  You have trouble walking.  You are very weak.  You have vision problems. These symptoms may be an emergency. Do not wait to see if the symptoms will go away. Get medical help right away. Call your local emergency services (911 in the [place] Do not drive yourself to the hospital.  This information is not intended to replace advice given to you by your health care provider. Make sure you discuss any questions you have with your health care provider. Document Released: 10/18/2007 Document Revised: 10/07/2015 Document Reviewed: 01/13/2015  2017 Elsevier   Urinary Tract Infection, Adult Introduction A urinary tract infection (UTI) is an infection of any part of the urinary tract. The urinary tract includes the:  Kidneys.  Ureters.  Bladder.  Urethra. These organs make, store, and get rid of pee (urine) in the body. Follow these instructions at home:  Take over-the-counter and prescription medicines only as told by your doctor.  If you were prescribed an antibiotic medicine, take it as told by your doctor. Do not stop taking the antibiotic even if you start to feel better.  Avoid the following drinks:  Alcohol.  Caffeine.  Tea.  Carbonated drinks.  Drink enough fluid to keep your pee clear or pale yellow.  Keep all follow-up visits as told by your doctor. This is important.  Make sure to:  Empty your bladder often and completely. Do not to hold pee for long periods of time.  Empty your bladder before and after sex.  Wipe from front to back after a bowel movement if you are female. Use each tissue one time when you wipe. Contact a doctor if:  You have back pain.  You  have a fever.  You feel sick to your stomach (nauseous).  You throw up (vomit).  Your symptoms do not get better after 3 days.  Your symptoms go away and then come back. Get help right away if:  You have very bad back pain.  You have very bad lower belly (abdominal) pain.  You are throwing up and cannot keep down any medicines or water. This information is not intended to replace advice given to you by your health care provider. Make sure you discuss any questions you have with your health care provider. Document Released: 10/18/2007 Document Revised: 10/07/2015 Document Reviewed: 03/22/2015  2017 Elsevier   Vasovagal Syncope, Adult Syncope, which is commonly known as fainting or passing out, is a temporary loss of consciousness. It occurs when the blood flow to the brain is reduced. Vasovagal syncope, also called neurocardiogenic syncope, is a fainting spell that happens when blood flow to the brain is reduced because of a sudden drop in heart rate and blood pressure. Vasovagal syncope is usually harmless. However, you can get injured if you fall during a fainting spell. What are the  causes? This condition is caused by a drop in heart rate and blood pressure, usually in response to a trigger. Many things and situations can trigger an episode, including:  Pain.  Fear.  The sight of blood. This may occur during medical procedures, such as when blood is being drawn from a vein.  Common activities, such as coughing, swallowing, stretching, or going to the bathroom.  Emotional stress.  Being in a confined space.  Prolonged standing, especially in a warm environment.  Lack of sleep or rest.  Not eating for a long time.  Not drinking enough liquids.  Recent illness.  Drinking alcohol.  Taking drugs that affect blood pressure, such as marijuana, cocaine, opiates, or inhalants. What are the signs or symptoms? Before a fainting episode, you may:  Feel dizzy or  light-headed.  Become pale.  Sense that you are going to faint.  Feel like the room is spinning.  Only see directly ahead (tunnel vision).  Feel sick to your stomach (nauseous).  See spots.  Slowly lose vision.  Hear ringing in your ears.  Have a headache.  Feel warm and sweaty.  Feel a sensation of pins and needles. During the fainting spell, you may twitch or make jerky movements. Fainting spells usually last no longer than a few minutes before you wake up. If you get up too quickly before your body can recover, you may faint again. How is this diagnosed? This condition is diagnosed based on your symptoms, your medical history, and a physical exam. Tests may be done to rule out other causes of fainting. Tests may include:  Blood tests.  Heart tests, such as an electrocardiogram (ECG), echocardiogram, or electrophysiology study.  A test to check your response to changes in position (tilt table test). How is this treated? Usually, treatment is not needed for this condition. Your health care provider may suggest ways to help prevent fainting episodes. These may include:  Drinking additional fluids if you are exposed to a trigger.  Sitting or lying down if you notice signs that an episode is coming. If your fainting spells continue, your health care provider may recommend that you:  Take medicines to prevent fainting or to help reduce further episodes of fainting.  Do certain exercises.  Wear compression stockings.  Have surgery to place a pacemaker in your body (rare). Follow these instructions at home:  Learn to identify the signs that an episode is coming.  Sit or lie down at the first sign of a fainting spell. If you sit down, put your head down between your legs. If you lie down, swing your legs up in the air to increase blood flow to the brain.  Avoid hot tubs and saunas.  Avoid standing for a long time. If you have to stand for a long time, try:  Crossing  your legs.  Flexing and stretching your leg muscles.  Squatting.  Moving your legs.  Bending over.  Drink enough fluid to keep your urine clear or pale yellow.  Make changes to your diet that your health care provider recommends. You may be told to:  Avoid caffeine.  Eat more salt.  Take over-the-counter and prescription medicines only as told by your health care provider. Contact a health care provider if:  You continue to have fainting spells despite treatment.  You faint more often despite treatment.  You lose consciousness for more than a few minutes.  You faint during or after exercising or after being startled.  You have  twitching or jerky movements for longer than a few seconds during a fainting spell.  You have an episode of twitching or jerky movements without fainting. Get help right away if:  A fainting spell leads to an injury or bleeding.  You have new symptoms that occur with the fainting spells, such as:  Shortness of breath.  Chest pain.  Irregular heartbeat.  You twitch or make jerky movements for more than 5 minutes.  You twitch or make jerky movements during more than one fainting spell. This information is not intended to replace advice given to you by your health care provider. Make sure you discuss any questions you have with your health care provider. Document Released: 04/17/2012 Document Revised: 10/13/2015 Document Reviewed: 02/27/2015 Elsevier Interactive Patient Education  2017 [employer].

## 2016-06-19 NOTE — Discharge Summary (Signed)
Physician Discharge Summary  [name]:[id number] DOB: 02/15/1956 DOA: 06/18/2016  PCP: Syliva Overman, MD  Admit date: 06/18/2016 Discharge date: 06/19/2016  Admitted From: Home  Disposition:  Home   Recommendations for Outpatient Follow-up:  1. Follow up with PCP in 1 weeks 2. Please obtain BMP/CBC in one week 3. Please order outpatient 2D echocardiogram 4. Please follow up on final urine culture and sensitivity results  Discharge Condition: STABLE  CODE STATUS: FULL  Diet recommendation: Heart Healthy / Carb Modified   Brief/Interim Summary: HPI: Kristin Hall is a 61 y.o. female with medical history significant of hypertension, hyperlipidemia, thrombocytosis, iron deficiency, headaches who is coming to the emergency department after having a LOC earlier today around 12:15 AM and hitting her head on the wall while falling. She does not know how long she was unconscious. She denies chest pain, palpitations, diaphoresis, dyspnea prior to the fall, but may have been feeling lightheaded. She states that she had fever from Thursday to yesterday Saturday associated with occipital headache and body aches. She denies having fever today. She denies rhinorrhea, sore throat or productive cough.   She has been taking Tylenol and cough medication since Thursday, but states that her fluid and food intake has been decreased. She took an aspirin yesterday, which did not help her symptoms. Today she complains of 4 episodes of diarrhea this morning, but denies abdominal pain, nausea, emesis, melena or hematochezia. She complains of mild urgency, but denies dysuria or gross hematuria.  ED Course: Workup in the emergency department shows EKG with LAD and baseline wander, but otherwise no significant changes. WBC of 8.5, hemoglobin 13.6 g/dL and platelets of 811. Her urine analysis shows 6-30 WBC, many bacteria, specific gravity of more than 1.030, small hemoglobin and small bilirubin and trace  ketones and protein 30 mg/dL. Negative leukocyte esterase and nitrites. Lactic acid was 1.8, sodium 136, potassium 3.3, chloride 99 and bicarbonate 25 mmol/L. Her lipase was 20 and AST 53 units. Her influenza A by PCR was positive. Her chest radiograph, CT of the head and CT of the cervical spine did not show any acute abnormalities.   The patient received 1000 mL of normal saline bolus, ceftriaxone 1 g IVPB, Tamiflu 75 mg and potassium chloride 40 mEq by mouth. She mentioned that she was feeling better with the treatment.  Vasovagal Syncope from orthostatic hypotension and dehydration Admit to observation/telemetry. Given IV hydration and electrolyte replacement. Troponin levels negative.  CT scan of the brain and cervical spine did not show any acute abnormalities. Pt decided to have echo done outpatient and did not want to stay and have done in hospital Carotid Doppler studies negative for any significant stenosis.     Influenza A Droplet precautions. Continue Tamiflu 75 mg by mouth twice a day. Analgesics, antiemetics and cough suppressants as needed.    Hyperlipemia Hold Vytorin tonight due to body aches and mildly increased AST level.    Essential hypertension Continue Cardizem 300 mg orally every 24 hours. Resume home meds at discharge.   AKI - resolved with IV hydration and back to normal prior to discharge    GERD Continue pantoprazole 40 mg by mouth daily    UTI (urinary tract infection) Continue Rocephin 1 g IVPB every 24 hours. Follow-up urine culture and sensitivity with PCP. Discharged on cefpodoxime for 5 days.     Hypokalemia Replacing. Check magnesium level. Follow-up potassium level in the morning.  DVT prophylaxis: Lovenox SQ. Code Status: Full code. Family  Communication:  Disposition Plan:  Home  Consults called:  Admission status: Observation/Telemetry.   Discharge Diagnoses:  Principal Problem:   Syncope Active Problems:    Hyperlipemia   Essential hypertension   GERD   UTI (urinary tract infection)   Hypokalemia   Influenza A  Discharge Instructions  Discharge Instructions    Increase activity slowly    Complete by:  As directed      Allergies as of 06/19/2016      Reactions   Amlodipine Other (See Comments)   Chest pain   Aspirin Other (See Comments)   Generalized aches   Metronidazole Hives, Itching   Valsartan Cough      Medication List    TAKE these medications   acetaminophen 500 MG tablet Commonly known as:  TYLENOL Take 1 tablet (500 mg total) by mouth every 8 (eight) hours as needed.   ALIVE WOMENS 50+ Tabs Take 1 tablet by mouth daily.   cefpodoxime 200 MG tablet Commonly known as:  VANTIN Take 1 tablet (200 mg total) by mouth 2 (two) times daily.   cyclobenzaprine 5 MG tablet Commonly known as:  FLEXERIL One tablet at bedtime as needed, for neck spasm   diltiazem 300 MG 24 hr capsule Commonly known as:  TIAZAC TAKE 1 CAPSULE BY MOUTH ONCE DAILY   oseltamivir 75 MG capsule Commonly known as:  TAMIFLU Take 1 capsule (75 mg total) by mouth 2 (two) times daily.   pantoprazole 40 MG tablet Commonly known as:  PROTONIX TAKE 1 TABLET BY MOUTH DAILY.   POLY-IRON 150 150 MG capsule Generic drug:  iron polysaccharides TAKE 1 CAPSULE (150 MG TOTAL) BY MOUTH DAILY.   potassium chloride SA 20 MEQ tablet Commonly known as:  K-DUR,KLOR-CON Take 2 tablets (40 mEq total) by mouth daily.   valsartan-hydrochlorothiazide 320-25 MG tablet Commonly known as:  DIOVAN-HCT TAKE 1 TABLET BY MOUTH ONCE DAILY   VYTORIN 10-10 MG tablet Generic drug:  ezetimibe-simvastatin TAKE 1 TABLET BY MOUTH AT BEDTIME FOR CHOLESTEROL.      Follow-up Information    Syliva Overman, MD. Schedule an appointment as soon as possible for a visit in 1 week(s).   Specialty:  Family Medicine Contact information: [address] [place] [address] [phone]          Allergies   Allergen Reactions  . Amlodipine Other (See Comments)    Chest pain  . Aspirin Other (See Comments)    Generalized aches  . Metronidazole Hives and Itching  . Valsartan Cough   Procedures/Studies: Dg Chest 2 View  Result Date: 06/18/2016 CLINICAL DATA:  Syncopal episode.  Fever and generalized weakness. EXAM: CHEST  2 VIEW COMPARISON:  Chest CT - 06/07/2015 FINDINGS: Normal cardiac silhouette and mediastinal contours. No focal parenchymal opacities. No pleural effusion or pneumothorax. No evidence of edema. No acute osseus abnormalities. IMPRESSION: No acute cardiopulmonary disease. Specifically, no evidence of pneumonia. Electronically Signed   By: Simonne Come M.D.   On: 06/18/2016 13:31   Ct Head Wo Contrast  Result Date: 06/18/2016 CLINICAL DATA:  Headache.  Syncope. EXAM: CT HEAD WITHOUT CONTRAST CT CERVICAL SPINE WITHOUT CONTRAST TECHNIQUE: Multidetector CT imaging of the head and cervical spine was performed following the standard protocol without intravenous contrast. Multiplanar CT image reconstructions of the cervical spine were also generated. COMPARISON:  None. FINDINGS: CT HEAD FINDINGS Brain: No evidence of acute infarction, hemorrhage, hydrocephalus, extra-axial collection or mass lesion/mass effect. Vascular: No hyperdense vessel or unexpected calcification.  Skull: Normal. Negative for fracture or focal lesion. Sinuses/Orbits: No acute finding. Other: None. CT CERVICAL SPINE FINDINGS Alignment: Normal. Skull base and vertebrae: No acute fracture. No primary bone lesion or focal pathologic process. Soft tissues and spinal canal: No prevertebral fluid or swelling. No visible canal hematoma. Disc levels:  No significant degenerative changes. Upper chest: Negative. Other: No other abnormalities. IMPRESSION: 1. No acute intracranial abnormality. 2. No fracture or traumatic malalignment. Electronically Signed   By: Gerome Sam III M.D   On: 06/18/2016 14:25   Ct Cervical Spine Wo  Contrast  Result Date: 06/18/2016 CLINICAL DATA:  Headache.  Syncope. EXAM: CT HEAD WITHOUT CONTRAST CT CERVICAL SPINE WITHOUT CONTRAST TECHNIQUE: Multidetector CT imaging of the head and cervical spine was performed following the standard protocol without intravenous contrast. Multiplanar CT image reconstructions of the cervical spine were also generated. COMPARISON:  None. FINDINGS: CT HEAD FINDINGS Brain: No evidence of acute infarction, hemorrhage, hydrocephalus, extra-axial collection or mass lesion/mass effect. Vascular: No hyperdense vessel or unexpected calcification. Skull: Normal. Negative for fracture or focal lesion. Sinuses/Orbits: No acute finding. Other: None. CT CERVICAL SPINE FINDINGS Alignment: Normal. Skull base and vertebrae: No acute fracture. No primary bone lesion or focal pathologic process. Soft tissues and spinal canal: No prevertebral fluid or swelling. No visible canal hematoma. Disc levels:  No significant degenerative changes. Upper chest: Negative. Other: No other abnormalities. IMPRESSION: 1. No acute intracranial abnormality. 2. No fracture or traumatic malalignment. Electronically Signed   By: Gerome Sam III M.D   On: 06/18/2016 14:25   US Carotid Bilateral  Result Date: 06/19/2016 CLINICAL DATA:  Syncope.  Fall. EXAM: BILATERAL CAROTID DUPLEX ULTRASOUND TECHNIQUE: Wallace Cullens scale imaging, color Doppler and duplex ultrasound were performed of bilateral carotid and vertebral arteries in the neck. COMPARISON:  10/08/2003 FINDINGS: Criteria: Quantification of carotid stenosis is based on velocity parameters that correlate the residual internal carotid diameter with NASCET-based stenosis levels, using the diameter of the distal internal carotid lumen as the denominator for stenosis measurement. The following velocity measurements were obtained: RIGHT ICA:  105 cm/sec CCA:  92 cm/sec SYSTOLIC ICA/CCA RATIO:  1.1 DIASTOLIC ICA/CCA RATIO:  1.5 ECA:  74 cm/sec LEFT ICA:  70 cm/sec CCA:   106 cm/sec SYSTOLIC ICA/CCA RATIO:  0.7 DIASTOLIC ICA/CCA RATIO:  0.7 ECA:  100 cm/sec RIGHT CAROTID ARTERY: Minimal plaque at the carotid bulb and proximal internal carotid artery. Normal waveforms and velocities in the internal carotid artery. External carotid artery is patent with normal waveform. RIGHT VERTEBRAL ARTERY: Antegrade flow and normal waveform in the right vertebral artery. LEFT CAROTID ARTERY: Left carotid arteries are patent without significant plaque. Normal waveforms and velocities in the internal carotid artery. LEFT VERTEBRAL ARTERY: Antegrade flow and normal waveform in the left vertebral artery. IMPRESSION: Minimal atherosclerotic disease in the carotid arteries. No significant carotid artery stenosis. Estimated degree of stenosis in the internal carotid arteries is less than 50% bilaterally. Patent vertebral arteries. Electronically Signed   By: Richarda Overlie M.D.   On: 06/19/2016 09:37    (Echo, Carotid, EGD, Colonoscopy, ERCP)    Subjective: Pt says she feels much better today.  She really wants to go home.   Discharge Exam: Vitals:   06/19/16 0520 06/19/16 0920  BP: (!) 97/49 114/76  Pulse: 81 80  Resp: 16 16  Temp: 99 F (37.2 C) 98.2 F (36.8 C)   Vitals:   06/18/16 2120 06/19/16 0120 06/19/16 0520 06/19/16 0920  BP: (!) 103/54 107/68 (!) 97/49  114/76  Pulse: 71 76 81 80  Resp: 16 16 16 16   Temp: 97.7 F (36.5 C) 99.1 F (37.3 C) 99 F (37.2 C) 98.2 F (36.8 C)  TempSrc: Oral Oral Oral   SpO2: 95% 97% 98% 99%  Weight:   79.9 kg (176 lb 1.6 oz)   Height:       General: Pt is alert, awake, not in acute distress Cardiovascular: RRR, S1/S2 +, no rubs, no gallops Respiratory: CTA bilaterally, no wheezing, no rhonchi Abdominal: Soft, NT, ND, bowel sounds + Extremities: no edema, no cyanosis  The results of significant diagnostics from this hospitalization (including imaging, microbiology, ancillary and laboratory) are listed below for reference.      Microbiology: No results found for this or any previous visit (from the past 240 hour(s)).   Labs: BNP (last 3 results) No results for input(s): BNP in the last 8760 hours. Basic Metabolic Panel:  Recent Labs Lab 06/18/16 1239 06/19/16 0440  NA 136 137  K 3.3* 4.1  CL 99* 106  CO2 25 24  GLUCOSE 122* 77  BUN 23* 16  CREATININE 1.15* 0.78  CALCIUM 9.3 8.1*  MG 2.1  --    Liver Function Tests:  Recent Labs Lab 06/18/16 1239 06/19/16 0440  AST 53* 32  ALT 30 19  ALKPHOS 112 80  BILITOT 1.0 0.6  PROT 7.9 6.0*  ALBUMIN 4.0 3.0*    Recent Labs Lab 06/18/16 1239  LIPASE 20   No results for input(s): AMMONIA in the last 168 hours. CBC:  Recent Labs Lab 06/18/16 1239 06/19/16 0440  WBC 8.5 5.1  NEUTROABS 5.2  --   HGB 13.8 11.4*  HCT 41.6 35.2*  MCV 87.8 89.3  PLT 388 333   Cardiac Enzymes:  Recent Labs Lab 06/18/16 1239  TROPONINI <0.03   BNP: Invalid input(s): POCBNP CBG: No results for input(s): GLUCAP in the last 168 hours. D-Dimer No results for input(s): DDIMER in the last 72 hours. Hgb A1c No results for input(s): HGBA1C in the last 72 hours. Lipid Profile No results for input(s): CHOL, HDL, LDLCALC, TRIG, CHOLHDL, LDLDIRECT in the last 72 hours. Thyroid function studies No results for input(s): TSH, T4TOTAL, T3FREE, THYROIDAB in the last 72 hours.  Invalid input(s): FREET3 Anemia work up No results for input(s): VITAMINB12, FOLATE, FERRITIN, TIBC, IRON, RETICCTPCT in the last 72 hours. Urinalysis    Component Value Date/Time   COLORURINE YELLOW 06/18/2016 1215   APPEARANCEUR CLOUDY (A) 06/18/2016 1215   LABSPEC >1.030 (H) 06/18/2016 1215   PHURINE 5.5 06/18/2016 1215   GLUCOSEU NEGATIVE 06/18/2016 1215   HGBUR SMALL (A) 06/18/2016 1215   BILIRUBINUR SMALL (A) 06/18/2016 1215   BILIRUBINUR neg 03/03/2016 0839   KETONESUR TRACE (A) 06/18/2016 1215   PROTEINUR 30 (A) 06/18/2016 1215   UROBILINOGEN 0.2 03/03/2016 0839    UROBILINOGEN 0.2 02/25/2014 1707   NITRITE NEGATIVE 06/18/2016 1215   LEUKOCYTESUR NEGATIVE 06/18/2016 1215   Sepsis Labs Invalid input(s): PROCALCITONIN,  WBC,  LACTICIDVEN Microbiology No results found for this or any previous visit (from the past 240 hour(s)).  Time coordinating discharge: Over 30 minutes  SIGNED:  Standley Dakins, MD  [facility] 06/19/2016, 10:50 AM Pager   If 7PM-7AM, please contact night-coverage [email or web address] Password [email or web address]

## 2016-06-20 ENCOUNTER — Telehealth: Payer: Self-pay

## 2016-06-20 NOTE — Progress Notes (Signed)
Patient discharged with instructions given on medications,and follow up visits,patient verbalized understanding. Prescriptions sent to Pharmacy of choice documented on AVS. Staff accompanied patient to an awaiting vehicle. 

## 2016-06-23 ENCOUNTER — Ambulatory Visit (HOSPITAL_COMMUNITY): Payer: 59

## 2016-06-23 ENCOUNTER — Ambulatory Visit (HOSPITAL_COMMUNITY): Admission: RE | Admit: 2016-06-23 | Payer: 59 | Source: Ambulatory Visit

## 2016-06-26 ENCOUNTER — Encounter: Payer: Self-pay | Admitting: Family Medicine

## 2016-06-26 ENCOUNTER — Ambulatory Visit (INDEPENDENT_AMBULATORY_CARE_PROVIDER_SITE_OTHER): Payer: 59 | Admitting: Family Medicine

## 2016-06-26 VITALS — BP 108/70 | HR 83 | Resp 16 | Ht 63.0 in | Wt 175.1 lb

## 2016-06-26 DIAGNOSIS — R55 Syncope and collapse: Secondary | ICD-10-CM

## 2016-06-26 DIAGNOSIS — I10 Essential (primary) hypertension: Secondary | ICD-10-CM | POA: Diagnosis not present

## 2016-06-26 DIAGNOSIS — Z09 Encounter for follow-up examination after completed treatment for conditions other than malignant neoplasm: Secondary | ICD-10-CM | POA: Diagnosis not present

## 2016-06-26 DIAGNOSIS — J101 Influenza due to other identified influenza virus with other respiratory manifestations: Secondary | ICD-10-CM

## 2016-06-26 NOTE — Patient Instructions (Addendum)
Physical exam/ preventive visit May 20 or after   CBC and chem 7 today  You are referred  For echocardiogram,    ALWAYS listen to your body, continue current meds Thank you  for choosing [facility]. We consider it a privelige to serve you.  Delivering excellent health care in a caring and  compassionate way is our goal.  Partnering with you,  so that together we can achieve this goal is our strategy.

## 2016-06-27 LAB — BASIC METABOLIC PANEL
BUN: 10 mg/dL (ref 7–25)
CALCIUM: 9.8 mg/dL (ref 8.6–10.4)
CO2: 28 mmol/L (ref 20–31)
CREATININE: 0.91 mg/dL (ref 0.50–1.05)
Chloride: 103 mmol/L (ref 98–110)
Glucose, Bld: 87 mg/dL (ref 65–99)
Potassium: 4.3 mmol/L (ref 3.5–5.3)
SODIUM: 143 mmol/L (ref 135–146)

## 2016-06-27 LAB — CBC
HCT: 36.7 % (ref 35.0–45.0)
Hemoglobin: 12.1 g/dL (ref 11.7–15.5)
MCH: 28.7 pg (ref 27.0–33.0)
MCHC: 33 g/dL (ref 32.0–36.0)
MCV: 87.2 fL (ref 80.0–100.0)
MPV: 10.3 fL (ref 7.5–12.5)
PLATELETS: 509 10*3/uL — AB (ref 140–400)
RBC: 4.21 MIL/uL (ref 3.80–5.10)
RDW: 13.5 % (ref 11.0–15.0)
WBC: 11.4 10*3/uL — ABNORMAL HIGH (ref 3.8–10.8)

## 2016-06-29 ENCOUNTER — Encounter: Payer: Self-pay | Admitting: Family Medicine

## 2016-06-29 NOTE — Progress Notes (Signed)
Kristin Hall     MRN: [id number]      DOB: 07/10/1955   HPI Kristin Hall  Pt here for follow up of  hospitalization.for influenza and syncope Discharge summary, and laboratory and radiology data are reviewed, and any questions or concerns about recent hospitalization are discussed. Specific issues requiring follow up are specifically addressed.  ROS C/o mild chest congestion,and cough Denies chest pains, palpitations and leg swelling Denies abdominal pain, nausea, vomiting,diarrhea or constipation.   Denies dysuria, frequency, hesitancy or incontinence. Denies joint pain, swelling and limitation in mobility. Denies headaches, seizures, numbness, or tingling. C/o increased stress due to recent passing of her aunt and resulting family confusion Denies skin break down or rash.   PE  BP 108/70   Pulse 83   Resp 16   Ht 5\' 3"  (1.6 m)   Wt 175 lb 1.9 oz (79.4 kg)   SpO2 97%   BMI 31.02 kg/m   Patient alert and oriented and in no cardiopulmonary distress.  HEENT: No facial asymmetry, EOMI,   oropharynx pink and moist.  Neck supple no JVD, no mass.No sinus tenderness, TM clear bilaterally  Chest: Clear to auscultation bilaterally.  CVS: S1, S2 no murmurs, no S3.Regular rate.  ABD: Soft non tender.   Ext: No edema  MS: Adequate ROM spine, shoulders, hips and knees.  Skin: Intact, no ulcerations or rash noted.  Psych: Good eye contact, normal affect. Memory intact not anxious or depressed appearing.  CNS: CN 2-12 intact, power,  normal throughout.no focal deficits noted.   Assessment & Plan  Hospital discharge follow-up Follow up as documented. Pt back to work, but I have advised her to take it easy for an additional 7 to 10 days as se seems somewhat weak Referred to cardiology for echocardiogram to further asses the syncope  Syncope Needs cardiology to eval with echocardiogram referral entered  Essential hypertension Controlled, no change in medication DASH  diet and commitment to daily physical activity for a minimum of 30 minutes discussed and encouraged, as a part of hypertension management. The importance of attaining a healthy weight is also discussed.  BP/Weight 06/26/2016 06/19/2016 05/01/2016 03/02/2016 12/09/2015 09/16/2015 08/09/2015  Systolic BP 108 114 129 124 149 128 135  Diastolic BP 70 76 52 84 62 84 56  Wt. (Lbs) 175.12 176.1 179 170 179.3 176.12 176.9  BMI 31.02 31.19 31.71 30.11 31.76 31.21 31.34       Influenza A Acute infection requiring hospitalization

## 2016-06-30 DIAGNOSIS — Z09 Encounter for follow-up examination after completed treatment for conditions other than malignant neoplasm: Secondary | ICD-10-CM | POA: Insufficient documentation

## 2016-06-30 NOTE — Assessment & Plan Note (Signed)
Controlled, no change in medication DASH diet and commitment to daily physical activity for a minimum of 30 minutes discussed and encouraged, as a part of hypertension management. The importance of attaining a healthy weight is also discussed.  BP/Weight 06/26/2016 06/19/2016 05/01/2016 03/02/2016 12/09/2015 09/16/2015 0000000  Systolic BP [id number] [id number] [id number] 0000000 [id number]  Diastolic BP 70 76 52 84 62 84 56  Wt. (Lbs) 175.12 176.1 179 170 179.3 176.12 176.9  BMI 31.02 31.19 31.71 30.11 31.76 31.21 31.34

## 2016-06-30 NOTE — Assessment & Plan Note (Signed)
Needs cardiology to eval with echocardiogram referral entered

## 2016-06-30 NOTE — Assessment & Plan Note (Signed)
Follow up as documented. Pt back to work, but I have advised her to take it easy for an additional 7 to 10 days as se seems somewhat weak Referred to cardiology for echocardiogram to further asses the syncope

## 2016-06-30 NOTE — Assessment & Plan Note (Signed)
Acute infection requiring hospitalization

## 2016-07-03 ENCOUNTER — Telehealth: Payer: Self-pay | Admitting: Family Medicine

## 2016-07-03 NOTE — Telephone Encounter (Signed)
Kristin Hall is asking if Dr. Moshe Cipro was going to put her an order in for an Echo, please advise?

## 2016-07-05 NOTE — Telephone Encounter (Signed)
Noted that patient has been referred to Cardiology.

## 2016-07-05 NOTE — Telephone Encounter (Signed)
Called and left message for patient notifying that cardiology will take care of the ordering of echo.

## 2016-07-10 MED FILL — PANTOPRAZOLE SOD DR 40 MG T: 40 | 90 days supply | Qty: 90 | Fill #2

## 2016-07-10 MED FILL — POLY-IRON 150 MG CAPSULE: 150 | 30 days supply | Qty: 30 | Fill #3

## 2016-07-11 ENCOUNTER — Other Ambulatory Visit: Payer: Self-pay

## 2016-07-11 ENCOUNTER — Other Ambulatory Visit: Payer: Self-pay | Admitting: Family Medicine

## 2016-07-11 DIAGNOSIS — Z1231 Encounter for screening mammogram for malignant neoplasm of breast: Secondary | ICD-10-CM

## 2016-07-11 MED ORDER — POTASSIUM CHLORIDE CRYS ER 20 MEQ PO TBCR: 40.0000 meq | EXTENDED_RELEASE_TABLET | Freq: Every day | ORAL | 3 refills | Status: DC

## 2016-07-11 MED FILL — KLOR-CON M20 TABLET: 20 | 90 days supply | Qty: 180 | Fill #0

## 2016-07-11 NOTE — Telephone Encounter (Signed)
[facility]

## 2016-07-13 NOTE — Telephone Encounter (Signed)
Unable to reach patient.

## 2016-07-14 ENCOUNTER — Encounter: Payer: Self-pay | Admitting: Adult Health

## 2016-07-14 ENCOUNTER — Ambulatory Visit (INDEPENDENT_AMBULATORY_CARE_PROVIDER_SITE_OTHER): Payer: 59 | Admitting: Adult Health

## 2016-07-14 VITALS — BP 122/80 | HR 91 | Ht 63.0 in | Wt 178.0 lb

## 2016-07-14 DIAGNOSIS — I10 Essential (primary) hypertension: Secondary | ICD-10-CM | POA: Diagnosis not present

## 2016-07-14 NOTE — Patient Instructions (Signed)

## 2016-07-14 NOTE — Progress Notes (Signed)
Cardiology Office Note   Date:  07/14/2016   ID:  DRISHTI SYBESMA, DOB 1955-06-13, MRN [id number]  PCP:  Syliva Overman, MD  Cardiologist: Inis Sizer, NP   No chief complaint on file.     History of Present Illness: Kristin Hall is a 61 y.o. female who presents for ongoing assessment and management of hypertension, hyperlipidemia, with history of anxiety. She was last seen in the office by Dr. Purvis Sheffield on 06/24/2015, after being evaluated in the emergency room for chest pain. She was found to have been hypokalemic and this was repleted. He was not felt that her chest pain was cardiac in etiology and she was having chest wall tenderness and pain excess worsened  by bending over and then standing upright. He felt it was muscle skeletal etiology. She was continued on medical management. She is not planned for stress testing.  She was recently hospitalized with the flu and AKI. She was treated and released after two days. She feels much better now and has gotten her energy back. BP was very low during admission and antihypertensives were discontinued temporarily.  She is now back on usual medical regimen. She has seen Dr. Lodema Hong, PCP since discharge.   Past Medical History:  Diagnosis Date  . Elevated alkaline phosphatase level   . Headache   . Hyperlipidemia   . Hypertension   . Thrombocytosis (HCC) 07/23/2015    Past Surgical History:  Procedure Laterality Date  . ABDOMINAL HYSTERECTOMY     partial   . COLONOSCOPY  10/04/2006   SLF: Normal retroflexed view of the rectum/Normal colon without evidence of polyps, masses, inflammatory changes, diverticula or arteriovenous malformations     Current Outpatient Prescriptions  Medication Sig Dispense Refill  . acetaminophen (TYLENOL) 500 MG tablet Take 1 tablet (500 mg total) by mouth every 8 (eight) hours as needed. 30 tablet 0  . diltiazem (TIAZAC) 300 MG 24 hr capsule TAKE 1 CAPSULE BY MOUTH ONCE DAILY 90  capsule 1  . Multiple Vitamins-Minerals (ALIVE WOMENS 50+) TABS Take 1 tablet by mouth daily.    . pantoprazole (PROTONIX) 40 MG tablet TAKE 1 TABLET BY MOUTH DAILY. 90 tablet PRN  . POLY-IRON 150 150 MG capsule TAKE 1 CAPSULE (150 MG TOTAL) BY MOUTH DAILY. 30 capsule 5  . potassium chloride SA (K-DUR,KLOR-CON) 20 MEQ tablet Take 2 tablets (40 mEq total) by mouth daily. 180 tablet 3  . valsartan-hydrochlorothiazide (DIOVAN-HCT) 320-25 MG tablet TAKE 1 TABLET BY MOUTH ONCE DAILY 90 tablet 1  . VYTORIN 10-10 MG per tablet TAKE 1 TABLET BY MOUTH AT BEDTIME FOR CHOLESTEROL. 30 tablet 5  . cyclobenzaprine (FLEXERIL) 5 MG tablet One tablet at bedtime as needed, for neck spasm (Patient not taking: Reported on 07/14/2016) 20 tablet 0   No current facility-administered medications for this visit.     Allergies:   Amlodipine; Aspirin; Metronidazole; and Valsartan    Social History:  The patient  reports that she has never smoked. She has never used smokeless tobacco. She reports that she does not drink alcohol or use drugs.   Family History:  The patient's family history includes Cancer in her father and mother; Dementia in her mother.    ROS: All other systems are reviewed and negative. Unless otherwise mentioned in H&P    PHYSICAL EXAM: VS:  BP 122/80   Pulse 91   Ht 5\' 3"  (1.6 m)   Wt 178 lb (80.7 kg)   SpO2 93%   BMI  31.53 kg/m  , BMI Body mass index is 31.53 kg/m. GEN: Well nourished, well developed, in no acute distress  HEENT: normal  Neck: no JVD, carotid bruits, or masses Cardiac: RRR; no murmurs, rubs, or gallops,no edema  Respiratory:  clear to auscultation bilaterally, normal work of breathing GI: soft, nontender, nondistended, + BS MS: no deformity or atrophy  Skin: warm and dry, no rash Neuro:  Strength and sensation are intact Psych: euthymic mood, full affect   Recent Labs: 06/18/2016: Magnesium 2.1 06/19/2016: ALT 19 06/26/2016: BUN 10; Creat 0.91; Hemoglobin 12.1;  Platelets 509; Potassium 4.3; Sodium 143    Lipid Panel    Component Value Date/Time   CHOL 137 02/04/2016 0856   TRIG 98 02/04/2016 0856   HDL 35 (L) 02/04/2016 0856   CHOLHDL 3.9 02/04/2016 0856   VLDL 20 02/04/2016 0856   LDLCALC 82 02/04/2016 0856      Wt Readings from Last 3 Encounters:  07/14/16 178 lb (80.7 kg)  06/26/16 175 lb 1.9 oz (79.4 kg)  06/19/16 176 lb 1.6 oz (79.9 kg)      Other studies Reviewed:   ASSESSMENT AND PLAN:  1. Hypertension: Well controlled currently. She was hypotensive with influenza but has recovered and is back on her usual mediations. Will have echocardiogram completed for assessment of LV function and diastolic dysfunction grade as baseline  No changes in her mediations at this time.   2. Hyperlipidemia: Continue Vytorin. Labs checked by Dr. Lodema Hong.   3. Syncope: Associated with dehydration and influenza. No further episodes.    Current medicines are reviewed at length with the patient today.    Labs/ tests ordered today include:   Orders Placed This Encounter  Procedures  . ECHOCARDIOGRAM COMPLETE     Disposition:   FU with 6 months unless symptomatic   Signed, Joni Reining, NP  07/14/2016 4:20 PM    [facility] [address] [place], [place] [address] Phone: [phone]; Fax: [phone]

## 2016-07-14 NOTE — Progress Notes (Signed)
Name: Kristin Hall    DOB: 19-Nov-1955  Age: 61 y.o.  MR#: [id number]       PCP:  Syliva Overman, MD      Insurance: Payor: [employer] / Plan: [employer] / Product Type: *No Product type* /   CC:   No chief complaint on file.   VS Vitals:   07/14/16 1531  Weight: 178 lb (80.7 kg)  Height: 5\' 3"  (1.6 m)    Weights Current Weight  07/14/16 178 lb (80.7 kg)  06/26/16 175 lb 1.9 oz (79.4 kg)  06/19/16 176 lb 1.6 oz (79.9 kg)    Blood Pressure  BP Readings from Last 3 Encounters:  06/26/16 108/70  06/19/16 114/76  05/01/16 (!) 129/52     Admit date:  (Not on file) Last encounter with RMR:  Visit date not found   Allergy Amlodipine; Aspirin; Metronidazole; and Valsartan  Current Outpatient Prescriptions  Medication Sig Dispense Refill  . acetaminophen (TYLENOL) 500 MG tablet Take 1 tablet (500 mg total) by mouth every 8 (eight) hours as needed. 30 tablet 0  . diltiazem (TIAZAC) 300 MG 24 hr capsule TAKE 1 CAPSULE BY MOUTH ONCE DAILY 90 capsule 1  . Multiple Vitamins-Minerals (ALIVE WOMENS 50+) TABS Take 1 tablet by mouth daily.    . pantoprazole (PROTONIX) 40 MG tablet TAKE 1 TABLET BY MOUTH DAILY. 90 tablet PRN  . POLY-IRON 150 150 MG capsule TAKE 1 CAPSULE (150 MG TOTAL) BY MOUTH DAILY. 30 capsule 5  . potassium chloride SA (K-DUR,KLOR-CON) 20 MEQ tablet Take 2 tablets (40 mEq total) by mouth daily. 180 tablet 3  . valsartan-hydrochlorothiazide (DIOVAN-HCT) 320-25 MG tablet TAKE 1 TABLET BY MOUTH ONCE DAILY 90 tablet 1  . VYTORIN 10-10 MG per tablet TAKE 1 TABLET BY MOUTH AT BEDTIME FOR CHOLESTEROL. 30 tablet 5  . cyclobenzaprine (FLEXERIL) 5 MG tablet One tablet at bedtime as needed, for neck spasm (Patient not taking: Reported on 07/14/2016) 20 tablet 0   No current facility-administered medications for this visit.     Discontinued Meds:   There are no discontinued medications.  Patient Active Problem List   Diagnosis Date Noted  . Hospital discharge  follow-up 06/30/2016  . Syncope 06/18/2016  . Hypokalemia 06/18/2016  . Influenza A 06/18/2016  . Neck muscle spasm 03/05/2016  . Iron deficiency 08/09/2015  . Thrombocytosis (HCC) 07/23/2015  . Insomnia 05/31/2015  . Obesity 01/01/2015  . Elevated alkaline phosphatase level 04/21/2012  . 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 GERD 04/20/2010  . Hyperlipemia 11/12/2007  . Essential hypertension 11/12/2007    LABS    Component Value Date/Time   NA 143 06/26/2016 1510   NA 137 06/19/2016 0440   NA 136 06/18/2016 1239   K 4.3 06/26/2016 1510   K 4.1 06/19/2016 0440   K 3.3 (L) 06/18/2016 1239   CL 103 06/26/2016 1510   CL 106 06/19/2016 0440   CL 99 (L) 06/18/2016 1239   CO2 28 06/26/2016 1510   CO2 24 06/19/2016 0440   CO2 25 06/18/2016 1239   GLUCOSE 87 06/26/2016 1510   GLUCOSE 77 06/19/2016 0440   GLUCOSE 122 (H) 06/18/2016 1239   BUN 10 06/26/2016 1510   BUN 16 06/19/2016 0440   BUN 23 (H) 06/18/2016 1239   CREATININE 0.91 06/26/2016 1510   CREATININE 0.78 06/19/2016 0440   CREATININE 1.15 (H) 06/18/2016 1239   CREATININE 0.88 02/04/2016 0856   CREATININE 0.86 09/13/2015 1546   CALCIUM 9.8  06/26/2016 1510   CALCIUM 8.1 (L) 06/19/2016 0440   CALCIUM 9.3 06/18/2016 1239   GFRNONAA >60 06/19/2016 0440   GFRNONAA 51 (L) 06/18/2016 1239   GFRNONAA 72 02/04/2016 0856   GFRNONAA 75 09/13/2015 1546   GFRNONAA >60 06/07/2015 0626   GFRAA >60 06/19/2016 0440   GFRAA 59 (L) 06/18/2016 1239   GFRAA 83 02/04/2016 0856   GFRAA 86 09/13/2015 1546   GFRAA >60 06/07/2015 0626   CMP     Component Value Date/Time   NA 143 06/26/2016 1510   K 4.3 06/26/2016 1510   CL 103 06/26/2016 1510   CO2 28 06/26/2016 1510   GLUCOSE 87 06/26/2016 1510   BUN 10 06/26/2016 1510   CREATININE 0.91 06/26/2016 1510   CALCIUM 9.8 06/26/2016 1510   PROT 6.0 (L) 06/19/2016 0440   ALBUMIN 3.0 (L) 06/19/2016 0440   AST 32 06/19/2016 0440   ALT 19 06/19/2016 0440   ALKPHOS 80 06/19/2016  0440   BILITOT 0.6 06/19/2016 0440   GFRNONAA >60 06/19/2016 0440   GFRNONAA 72 02/04/2016 0856   GFRAA >60 06/19/2016 0440   GFRAA 83 02/04/2016 0856       Component Value Date/Time   WBC 11.4 (H) 06/26/2016 1510   WBC 5.1 06/19/2016 0440   WBC 8.5 06/18/2016 1239   HGB 12.1 06/26/2016 1510   HGB 11.4 (L) 06/19/2016 0440   HGB 13.8 06/18/2016 1239   HCT 36.7 06/26/2016 1510   HCT 35.2 (L) 06/19/2016 0440   HCT 41.6 06/18/2016 1239   MCV 87.2 06/26/2016 1510   MCV 89.3 06/19/2016 0440   MCV 87.8 06/18/2016 1239    Lipid Panel     Component Value Date/Time   CHOL 137 02/04/2016 0856   TRIG 98 02/04/2016 0856   HDL 35 (L) 02/04/2016 0856   CHOLHDL 3.9 02/04/2016 0856   VLDL 20 02/04/2016 0856   LDLCALC 82 02/04/2016 0856    ABG    Component Value Date/Time   TCO2 29 06/07/2015 0634     Lab Results  Component Value Date   TSH 1.379 12/14/2014   BNP (last 3 results) No results for input(s): BNP in the last 8760 hours.  ProBNP (last 3 results) No results for input(s): PROBNP in the last 8760 hours.  Cardiac Panel (last 3 results) No results for input(s): CKTOTAL, CKMB, TROPONINI, RELINDX in the last 72 hours.  Iron/TIBC/Ferritin/ %Sat    Component Value Date/Time   IRON 45 05/01/2016 1236   TIBC 363 05/01/2016 1236   FERRITIN 69 05/01/2016 1236   IRONPCTSAT 12 05/01/2016 1236     EKG Orders placed or performed during the hospital encounter of 06/18/16  . ED EKG  . ED EKG  . EKG     Prior Assessment and Plan Problem List as of 07/14/2016 Reviewed: 06/30/2016 10:20 PM by Syliva Overman, MD     Cardiovascular and Mediastinum   Essential hypertension   Last Assessment & Plan 06/26/2016 Office Visit Written 06/30/2016 10:37 PM by Kerri Perches, MD    Controlled, no change in medication DASH diet and commitment to daily physical activity for a minimum of 30 minutes discussed and encouraged, as a part of hypertension management. The importance of  attaining a healthy weight is also discussed.  BP/Weight 06/26/2016 06/19/2016 05/01/2016 03/02/2016 12/09/2015 09/16/2015 08/09/2015  Systolic BP 108 114 129 124 149 128 135  Diastolic BP 70 76 52 84 62 84 56  Wt. (Lbs) 175.12 176.1 179 170 179.3  176.12 176.9  BMI 31.02 31.19 31.71 30.11 31.76 31.21 31.34           Syncope   Last Assessment & Plan 06/26/2016 Office Visit Written 06/30/2016 10:20 PM by Kerri Perches, MD    Needs cardiology to eval with echocardiogram referral entered        Respiratory   Influenza A   Last Assessment & Plan 06/26/2016 Office Visit Written 06/30/2016 10:38 PM by Kerri Perches, MD    Acute infection requiring hospitalization        Digestive   DYSKINESIA OF ESOPHAGUS   GERD   Last Assessment & Plan 03/02/2016 Office Visit Written 03/05/2016 11:30 AM by Kerri Perches, MD    Controlled, no change in medication         Musculoskeletal and Integument   Neck muscle spasm   Last Assessment & Plan 03/02/2016 Office Visit Written 03/05/2016 11:31 AM by Kerri Perches, MD    Short term muscle relaxant as needed        Hematopoietic and Hemostatic   Thrombocytosis [facility])   Last Assessment & Plan 05/01/2016 Office Visit Edited 05/01/2016  1:21 PM by Ellouise Newer, PA-C    Minimal thrombocytosis dating back to at least January 2010 with intermittent upper limits of normal platelet counts. Preservation of WBC, RBC, hemoglobin and hematocrit is noted. Work-up was unrevealing except for a mild iron deficiency which can cause a mild thrombocytosis.  As a result, she was started on PO iron replacement in March 2017.  Labs today: CBC diff, ferritin.  I personally reviewed and went over laboratory results with the patient.  The results are noted within this dictation.  Platelet count is higher-limites normal to minimally elevated.  It is improved of PO iron replacement.  She will continue with PO iron as directed.  She is tolerating this very  well.   Release from clinic with ongoing follow-up with primary care provider.        Other   Hyperlipemia   Last Assessment & Plan 03/02/2016 Office Visit Written 03/05/2016 11:29 AM by Kerri Perches, MD    Hyperlipidemia:Low fat diet discussed and encouraged.   Lipid Panel  Lab Results  Component Value Date   CHOL 137 02/04/2016   HDL 35 (L) 02/04/2016   LDLCALC 82 02/04/2016   TRIG 98 02/04/2016   CHOLHDL 3.9 02/04/2016  needs to increase exercise , otherwise controlled          Elevated alkaline phosphatase level   Last Assessment & Plan 08/13/2013 Telephone Written 08/13/2013 11:33 AM by [place] Bali, MD    CHECK HFP ONE A YEAR BY PCP OR GI      Obesity   Last Assessment & Plan 03/02/2016 Office Visit Written 03/05/2016 11:30 AM by Kerri Perches, MD    Improved. Pt applauded on succesful weight loss through lifestyle change, and encouraged to continue same. Weight loss goal set for the next several months.       Insomnia   Last Assessment & Plan 08/09/2015 Office Visit Written 08/15/2015  8:07 PM by Kerri Perches, MD    Sleep hygiene reviewed and written information offered also. Uses medication very infrequently, relying on behavior modification , which is good       Iron deficiency   Hypokalemia   Hospital discharge follow-up   Last Assessment & Plan 06/26/2016 Office Visit Written 06/30/2016 10:19 PM by Kerri Perches, MD    Follow up  as documented. Pt back to work, but I have advised her to take it easy for an additional 7 to 10 days as se seems somewhat weak Referred to cardiology for echocardiogram to further asses the syncope          Imaging: Dg Chest 2 View  Result Date: 06/18/2016 CLINICAL DATA:  Syncopal episode.  Fever and generalized weakness. EXAM: CHEST  2 VIEW COMPARISON:  Chest CT - 06/07/2015 FINDINGS: Normal cardiac silhouette and mediastinal contours. No focal parenchymal opacities. No pleural effusion or pneumothorax.  No evidence of edema. No acute osseus abnormalities. IMPRESSION: No acute cardiopulmonary disease. Specifically, no evidence of pneumonia. Electronically Signed   By: Simonne Come M.D.   On: 06/18/2016 13:31   Ct Head Wo Contrast  Result Date: 06/18/2016 CLINICAL DATA:  Headache.  Syncope. EXAM: CT HEAD WITHOUT CONTRAST CT CERVICAL SPINE WITHOUT CONTRAST TECHNIQUE: Multidetector CT imaging of the head and cervical spine was performed following the standard protocol without intravenous contrast. Multiplanar CT image reconstructions of the cervical spine were also generated. COMPARISON:  None. FINDINGS: CT HEAD FINDINGS Brain: No evidence of acute infarction, hemorrhage, hydrocephalus, extra-axial collection or mass lesion/mass effect. Vascular: No hyperdense vessel or unexpected calcification. Skull: Normal. Negative for fracture or focal lesion. Sinuses/Orbits: No acute finding. Other: None. CT CERVICAL SPINE FINDINGS Alignment: Normal. Skull base and vertebrae: No acute fracture. No primary bone lesion or focal pathologic process. Soft tissues and spinal canal: No prevertebral fluid or swelling. No visible canal hematoma. Disc levels:  No significant degenerative changes. Upper chest: Negative. Other: No other abnormalities. IMPRESSION: 1. No acute intracranial abnormality. 2. No fracture or traumatic malalignment. Electronically Signed   By: Gerome Sam III M.D   On: 06/18/2016 14:25   Ct Cervical Spine Wo Contrast  Result Date: 06/18/2016 CLINICAL DATA:  Headache.  Syncope. EXAM: CT HEAD WITHOUT CONTRAST CT CERVICAL SPINE WITHOUT CONTRAST TECHNIQUE: Multidetector CT imaging of the head and cervical spine was performed following the standard protocol without intravenous contrast. Multiplanar CT image reconstructions of the cervical spine were also generated. COMPARISON:  None. FINDINGS: CT HEAD FINDINGS Brain: No evidence of acute infarction, hemorrhage, hydrocephalus, extra-axial collection or mass  lesion/mass effect. Vascular: No hyperdense vessel or unexpected calcification. Skull: Normal. Negative for fracture or focal lesion. Sinuses/Orbits: No acute finding. Other: None. CT CERVICAL SPINE FINDINGS Alignment: Normal. Skull base and vertebrae: No acute fracture. No primary bone lesion or focal pathologic process. Soft tissues and spinal canal: No prevertebral fluid or swelling. No visible canal hematoma. Disc levels:  No significant degenerative changes. Upper chest: Negative. Other: No other abnormalities. IMPRESSION: 1. No acute intracranial abnormality. 2. No fracture or traumatic malalignment. Electronically Signed   By: Gerome Sam III M.D   On: 06/18/2016 14:25   US Carotid Bilateral  Result Date: 06/19/2016 CLINICAL DATA:  Syncope.  Fall. EXAM: BILATERAL CAROTID DUPLEX ULTRASOUND TECHNIQUE: Wallace Cullens scale imaging, color Doppler and duplex ultrasound were performed of bilateral carotid and vertebral arteries in the neck. COMPARISON:  10/08/2003 FINDINGS: Criteria: Quantification of carotid stenosis is based on velocity parameters that correlate the residual internal carotid diameter with NASCET-based stenosis levels, using the diameter of the distal internal carotid lumen as the denominator for stenosis measurement. The following velocity measurements were obtained: RIGHT ICA:  105 cm/sec CCA:  92 cm/sec SYSTOLIC ICA/CCA RATIO:  1.1 DIASTOLIC ICA/CCA RATIO:  1.5 ECA:  74 cm/sec LEFT ICA:  70 cm/sec CCA:  106 cm/sec SYSTOLIC ICA/CCA RATIO:  0.7 DIASTOLIC ICA/CCA  RATIO:  0.7 ECA:  100 cm/sec RIGHT CAROTID ARTERY: Minimal plaque at the carotid bulb and proximal internal carotid artery. Normal waveforms and velocities in the internal carotid artery. External carotid artery is patent with normal waveform. RIGHT VERTEBRAL ARTERY: Antegrade flow and normal waveform in the right vertebral artery. LEFT CAROTID ARTERY: Left carotid arteries are patent without significant plaque. Normal waveforms and  velocities in the internal carotid artery. LEFT VERTEBRAL ARTERY: Antegrade flow and normal waveform in the left vertebral artery. IMPRESSION: Minimal atherosclerotic disease in the carotid arteries. No significant carotid artery stenosis. Estimated degree of stenosis in the internal carotid arteries is less than 50% bilaterally. Patent vertebral arteries. Electronically Signed   By: Richarda Overlie M.D.   On: 06/19/2016 09:37

## 2016-07-17 ENCOUNTER — Other Ambulatory Visit: Payer: Self-pay | Admitting: Family Medicine

## 2016-07-17 MED FILL — VALSARTAN-HCTZ 320-25 MG TA: 320-25 | 90 days supply | Qty: 90 | Fill #0

## 2016-07-21 ENCOUNTER — Ambulatory Visit (HOSPITAL_COMMUNITY)
Admission: RE | Admit: 2016-07-21 | Discharge: 2016-07-21 | Disposition: A | Discharge status: Home / Self Care | Payer: 59 | Source: Ambulatory Visit | Attending: Family Medicine | Admitting: Family Medicine

## 2016-07-21 DIAGNOSIS — Z1231 Encounter for screening mammogram for malignant neoplasm of breast: Secondary | ICD-10-CM | POA: Insufficient documentation

## 2016-08-03 MED FILL — DILTIAZEM HCL ER 300 MG CAP: 300 | 90 days supply | Qty: 90 | Fill #1

## 2016-08-04 ENCOUNTER — Telehealth: Payer: Self-pay | Admitting: *Deleted

## 2016-08-04 ENCOUNTER — Ambulatory Visit (HOSPITAL_COMMUNITY)
Admission: RE | Admit: 2016-08-04 | Discharge: 2016-08-04 | Disposition: A | Discharge status: Home / Self Care | Payer: 59 | Source: Ambulatory Visit | Attending: Adult Health | Admitting: Adult Health

## 2016-08-04 DIAGNOSIS — I081 Rheumatic disorders of both mitral and tricuspid valves: Secondary | ICD-10-CM | POA: Diagnosis not present

## 2016-08-04 DIAGNOSIS — I10 Essential (primary) hypertension: Secondary | ICD-10-CM | POA: Diagnosis not present

## 2016-08-04 LAB — ECHOCARDIOGRAM COMPLETE
AO mean calculated velocity dopler: 93.3 cm/s
AOPV: 0.84 m/s
AOVTI: 27.8 cm
AV Area VTI index: 0.99 cm2/m2
AV Area VTI: 1.91 cm2
AV Area mean vel: 1.98 cm2
AV Mean grad: 4 mmHg
AV area mean vel ind: 1.03 cm2/m2
AV peak Index: 0.99
AVA: 1.91 cm2
AVCELMEANRAT: 0.87
AVPG: 11 mmHg
AVPKVEL: 163 cm/s
CHL CUP AV VEL: 1.91
E decel time: 246 msec
E/e' ratio: 10.26
FS: 53 % — AB (ref 28–44)
IVS/LV PW RATIO, ED: 1.08
LA ID, A-P, ES: 37 mm
LA diam end sys: 37 mm
LA vol index: 17.4 mL/m2
LADIAMINDEX: 1.92 cm/m2
LAVOL: 33.4 mL
LAVOLA4C: 38.7 mL
LDCA: 2.27 cm2
LV E/e' medial: 10.26
LV SIMPSON'S DISK: 66
LV TDI E'MEDIAL: 4.46
LV e' LATERAL: 5.87 cm/s
LV sys vol index: 10 mL/m2
LVDIAVOL: 58 mL (ref 46–106)
LVDIAVOLIN: 30 mL/m2
LVEEAVG: 10.26
LVOT SV: 53 mL
LVOT VTI: 23.4 cm
LVOT diameter: 17 mm
LVOT peak VTI: 0.84 cm
LVOT peak grad rest: 8 mmHg
LVOTPV: 137 cm/s
LVSYSVOL: 20 mL (ref 14–42)
MV Dec: 246
MV pk E vel: 60.2 m/s
MVPKAVEL: 101 m/s
PW: 11.8 mm — AB (ref 0.6–1.1)
RV LATERAL S' VELOCITY: 17 cm/s
RV TAPSE: 18.3 mm
Stroke v: 38 ml
TDI e' lateral: 5.87
Valve area index: 0.99

## 2016-08-04 NOTE — Telephone Encounter (Signed)
-----   Message from Lendon Colonel, NP sent at 08/04/2016  9:28 AM EDT ----- Reviewed echo. Essentially normal. No changes in medical regimen

## 2016-08-04 NOTE — Progress Notes (Signed)
*  PRELIMINARY RESULTS* Echocardiogram 2D Echocardiogram has been performed.  Kristin Hall 08/04/2016, 8:59 AM

## 2016-08-04 NOTE — Telephone Encounter (Signed)
Called patient with test results. No answer. Left message to call back.  

## 2016-08-14 ENCOUNTER — Telehealth: Payer: Self-pay | Admitting: Gastroenterology

## 2016-08-14 NOTE — Telephone Encounter (Signed)
Recall for tcs °

## 2016-08-15 NOTE — Telephone Encounter (Signed)
Letter mailed to pt.  

## 2016-08-17 MED FILL — POLY-IRON 150 MG CAPSULE: 150 | 30 days supply | Qty: 30 | Fill #4

## 2016-09-26 ENCOUNTER — Telehealth (HOSPITAL_COMMUNITY): Payer: Self-pay | Admitting: *Deleted

## 2016-09-26 ENCOUNTER — Other Ambulatory Visit (HOSPITAL_COMMUNITY): Payer: Self-pay | Admitting: *Deleted

## 2016-09-26 NOTE — Telephone Encounter (Signed)
Doris:   We can triage patient. I have reviewed with Dr. Oneida Alar. Please cancel office visit with me, and let's just triage.

## 2016-09-26 NOTE — Telephone Encounter (Signed)
Opened in error

## 2016-09-27 ENCOUNTER — Other Ambulatory Visit (HOSPITAL_COMMUNITY): Payer: Self-pay | Admitting: Oncology

## 2016-09-27 DIAGNOSIS — E611 Iron deficiency: Secondary | ICD-10-CM

## 2016-09-27 MED ORDER — POLYSACCHARIDE IRON COMPLEX 150 MG PO CAPS: 150.0000 mg | ORAL_CAPSULE | Freq: Every day | ORAL | 3 refills | Status: DC

## 2016-09-27 MED FILL — POLY-IRON 150 MG CAPSULE: 150 | 90 days supply | Qty: 90 | Fill #0

## 2016-10-02 ENCOUNTER — Encounter: Payer: Self-pay | Admitting: Family Medicine

## 2016-10-02 ENCOUNTER — Ambulatory Visit (INDEPENDENT_AMBULATORY_CARE_PROVIDER_SITE_OTHER): Payer: 59 | Admitting: Family Medicine

## 2016-10-02 VITALS — BP 122/82 | HR 85 | Resp 16 | Ht 63.0 in | Wt 179.0 lb

## 2016-10-02 DIAGNOSIS — I10 Essential (primary) hypertension: Secondary | ICD-10-CM | POA: Diagnosis not present

## 2016-10-02 DIAGNOSIS — Z23 Encounter for immunization: Secondary | ICD-10-CM

## 2016-10-02 DIAGNOSIS — Z0000 Encounter for general adult medical examination without abnormal findings: Secondary | ICD-10-CM | POA: Diagnosis not present

## 2016-10-02 DIAGNOSIS — E785 Hyperlipidemia, unspecified: Secondary | ICD-10-CM

## 2016-10-02 DIAGNOSIS — Z1211 Encounter for screening for malignant neoplasm of colon: Secondary | ICD-10-CM

## 2016-10-02 LAB — POC HEMOCCULT BLD/STL (OFFICE/1-CARD/DIAGNOSTIC): Fecal Occult Blood, POC: NEGATIVE

## 2016-10-02 MED ORDER — VYTORIN 10-10 MG PO TABS: ORAL_TABLET | ORAL | 5 refills | Status: DC

## 2016-10-02 MED FILL — EZETIMIBE-SIMVASTATIN 10-10: 10-10 | 30 days supply | Qty: 30 | Fill #0

## 2016-10-02 NOTE — Assessment & Plan Note (Signed)
After obtaining informed consent, the vaccine is  administered by LPN.  

## 2016-10-02 NOTE — Assessment & Plan Note (Signed)

## 2016-10-02 NOTE — Patient Instructions (Signed)
F/u in 5 month, call if you need me before  TdAP today  Please get fasting labs this Saturday, lipid, hepatic , TSH and Vitamin D  It is important that you exercise regularly at least 30 minutes 5 times a week. If you develop chest pain, have severe difficulty breathing, or feel very tired, stop exercising immediately and seek medical attention    50 % of intake needs to be vegetable and fruit  Please schedule colonscopy  Thank you  for choosing [facility]. We consider it a privelige to serve you.  Delivering excellent health care in a caring and  compassionate way is our goal.  Partnering with you,  so that together we can achieve this goal is our strategy.

## 2016-10-03 NOTE — Progress Notes (Signed)
Kristin Hall     MRN: [id number]      DOB: 04/27/1956  HPI: Patient is in for annual physical exam. No other health concerns are expressed or addressed at the visit. . Immunization is reviewed , and  updated if needed.   PE: BP 122/82   Pulse 85   Resp 16   Ht 5\' 3"  (1.6 m)   Wt 179 lb (81.2 kg)   SpO2 96%   BMI 31.71 kg/m   Pleasant  female, alert and oriented x 3, in no cardio-pulmonary distress. Afebrile. HEENT No facial trauma or asymetry. Sinuses non tender.  Extra occullar muscles intact,. External ears normal, tympanic membranes clear. Oropharynx moist, no exudate. Neck: supple, no adenopathy,JVD or thyromegaly.No bruits.  Chest: Clear to ascultation bilaterally.No crackles or wheezes. Non tender to palpation  Breast: No asymetry,no masses or lumps. No tenderness. No nipple discharge or inversion. No axillary or supraclavicular adenopathy  Cardiovascular system; Heart sounds normal,  S1 and  S2 ,no S3.  No murmur, or thrill. Apical beat not displaced Peripheral pulses normal.  Abdomen: Soft, non tender, no organomegaly or masses. No bruits. Bowel sounds normal. No guarding, tenderness or rebound.  Rectal:  Normal sphincter tone. No rectal mass. Guaiac negative stool.  GU: Not examined.   Musculoskeletal exam: Full ROM of spine, hips , shoulders and knees. No deformity ,swelling or crepitus noted. No muscle wasting or atrophy.   Neurologic: Cranial nerves 2 to 12 intact. Power, tone ,sensation and reflexes normal throughout. No disturbance in gait. No tremor.  Skin: Intact, no ulceration, erythema , scaling or rash noted. Pigmentation normal throughout  Psych; Normal mood and affect. Judgement and concentration normal   Assessment & Plan:  Annual physical exam Annual exam as documented. Counseling done  re healthy lifestyle involving commitment to 150 minutes exercise per week, heart healthy diet, and attaining healthy  weight.The importance of adequate sleep also discussed. Regular seat belt use and home safety, is also discussed. Changes in health habits are decided on by the patient with goals and time frames  set for achieving them. Immunization and cancer screening needs are specifically addressed at this visit.   Need for Tdap vaccination After obtaining informed consent, the vaccine is  administered by LPN.

## 2016-10-03 NOTE — Telephone Encounter (Signed)
Called, [facility] for a return call. ( appt already cancelled).

## 2016-10-04 ENCOUNTER — Ambulatory Visit: Payer: 59 | Admitting: Gastroenterology

## 2016-10-10 NOTE — Telephone Encounter (Signed)
LMOM for a return call. Mailing a letter to call also.  

## 2016-10-11 ENCOUNTER — Other Ambulatory Visit: Payer: Self-pay

## 2016-10-11 ENCOUNTER — Telehealth: Payer: Self-pay

## 2016-10-11 DIAGNOSIS — Z1211 Encounter for screening for malignant neoplasm of colon: Secondary | ICD-10-CM

## 2016-10-11 MED ORDER — PEG-KCL-NACL-NASULF-NA ASC-C 100 G PO SOLR: 1.0000 | ORAL | 0 refills | Status: DC

## 2016-10-11 MED FILL — MOVIPREP POWDER KIT: 100 | 1 days supply | Qty: 1 | Fill #0

## 2016-10-11 NOTE — Telephone Encounter (Signed)
Gastroenterology Pre-Procedure Review  Request Date: 10/11/2016 Requesting Physician:   PATIENT REVIEW QUESTIONS: The patient responded to the following health history questions as indicated:    1. Diabetes Melitis: no 2. Joint replacements in the past 12 months: no 3. Major health problems in the past 3 months: no 4. Has an artificial valve or MVP: no 5. Has a defibrillator: no 6. Has been advised in past to take antibiotics in advance of a procedure like teeth cleaning: no 7. Family history of colon cancer: no  8. Alcohol Use: no 9. History of sleep apnea: no  10. History of coronary artery or other vascular stents placed within the last 12 months: no    MEDICATIONS & ALLERGIES:    Patient reports the following regarding taking any blood thinners:   Plavix? no Aspirin? no Coumadin? no Brilinta? no Xarelto? no Eliquis? no Pradaxa? no Savaysa? no Effient? no  Patient confirms/reports the following medications:  Current Outpatient Prescriptions  Medication Sig Dispense Refill  . acetaminophen (TYLENOL) 500 MG tablet Take 1 tablet (500 mg total) by mouth every 8 (eight) hours as needed. 30 tablet 0  . diltiazem (TIAZAC) 300 MG 24 hr capsule TAKE 1 CAPSULE BY MOUTH ONCE DAILY 90 capsule 1  . iron polysaccharides (POLY-IRON 150) 150 MG capsule Take 1 capsule (150 mg total) by mouth daily. 90 capsule 3  . Multiple Vitamins-Minerals (ALIVE WOMENS 50+) TABS Take 1 tablet by mouth daily.    . pantoprazole (PROTONIX) 40 MG tablet TAKE 1 TABLET BY MOUTH DAILY. 90 tablet PRN  . potassium chloride SA (K-DUR,KLOR-CON) 20 MEQ tablet Take 2 tablets (40 mEq total) by mouth daily. 180 tablet 3  . valsartan-hydrochlorothiazide (DIOVAN-HCT) 320-25 MG tablet TAKE 1 TABLET BY MOUTH ONCE DAILY 90 tablet 1  . VYTORIN 10-10 MG tablet TAKE 1 TABLET BY MOUTH AT BEDTIME FOR CHOLESTEROL. 30 tablet 5   No current facility-administered medications for this visit.     Patient confirms/reports the  following allergies:  Allergies  Allergen Reactions  . Amlodipine Other (See Comments)    Chest pain  . Aspirin Other (See Comments)    Generalized aches  . Metronidazole Hives and Itching  . Valsartan Cough    No orders of the defined types were placed in this encounter.   AUTHORIZATION INFORMATION Primary Insurance:   ID #:   Group #:  Pre-Cert / Auth required:  Pre-Cert / Auth #:   Secondary Insurance:   ID #:  Group #:  Pre-Cert / Auth required:  Pre-Cert / Auth #:   SCHEDULE INFORMATION: Procedure has been scheduled as follows:  Date:  12/07/2016           Time: 8:30 AM Location: [facility] Short Stay  This Gastroenterology Pre-Precedure Review Form is being routed to the following provider(s): Barney Drain, MD

## 2016-10-11 NOTE — Telephone Encounter (Signed)
Appropriate. Hold iron X 7 days prior.

## 2016-10-11 NOTE — Telephone Encounter (Signed)
Rx sent to the pharmacy and instructions mailed to pt.  

## 2016-10-17 MED FILL — POTASSIUM CL ER 20 MEQ TABL: 20 | 90 days supply | Qty: 180 | Fill #1 | Status: TO

## 2016-10-26 NOTE — Telephone Encounter (Signed)
NO PA is needed 

## 2016-10-31 MED FILL — VALSARTAN-HCTZ 320-25 MG TA: 320-25 | 90 days supply | Qty: 90 | Fill #1

## 2016-10-31 MED FILL — PANTOPRAZOLE SOD DR 40 MG T: 40 | 90 days supply | Qty: 90 | Fill #3

## 2016-11-06 ENCOUNTER — Other Ambulatory Visit: Payer: Self-pay | Admitting: Family Medicine

## 2016-11-06 MED FILL — DILTIAZEM HCL ER 300 MG CAP: 300 | 90 days supply | Qty: 90 | Fill #0

## 2016-11-24 DIAGNOSIS — I10 Essential (primary) hypertension: Secondary | ICD-10-CM | POA: Diagnosis not present

## 2016-11-24 DIAGNOSIS — E785 Hyperlipidemia, unspecified: Secondary | ICD-10-CM | POA: Diagnosis not present

## 2016-11-24 LAB — LIPID PANEL
CHOL/HDL RATIO: 3.8 ratio (ref <5.0)
CHOLESTEROL: 151 mg/dL (ref <200)
HDL: 40 mg/dL — ABNORMAL LOW (ref >50)
LDL Cholesterol: 93 mg/dL (ref <100)
TRIGLYCERIDES: 89 mg/dL (ref <150)
VLDL: 18 mg/dL (ref <30)

## 2016-11-24 LAB — HEPATIC FUNCTION PANEL
ALT: 11 U/L (ref 6–29)
AST: 16 U/L (ref 10–35)
Albumin: 4.1 g/dL (ref 3.6–5.1)
Alkaline Phosphatase: 139 U/L — ABNORMAL HIGH (ref 33–130)
Bilirubin, Direct: 0.1 mg/dL (ref <=0.2)
Indirect Bilirubin: 0.4 mg/dL (ref 0.2–1.2)
Total Bilirubin: 0.5 mg/dL (ref 0.2–1.2)
Total Protein: 7.1 g/dL (ref 6.1–8.1)

## 2016-11-24 LAB — TSH: TSH: 1.31 m[IU]/L

## 2016-11-25 LAB — VITAMIN D 25 HYDROXY (VIT D DEFICIENCY, FRACTURES): VIT D 25 HYDROXY: 36 ng/mL (ref 30–100)

## 2016-11-26 ENCOUNTER — Encounter: Payer: Self-pay | Admitting: Family Medicine

## 2016-12-07 ENCOUNTER — Ambulatory Visit (HOSPITAL_COMMUNITY)
Admission: RE | Admit: 2016-12-07 | Discharge: 2016-12-07 | Disposition: A | Discharge status: Home / Self Care | Payer: 59 | Source: Ambulatory Visit | Attending: Gastroenterology | Admitting: Gastroenterology

## 2016-12-07 ENCOUNTER — Encounter (HOSPITAL_COMMUNITY)
Admission: RE | Disposition: A | Discharge status: Home / Self Care | Payer: Self-pay | Source: Ambulatory Visit | Attending: Gastroenterology

## 2016-12-07 ENCOUNTER — Encounter (HOSPITAL_COMMUNITY): Payer: Self-pay

## 2016-12-07 DIAGNOSIS — Z801 Family history of malignant neoplasm of trachea, bronchus and lung: Secondary | ICD-10-CM | POA: Insufficient documentation

## 2016-12-07 DIAGNOSIS — Z803 Family history of malignant neoplasm of breast: Secondary | ICD-10-CM | POA: Diagnosis not present

## 2016-12-07 DIAGNOSIS — D473 Essential (hemorrhagic) thrombocythemia: Secondary | ICD-10-CM | POA: Diagnosis not present

## 2016-12-07 DIAGNOSIS — K644 Residual hemorrhoidal skin tags: Secondary | ICD-10-CM | POA: Insufficient documentation

## 2016-12-07 DIAGNOSIS — Z7982 Long term (current) use of aspirin: Secondary | ICD-10-CM | POA: Insufficient documentation

## 2016-12-07 DIAGNOSIS — Z820 Family history of epilepsy and other diseases of the nervous system: Secondary | ICD-10-CM | POA: Insufficient documentation

## 2016-12-07 DIAGNOSIS — K648 Other hemorrhoids: Secondary | ICD-10-CM | POA: Diagnosis not present

## 2016-12-07 DIAGNOSIS — E785 Hyperlipidemia, unspecified: Secondary | ICD-10-CM | POA: Diagnosis not present

## 2016-12-07 DIAGNOSIS — Z1212 Encounter for screening for malignant neoplasm of rectum: Secondary | ICD-10-CM | POA: Diagnosis not present

## 2016-12-07 DIAGNOSIS — Z79899 Other long term (current) drug therapy: Secondary | ICD-10-CM | POA: Insufficient documentation

## 2016-12-07 DIAGNOSIS — I10 Essential (primary) hypertension: Secondary | ICD-10-CM | POA: Diagnosis not present

## 2016-12-07 DIAGNOSIS — D125 Benign neoplasm of sigmoid colon: Secondary | ICD-10-CM | POA: Insufficient documentation

## 2016-12-07 DIAGNOSIS — Z886 Allergy status to analgesic agent status: Secondary | ICD-10-CM | POA: Diagnosis not present

## 2016-12-07 DIAGNOSIS — Z1211 Encounter for screening for malignant neoplasm of colon: Secondary | ICD-10-CM | POA: Diagnosis not present

## 2016-12-07 DIAGNOSIS — D120 Benign neoplasm of cecum: Secondary | ICD-10-CM | POA: Diagnosis not present

## 2016-12-07 DIAGNOSIS — Z888 Allergy status to other drugs, medicaments and biological substances status: Secondary | ICD-10-CM | POA: Diagnosis not present

## 2016-12-07 DIAGNOSIS — Z90710 Acquired absence of both cervix and uterus: Secondary | ICD-10-CM | POA: Insufficient documentation

## 2016-12-07 DIAGNOSIS — R51 Headache: Secondary | ICD-10-CM | POA: Insufficient documentation

## 2016-12-07 HISTORY — PX: COLONOSCOPY: SHX5424

## 2016-12-07 HISTORY — PX: POLYPECTOMY: SHX5525

## 2016-12-07 SURGERY — COLONOSCOPY
Anesthesia: Moderate Sedation

## 2016-12-07 MED ORDER — SODIUM CHLORIDE 0.9 % IJ SOLN
INTRAMUSCULAR | Status: DC | PRN
  Administered 2016-12-07: 3 mL

## 2016-12-07 MED ORDER — MEPERIDINE HCL 100 MG/ML IJ SOLN
INTRAMUSCULAR | Status: DC | PRN
  Administered 2016-12-07 (×4): 25 mg via INTRAVENOUS

## 2016-12-07 MED ORDER — EPINEPHRINE PF 1 MG/10ML IJ SOSY
PREFILLED_SYRINGE | INTRAMUSCULAR | Status: AC
  Filled 2016-12-07: qty 10

## 2016-12-07 MED ORDER — SODIUM CHLORIDE 0.9 % IJ SOLN
INTRAMUSCULAR | Status: DC | PRN
  Administered 2016-12-07: 3 mL via INTRAVENOUS

## 2016-12-07 MED ORDER — MIDAZOLAM HCL 5 MG/5ML IJ SOLN
INTRAMUSCULAR | Status: DC | PRN
  Administered 2016-12-07: 1 mg via INTRAVENOUS
  Administered 2016-12-07: 2 mg via INTRAVENOUS
  Administered 2016-12-07: 1 mg via INTRAVENOUS
  Administered 2016-12-07: 2 mg via INTRAVENOUS
  Administered 2016-12-07: 1 mg via INTRAVENOUS

## 2016-12-07 MED ORDER — SODIUM CHLORIDE 0.9 % IV SOLN
INTRAVENOUS | Status: DC
  Administered 2016-12-07: 1000 mL via INTRAVENOUS

## 2016-12-07 MED ORDER — MEPERIDINE HCL 100 MG/ML IJ SOLN
INTRAMUSCULAR | Status: AC
  Filled 2016-12-07: qty 2

## 2016-12-07 MED ORDER — SODIUM CHLORIDE 0.9% FLUSH
INTRAVENOUS | Status: AC
  Filled 2016-12-07: qty 10

## 2016-12-07 MED ORDER — MIDAZOLAM HCL 5 MG/5ML IJ SOLN
INTRAMUSCULAR | Status: AC
  Filled 2016-12-07: qty 10

## 2016-12-07 MED ORDER — SPOT INK MARKER SYRINGE KIT
PACK | SUBMUCOSAL | Status: AC
  Filled 2016-12-07: qty 5

## 2016-12-07 NOTE — Op Note (Addendum)
[facility] Patient Name: Kristin Hall Procedure Date: 12/07/2016 7:08 AM MRN: [id number] Date of Birth: [birth year] Attending MD: Jonette Eva , MD CSN: [id number] Age: 61 Admit Type: Outpatient Procedure:                Colonoscopy WITH EMR & COLD SNARE POLYPECTOMY WITH                            SPOT/EPI INJECTION &CLIPS x4 Indications:              Screening for colorectal malignant neoplasm Providers:                Jonette Eva, MD, Nena Polio, RN, Dyann Ruddle Referring MD:             Milus Mallick. Simpson MD, MD Medicines:                Meperidine 100 mg IV, Midazolam 7 mg IV Complications:            No immediate complications. Estimated Blood Loss:     Estimated blood loss was minimal. Procedure:                Pre-Anesthesia Assessment:                           - Prior to the procedure, a History and Physical                            was performed, and patient medications and                            allergies were reviewed. The patient's tolerance of                            previous anesthesia was also reviewed. The risks                            and benefits of the procedure and the sedation                            options and risks were discussed with the patient.                            All questions were answered, and informed consent                            was obtained. Prior Anticoagulants: The patient has                            taken naproxen, last dose was 1 day prior to                            procedure. After reviewing the risks and benefits,                            the patient was deemed in satisfactory condition to  undergo the procedure. After obtaining informed                            consent, the colonoscope was passed under direct                            vision. Throughout the procedure, the patient's                            blood pressure, pulse, and oxygen saturations were                monitored continuously. The EC-[id number] ([id number])                            scope was introduced through the anus and advanced                            to the the cecum, identified by appendiceal orifice                            and ileocecal valve. After obtaining informed                            consent, the colonoscope was passed under direct                            vision. Throughout the procedure, the patient's                            blood pressure, pulse, and oxygen saturations were                            monitored continuously.The upper GI endoscopy was                            technically difficult and complex due to                            significant looping. Successful completion of the                            procedure was aided by increasing the dose of                            sedation medication, straightening and shortening                            the scope to obtain bowel loop reduction and                            COLOWRAP. The patient tolerated the procedure                            fairly well. The quality of the bowel preparation  was good. The ileocecal valve, appendiceal orifice,                            and rectum were photographed. Scope In: 8:58:36 AM Scope Out: 9:51:26 AM Scope Withdrawal Time: 0 hours 47 minutes 59 seconds  Total Procedure Duration: 0 hours 52 minutes 50 seconds  Findings:      A 15 mm polyp was found in the cecum. The polyp was carpet-like.       ENDOSCOPIC MUCOSAL RESECTION WAS PERFORMED. Area was successfully       injected with 3 mL saline for a lift polypectomy. The polyp was removed       with a hot snare. Resection and retrieval were complete. The edge and       center of polypectomy had Coagulation for destruction of remaining       portion of lesion using argon plasma at 0.3 liters/minute and 20 watts       was successful. To prevent bleeding after the  polypectomy, three       hemostatic clips were successfully placed (MR conditional). There was no       bleeding at the end of the procedure.      A 30 mm polyp was found in the sigmoid colon. The polyp was       pedunculated. 1 CC SPOT INJECTED. Area was successfully injected with 3       mL of a 1:10,000 solution of epinephrine for hemostasis. The polyp was       removed with a cold snare DUE TO TECHNICAL DIFFICULTIES. Resection and       retrieval were complete VIA RETREIVAL NET. To prevent bleeding       post-intervention, one hemostatic clip was successfully placed (MR       conditional). There was no bleeding at the end of the procedure.      External and internal hemorrhoids were found during retroflexion. The       hemorrhoids were small. Impression:               - One 15 mm polyp in the cecum, removed with a hot                            snare. Resected and retrieved. Injected. Treated                            with argon plasma coagulation (APC). Clips (MR                            conditional) were placed.                           - One 30 mm polyp in the sigmoid colon, removed                            with a cold snare. Resected and retrieved. Clip (MR                            conditional) was placed. Injected. Tattooed.                           -  External and internal hemorrhoids. Moderate Sedation:      Moderate (conscious) sedation was administered by the endoscopy nurse       and supervised by the endoscopist. The following parameters were       monitored: oxygen saturation, heart rate, blood pressure, and response       to care. Total physician intraservice time was 64 minutes. Recommendation:           - Repeat colonoscopy 1-3 YEARS for surveillance.                            ALLFIRST DEGREE RELATIVES NEED A TCS AT AGE 59.                           - High fiber diet.                           - Continue present medications.                           - Await  pathology results. NO ELECTIVE MRI FOR 30                            DAYS DUE TO METAL CLIPS IN COLON.                           - Patient has a contact number available for                            emergencies. The signs and symptoms of potential                            delayed complications were discussed with the                            patient. Return to normal activities tomorrow.                            Written discharge instructions were provided to the                            patient. Procedure Code(s):        --- Professional ---                           [phone], Colonoscopy, flexible; with removal of                            tumor(s), polyp(s), or other lesion(s) by snare                            technique                           45381, Colonoscopy, flexible; with directed  submucosal injection(s), any substance                           99152, Moderate sedation services provided by the                            same physician or other qualified health care                            professional performing the diagnostic or                            therapeutic service that the sedation supports,                            requiring the presence of an independent trained                            observer to assist in the monitoring of the                            patient's level of consciousness and physiological                            status; initial 15 minutes of intraservice time,                            patient age 45 years or older                           [phone], Moderate sedation services; each additional                            15 minutes intraservice time                           99153, Moderate sedation services; each additional                            15 minutes intraservice time                           99153, Moderate sedation services; each additional                            15 minutes  intraservice time Diagnosis Code(s):        --- Professional ---                           Z12.11, Encounter for screening for malignant                            neoplasm of colon                           D12.0, Benign neoplasm of cecum  D12.5, Benign neoplasm of sigmoid colon                           K64.8, Other hemorrhoids CPT copyright 2016 [employer]. All rights reserved. The codes documented in this report are preliminary and upon coder review may  be revised to meet current compliance requirements. Jonette Eva, MD Jonette Eva, MD 12/07/2016 10:12:34 AM This report has been signed electronically. Number of Addenda: 0

## 2016-12-07 NOTE — H&P (Signed)
Primary Care Physician:  Fayrene Helper, MD Primary Gastroenterologist:  Dr. Oneida Alar  Pre-Procedure History & Physical: HPI:  Kristin Hall is a 61 y.o. female here for [place].  Past Medical History:  Diagnosis Date  . Elevated alkaline phosphatase level   . Headache   . Hyperlipidemia   . Hypertension   . Thrombocytosis ([place]) 07/23/2015    Past Surgical History:  Procedure Laterality Date  . ABDOMINAL HYSTERECTOMY     partial   . COLONOSCOPY  10/04/2006   SLF: Normal retroflexed view of the rectum/Normal colon without evidence of polyps, masses, inflammatory changes, diverticula or arteriovenous malformations    Prior to Admission medications   Medication Sig Start Date End Date Taking? Authorizing Provider  acetaminophen (TYLENOL) 500 MG tablet Take 1 tablet (500 mg total) by mouth every 8 (eight) hours as needed. 03/02/16  Yes Fayrene Helper, MD  diltiazem (TIAZAC) 300 MG 24 hr capsule TAKE 1 CAPSULE BY MOUTH ONCE DAILY 11/06/16  Yes Fayrene Helper, MD  pantoprazole (PROTONIX) 40 MG tablet TAKE 1 TABLET BY MOUTH DAILY. 12/28/15  Yes Fayrene Helper, MD  peg 3350 powder (MOVIPREP) 100 g SOLR Take 1 kit (200 g total) by mouth as directed. 10/11/16  Yes Zakaria Fromer L, MD  potassium chloride SA (K-DUR,KLOR-CON) 20 MEQ tablet Take 2 tablets (40 mEq total) by mouth daily. 07/11/16  Yes Herminio Commons, MD  valsartan-hydrochlorothiazide (DIOVAN-HCT) 320-25 MG tablet TAKE 1 TABLET BY MOUTH ONCE DAILY 07/17/16  Yes Fayrene Helper, MD  VYTORIN 10-10 MG tablet TAKE 1 TABLET BY MOUTH AT BEDTIME FOR CHOLESTEROL. 10/02/16  Yes Fayrene Helper, MD  iron polysaccharides (POLY-IRON 150) 150 MG capsule Take 1 capsule (150 mg total) by mouth daily. 09/27/16   Baird Cancer, PA-C  Multiple Vitamins-Minerals (ALIVE WOMENS 50+) TABS Take 1 tablet by mouth daily.    [provider]    Allergies as of 10/11/2016 - Review Complete 10/11/2016   Allergen Reaction Noted  . Amlodipine Other (See Comments) 06/07/2015  . Aspirin Other (See Comments) 09/22/2013  . Metronidazole Hives and Itching 11/12/2007  . Valsartan Cough 04/19/2015    Family History  Problem Relation Age of Onset  . Cancer Mother        Breast  . Dementia Mother   . Cancer Father        Lung Cancer, deceased at 73  . Colon cancer Neg Hx   . Liver disease Neg Hx     Social History   Social History  . Marital status: Widowed    Spouse name: N/A  . Number of children: N/A  . Years of education: N/A   Occupational History  . Not on file.   Social History Main Topics  . Smoking status: Never Smoker  . Smokeless tobacco: Never Used  . Alcohol use No  . Drug use: No  . Sexual activity: Yes   Other Topics Concern  . Not on file   Social History Narrative  . No narrative on file    Review of Systems: See HPI, otherwise negative ROS   Physical Exam: BP (!) 154/90   Pulse (!) 103   Temp 97.7 F (36.5 C) (Oral)   Resp 16   SpO2 99%  General:   Alert,  pleasant and cooperative in NAD Head:  Normocephalic and atraumatic. Neck:  Supple; Lungs:  Clear throughout to auscultation.    Heart:  Regular rate and rhythm. Abdomen:  Soft,  nontender and nondistended. Normal bowel sounds, without guarding, and without rebound.   Neurologic:  Alert and  oriented x4;  grossly normal neurologically.  Impression/Plan:     SCREENING  Plan:  1. TCS TODAY. DISCUSSED PROCEDURE, BENEFITS, & RISKS: < 1% chance of medication reaction, bleeding, perforation, or rupture of spleen/liver.

## 2016-12-07 NOTE — Discharge Instructions (Signed)
You have small internal AND LARGE EXTERNAL HEMORRHOIDS. YOU HAD Two POLYPS REMOVED. I PLACED FOUR CLIPS TO PREVENT BLEEDING IN 7-10 DAYS.    NO ELECTIVE MRI FOR 30 DAYS DUE TO METAL CLIPs THE COLON.  DRINK WATER TO KEEP YOUR URINE LIGHT YELLOW.  FOLLOW A HIGH FIBER DIET. AVOID ITEMS THAT CAUSE BLOATING. See info below.YOU SHOULD TRANSITION TO A PLANT BASED DIET-NO MEAT OR DAIRY for 6 MOS. I RECOMMEND THE BOOK, "PREVENT AND REVERSE HEART DISEASE", CALDWELL ESSELSTYN JR., MD. PAGES 120-121 [place] THE DIET AND THE LAST HALF OF THE BOOK HAS QUICK AND EASY RECIPES FOR BREAKFAST, LUNCH, AND DINNER ARE AFTER PAGE 127.   CONTINUE YOUR WEIGHT LOSS EFFORTS.  WHILE I DO NOT WANT TO ALARM YOU, YOUR BODY MASS INDEX (BMI) IS OVER 30 WHICH MEANS YOU ARE OBESE. OBESITY ACTIVATES CANCER GENES. OBESITY IS ASSOCIATED WITH AN INCREASE RISK FOR ALL CANCERS, INCLUDING ESOPHAGEAL AND COLON CANCER. A WEIGHT OF   WILL GET YOUR BMI UNDER 30.  YOUR BIOPSY RESULTS WILL BE AVAILABLE IN MY CHART AFTER JUL 29 AND MY OFFICE WILL CONTACT YOU IN 10-14 DAYS WITH YOUR RESULTS.   Next colonoscopy in 1-3 years. YOUR SISTERS, BROTHERS, CHILDREN, AND PARENTS NEED TO HAVE A COLONOSCOPY STARTING AT THE AGE OF 40.    Colonoscopy Care After Read the instructions outlined below and refer to this sheet in the next week. These discharge instructions provide you with general information on caring for yourself after you leave the hospital. While your treatment has been planned according to the most current medical practices available, unavoidable complications occasionally occur. If you have any problems or questions after discharge, call DR. Kacia Halley, [phone].  ACTIVITY  You may resume your regular activity, but move at a slower pace for the next 24 hours.   Take frequent rest periods for the next 24 hours.   Walking will help get rid of the air and reduce the bloated feeling in your belly (abdomen).   No  driving for 24 hours (because of the medicine (anesthesia) used during the test).   You may shower.   Do not sign any important legal documents or operate any machinery for 24 hours (because of the anesthesia used during the test).    NUTRITION  Drink plenty of fluids.   You may resume your normal diet as instructed by your doctor.   Begin with a light meal and progress to your normal diet. Heavy or fried foods are harder to digest and may make you feel sick to your stomach (nauseated).   Avoid alcoholic beverages for 24 hours or as instructed.    MEDICATIONS  You may resume your normal medications.   WHAT YOU CAN EXPECT TODAY  Some feelings of bloating in the abdomen.   Passage of more gas than usual.   Spotting of blood in your stool or on the toilet paper  .  IF YOU HAD POLYPS REMOVED DURING THE COLONOSCOPY:  Eat a soft diet IF YOU HAVE NAUSEA, BLOATING, ABDOMINAL PAIN, OR VOMITING.    FINDING OUT THE RESULTS OF YOUR TEST Not all test results are available during your visit. DR. Oneida Alar WILL CALL YOU WITHIN 14 DAYS OF YOUR PROCEDUE WITH YOUR RESULTS. Do not assume everything is normal if you have not heard from DR. Aydin Cavalieri, CALL HER OFFICE AT [phone].  SEEK IMMEDIATE MEDICAL ATTENTION AND CALL THE OFFICE: [phone] IF:  You have more than a spotting of blood in your stool.  Your belly is swollen (abdominal distention).   You are nauseated or vomiting.   You have a temperature over 101F.   You have abdominal pain or discomfort that is severe or gets worse throughout the day.  High-Fiber Diet A high-fiber diet changes your normal diet to include more whole grains, legumes, fruits, and vegetables. Changes in the diet involve replacing refined carbohydrates with unrefined foods. The calorie level of the diet is essentially unchanged. The Dietary Reference Intake (recommended amount) for adult males is 38 grams per day. For adult females, it is 25 grams per  day. Pregnant and lactating women should consume 28 grams of fiber per day. Fiber is the intact part of a plant that is not broken down during digestion. Functional fiber is fiber that has been isolated from the plant to provide a beneficial effect in the body. PURPOSE  Increase stool bulk.   Ease and regulate bowel movements.   Lower cholesterol.   REDUCE RISK OF COLON CANCER  INDICATIONS THAT YOU NEED MORE FIBER  Constipation and hemorrhoids.   Uncomplicated diverticulosis (intestine condition) and irritable bowel syndrome.   Weight management.   As a protective measure against hardening of the arteries (atherosclerosis), diabetes, and cancer.   GUIDELINES FOR INCREASING FIBER IN THE DIET  Start adding fiber to the diet slowly. A gradual increase of about 5 more grams (2 slices of whole-wheat bread, 2 servings of most fruits or vegetables, or 1 bowl of high-fiber cereal) per day is best. Too rapid an increase in fiber may result in constipation, flatulence, and bloating.   Drink enough water and fluids to keep your urine clear or pale yellow. Water, juice, or caffeine-free drinks are recommended. Not drinking enough fluid may cause constipation.   Eat a variety of high-fiber foods rather than one type of fiber.   Try to increase your intake of fiber through using high-fiber foods rather than fiber pills or supplements that contain small amounts of fiber.   The goal is to change the types of food eaten. Do not supplement your present diet with high-fiber foods, but replace foods in your present diet.   INCLUDE A VARIETY OF FIBER SOURCES  Replace refined and processed grains with whole grains, canned fruits with fresh fruits, and incorporate other fiber sources. White rice, white breads, and most bakery goods contain little or no fiber.   Brown whole-grain rice, buckwheat oats, and many fruits and vegetables are all good sources of fiber. These include: broccoli, Brussels  sprouts, cabbage, cauliflower, beets, sweet potatoes, white potatoes (skin on), carrots, tomatoes, eggplant, squash, berries, fresh fruits, and dried fruits.   Cereals appear to be the richest source of fiber. Cereal fiber is found in whole grains and bran. Bran is the fiber-rich outer coat of cereal grain, which is largely removed in refining. In whole-grain cereals, the bran remains. In breakfast cereals, the largest amount of fiber is found in those with "bran" in their names. The fiber content is sometimes indicated on the label.   You may need to include additional fruits and vegetables each day.   In baking, for 1 cup white flour, you may use the following substitutions:   1 cup whole-wheat flour minus 2 tablespoons.   1/2 cup white flour plus 1/2 cup whole-wheat flour.   Polyps, Colon  A polyp is extra tissue that grows inside your body. Colon polyps grow in the large intestine. The large intestine, also called the colon, is part of your digestive system. It  is a long, hollow tube at the end of your digestive tract where your body makes and stores stool. Most polyps are not dangerous. They are benign. This means they are not cancerous. But over time, some types of polyps can turn into cancer. Polyps that are smaller than a pea are usually not harmful. But larger polyps could someday become or may already be cancerous. To be safe, doctors remove all polyps and test them.   WHO GETS POLYPS? Anyone can get polyps, but certain people are more likely than others. You may have a greater chance of getting polyps if:  You are over 50.   You have had polyps before.   Someone in your family has had polyps.   Someone in your family has had cancer of the large intestine.   Find out if someone in your family has had polyps. You may also be more likely to get polyps if you:   Eat a lot of fatty foods   Smoke   Drink alcohol   Do not exercise  Eat too much     PREVENTION There is not  one sure way to prevent polyps. You might be able to lower your risk of getting them if you:  Eat more fruits and vegetables and less fatty food.   Do not smoke.   Avoid alcohol.   Exercise every day.   Lose weight if you are overweight.   Eating more calcium and folate can also lower your risk of getting polyps. Some foods that are rich in calcium are milk, cheese, and broccoli. Some foods that are rich in folate are chickpeas, kidney beans, and spinach.    Diverticulosis Diverticulosis is a common condition that develops when small pouches (diverticula) form in the wall of the colon. The risk of diverticulosis increases with age. It happens more often in people who eat a low-fiber diet. Most individuals with diverticulosis have no symptoms. Those individuals with symptoms usually experience belly (abdominal) pain, constipation, or loose stools (diarrhea).  HOME CARE INSTRUCTIONS  Increase the amount of fiber in your diet as directed by your caregiver or dietician. This may reduce symptoms of diverticulosis.   Drink at least 6 to 8 glasses of water each day to prevent constipation.   Try not to strain when you have a bowel movement.   Avoiding nuts and seeds to prevent complications is NOT NECESSARY.   FOODS HAVING HIGH FIBER CONTENT INCLUDE:  Fruits. Apple, peach, pear, tangerine, raisins, prunes.   Vegetables. Brussels sprouts, asparagus, broccoli, cabbage, carrot, cauliflower, romaine lettuce, spinach, summer squash, tomato, winter squash, zucchini.   Starchy Vegetables. Baked beans, kidney beans, lima beans, split peas, lentils, potatoes (with skin).   Grains. Whole wheat bread, brown rice, bran flake cereal, plain oatmeal, white rice, shredded wheat, bran muffins.    SEEK IMMEDIATE MEDICAL CARE IF:  You develop increasing pain or severe bloating.   You have an oral temperature above 101F.   You develop vomiting or bowel movements that are bloody or black.    Hemorrhoids Hemorrhoids are dilated (enlarged) veins around the rectum. Sometimes clots will form in the veins. This makes them swollen and painful. These are called thrombosed hemorrhoids. Causes of hemorrhoids include:  Constipation.   Straining to have a bowel movement.   HEAVY LIFTING  HOME CARE INSTRUCTIONS  Eat a well balanced diet and drink 6 to 8 glasses of water every day to avoid constipation. You may also use a bulk laxative.   Avoid  straining to have bowel movements.   Keep anal area dry and clean.   Do not use a donut shaped pillow or sit on the toilet for long periods. This increases blood pooling and pain.   Move your bowels when your body has the urge; this will require less straining and will decrease pain and pressure.

## 2016-12-11 NOTE — Progress Notes (Signed)
LMOM to call.

## 2016-12-12 ENCOUNTER — Encounter (HOSPITAL_COMMUNITY): Payer: Self-pay | Admitting: Gastroenterology

## 2016-12-12 NOTE — Progress Notes (Signed)
Letter mailed to pt to call for results.  

## 2016-12-13 ENCOUNTER — Telehealth: Payer: Self-pay | Admitting: Gastroenterology

## 2016-12-13 ENCOUNTER — Other Ambulatory Visit: Payer: Self-pay

## 2016-12-13 DIAGNOSIS — E611 Iron deficiency: Secondary | ICD-10-CM

## 2016-12-13 NOTE — Telephone Encounter (Signed)
LMOM for a return call. Lab orders on file for November 2018.

## 2016-12-13 NOTE — Telephone Encounter (Signed)
PLEASE CALL PT. BECAUSE WE STOPPED HER IRON PILLS, SHE NEEDS A CBC/FERRITIN IN NOV 2018.

## 2016-12-14 NOTE — Telephone Encounter (Signed)
LMOM to call.

## 2017-01-05 ENCOUNTER — Other Ambulatory Visit: Payer: Self-pay | Admitting: Family Medicine

## 2017-01-05 DIAGNOSIS — K219 Gastro-esophageal reflux disease without esophagitis: Secondary | ICD-10-CM

## 2017-01-05 MED FILL — POTASSIUM CL ER 20 MEQ TAB: 20 | 90 days supply | Qty: 180 | Fill #0 | Status: TO

## 2017-01-22 ENCOUNTER — Other Ambulatory Visit: Payer: Self-pay | Admitting: Family Medicine

## 2017-01-22 DIAGNOSIS — K219 Gastro-esophageal reflux disease without esophagitis: Secondary | ICD-10-CM

## 2017-01-22 MED FILL — DILTIAZEM HCL ER 300 MG CAP: 300 | 90 days supply | Qty: 90 | Fill #1

## 2017-01-23 MED FILL — VALSARTAN-HCTZ 320-25 MG TA: 320-25 | 90 days supply | Qty: 90 | Fill #0 | Status: TO

## 2017-01-23 MED FILL — PANTOPRAZOLE SOD DR 40 MG T: 40 | 90 days supply | Qty: 90 | Fill #0

## 2017-01-29 MED FILL — VALSARTAN-HCTZ 320-25 MG TA: 320-25 | 90 days supply | Qty: 90 | Fill #0

## 2017-02-06 ENCOUNTER — Other Ambulatory Visit: Payer: Self-pay

## 2017-02-06 DIAGNOSIS — E611 Iron deficiency: Secondary | ICD-10-CM

## 2017-02-19 ENCOUNTER — Encounter: Payer: Self-pay | Admitting: Family Medicine

## 2017-02-19 ENCOUNTER — Ambulatory Visit (INDEPENDENT_AMBULATORY_CARE_PROVIDER_SITE_OTHER): Payer: 59 | Admitting: Family Medicine

## 2017-02-19 VITALS — BP 120/88 | HR 92 | Temp 98.6°F | Resp 16 | Ht 63.0 in | Wt 182.1 lb

## 2017-02-19 DIAGNOSIS — Z23 Encounter for immunization: Secondary | ICD-10-CM | POA: Diagnosis not present

## 2017-02-19 DIAGNOSIS — E785 Hyperlipidemia, unspecified: Secondary | ICD-10-CM

## 2017-02-19 DIAGNOSIS — I10 Essential (primary) hypertension: Secondary | ICD-10-CM

## 2017-02-19 DIAGNOSIS — K219 Gastro-esophageal reflux disease without esophagitis: Secondary | ICD-10-CM | POA: Diagnosis not present

## 2017-02-19 MED ORDER — OLMESARTAN MEDOXOMIL-HCTZ 40-25 MG PO TABS: 1.0000 | ORAL_TABLET | Freq: Every day | ORAL | 3 refills | Status: DC

## 2017-02-19 MED FILL — OLMESARTAN-HCTZ 40-25 MG TA: 40-25 | 30 days supply | Qty: 30 | Fill #0

## 2017-02-19 NOTE — Patient Instructions (Addendum)
F/U end February, call if you need me before  Shingrix today  Nurse blood  -pressure check in 5 weeks  Stop Diovan/Hctz and start Benicar/HCTZ as soon as you get it  It is important that you exercise regularly at least 30 minutes 5 times a week. If you develop chest pain, have severe difficulty breathing, or feel very tired, stop exercising immediately and seek medical attention   Fasting lipid, cmp , CBC end Feb 5 days before visit  Thank you  for choosing [facility]. We consider it a privelige to serve you.  Delivering excellent health care in a caring and  compassionate way is our goal.  Partnering with you,  so that together we can achieve this goal is our strategy.

## 2017-02-19 NOTE — Assessment & Plan Note (Signed)
Hyperlipidemia:Low fat diet discussed and encouraged.   Lipid Panel  Lab Results  Component Value Date   CHOL 151 11/24/2016   HDL 40 (L) 11/24/2016   LDLCALC 93 11/24/2016   TRIG 89 11/24/2016   CHOLHDL 3.8 11/24/2016   Updated lab needed at/ before next visit.

## 2017-02-19 NOTE — Assessment & Plan Note (Signed)
Controlled, but will change to benicar/hctzbecause diovan has been withdrawn DASH diet and commitment to daily physical activity for a minimum of 30 minutes discussed and encouraged, as a part of hypertension management. The importance of attaining a healthy weight is also discussed.  BP/Weight 02/19/2017 12/07/2016 10/02/2016 07/14/2016 06/26/2016 06/19/2016 03/17/1593  Systolic BP 585 929 244 628 638 177 116  Diastolic BP 88 59 82 80 70 76 52  Wt. (Lbs) 182.12 - 179 178 175.12 176.1 179  BMI 32.26 - 31.71 31.53 31.02 31.19 31.71

## 2017-02-19 NOTE — Progress Notes (Signed)
Kristin Hall     MRN: [id number]      DOB: October 28, 1955   HPI Kristin Hall is here for follow up and re-evaluation of chronic medical conditions, medication management and review of any available recent lab and radiology data.  Preventive health is updated, specifically  Cancer screening and Immunization.   Questions or concerns regarding consultations or procedures which the PT has had in the interim are  addressed. The PT denies any adverse reactions to current medications since the last visit.  There are no new concerns. Still under stress caring for her ailing Mom, and still no exercise commitment There are no specific complaints   ROS Denies recent fever or chills. Denies sinus pressure, nasal congestion, ear pain or sore throat. Denies chest congestion, productive cough or wheezing. Denies chest pains, palpitations and leg swelling Denies abdominal pain, nausea, vomiting,diarrhea or constipation.   Denies dysuria, frequency, hesitancy or incontinence. Denies joint pain, swelling and limitation in mobility. Denies headaches, seizures, numbness, or tingling. Denies depression, anxiety or insomnia. Denies skin break down or rash.   PE  BP 120/88 (BP Location: Left Arm, Patient Position: Sitting, Cuff Size: Large)   Pulse 92   Temp 98.6 F (37 C) (Temporal)   Resp 16   Ht 5\' 3"  (1.6 m)   Wt 182 lb 1.9 oz (82.6 kg)   SpO2 95%   BMI 32.26 kg/m   Patient alert and oriented and in no cardiopulmonary distress.  HEENT: No facial asymmetry, EOMI,   oropharynx pink and moist.  Neck supple no JVD, no mass.  Chest: Clear to auscultation bilaterally.  CVS: S1, S2 no murmurs, no S3.Regular rate.  ABD: Soft non tender.   Ext: No edema  MS: Adequate ROM spine, shoulders, hips and knees.  Skin: Intact, no ulcerations or rash noted.  Psych: Good eye contact, normal affect. Memory intact not anxious or depressed appearing.  CNS: CN 2-12 intact, power,  normal throughout.no  focal deficits noted.   Assessment & Plan  Essential hypertension Controlled, but will change to benicar/hctzbecause diovan has been withdrawn DASH diet and commitment to daily physical activity for a minimum of 30 minutes discussed and encouraged, as a part of hypertension management. The importance of attaining a healthy weight is also discussed.  BP/Weight 02/19/2017 12/07/2016 10/02/2016 07/14/2016 06/26/2016 06/19/2016 05/01/2016  Systolic BP 120 118 122 122 108 114 129  Diastolic BP 88 59 82 80 70 76 52  Wt. (Lbs) 182.12 - 179 178 175.12 176.1 179  BMI 32.26 - 31.71 31.53 31.02 31.19 31.71       Obesity Deteriorated. Patient re-educated about  the importance of commitment to a  minimum of 150 minutes of exercise per week.  The importance of healthy food choices with portion control discussed. Encouraged to start a food diary, count calories and to consider  joining a support group. Sample diet sheets offered. Goals set by the patient for the next several months.   Weight /BMI 02/19/2017 10/02/2016 07/14/2016  WEIGHT 182 lb 1.9 oz 179 lb 178 lb  HEIGHT 5\' 3"  5\' 3"  5\' 3"   BMI 32.26 kg/m2 31.71 kg/m2 31.53 kg/m2      Hyperlipemia Hyperlipidemia:Low fat diet discussed and encouraged.   Lipid Panel  Lab Results  Component Value Date   CHOL 151 11/24/2016   HDL 40 (L) 11/24/2016   LDLCALC 93 11/24/2016   TRIG 89 11/24/2016   CHOLHDL 3.8 11/24/2016   Updated lab needed at/ before next visit.  GERD Controlled, no change in medication

## 2017-02-19 NOTE — Assessment & Plan Note (Addendum)
Controlled, no change in medication  

## 2017-02-19 NOTE — Assessment & Plan Note (Signed)
Deteriorated. Patient re-educated about  the importance of commitment to a  minimum of 150 minutes of exercise per week.  The importance of healthy food choices with portion control discussed. Encouraged to start a food diary, count calories and to consider  joining a support group. Sample diet sheets offered. Goals set by the patient for the next several months.   Weight /BMI 02/19/2017 10/02/2016 07/14/2016  WEIGHT 182 lb 1.9 oz 179 lb 178 lb  HEIGHT 5\' 3"  5\' 3"  5\' 3"   BMI 32.26 kg/m2 31.71 kg/m2 31.53 kg/m2

## 2017-03-19 ENCOUNTER — Telehealth: Payer: Self-pay | Admitting: Gastroenterology

## 2017-03-19 MED FILL — OLMESARTAN-HCTZ 40-25 MG TA: 40-25 | 90 days supply | Qty: 90 | Fill #1 | Status: TO

## 2017-03-19 NOTE — Telephone Encounter (Signed)
Pt lost her lab orders and wants to stop by tomorrow to pick them up. I told her we would print another copy and have up front for her.

## 2017-03-19 NOTE — Telephone Encounter (Signed)
Pt is aware she does not have to come by, she may go to [facility] and let them know she has lab orders from [facility] and Dr. Moshe Cipro.

## 2017-04-03 DIAGNOSIS — E611 Iron deficiency: Secondary | ICD-10-CM | POA: Diagnosis not present

## 2017-04-04 LAB — CBC WITH DIFFERENTIAL/PLATELET
BASOS PCT: 0.3 %
Basophils Absolute: 34 cells/uL (ref 0–200)
EOS ABS: 79 {cells}/uL (ref 15–500)
Eosinophils Relative: 0.7 %
HEMATOCRIT: 35.8 % (ref 35.0–45.0)
Hemoglobin: 12.1 g/dL (ref 11.7–15.5)
LYMPHS ABS: 3334 {cells}/uL (ref 850–3900)
MCH: 28.5 pg (ref 27.0–33.0)
MCHC: 33.8 g/dL (ref 32.0–36.0)
MCV: 84.2 fL (ref 80.0–100.0)
MPV: 11.3 fL (ref 7.5–12.5)
Monocytes Relative: 6.1 %
NEUTROS PCT: 63.4 %
Neutro Abs: 7164 cells/uL (ref 1500–7800)
PLATELETS: 436 10*3/uL — AB (ref 140–400)
RBC: 4.25 10*6/uL (ref 3.80–5.10)
RDW: 12.6 % (ref 11.0–15.0)
TOTAL LYMPHOCYTE: 29.5 %
WBC: 11.3 10*3/uL — AB (ref 3.8–10.8)
WBCMIX: 689 {cells}/uL (ref 200–950)

## 2017-04-04 LAB — FERRITIN: Ferritin: 90 ng/mL (ref 20–288)

## 2017-04-09 NOTE — Progress Notes (Signed)
LMOM to call.

## 2017-04-10 NOTE — Progress Notes (Signed)
Letter mailed to pt with results

## 2017-04-13 MED FILL — EZETIMIBE-SIMVASTATIN 10-10: 10-10 | 30 days supply | Qty: 30 | Fill #1 | Status: TO

## 2017-04-13 MED FILL — POTASSIUM CL ER 20 MEQ TAB: 20 | 90 days supply | Qty: 180 | Fill #0 | Status: TO

## 2017-04-16 MED FILL — PANTOPRAZOLE SOD DR 40 MG T: 40 | 90 days supply | Qty: 90 | Fill #1 | Status: TO

## 2017-04-30 ENCOUNTER — Other Ambulatory Visit: Payer: Self-pay | Admitting: Family Medicine

## 2017-04-30 MED FILL — DILTIAZEM HCL ER 300 MG CAP: 300 | 90 days supply | Qty: 90 | Fill #0 | Status: TO

## 2017-05-09 DIAGNOSIS — H5203 Hypermetropia, bilateral: Secondary | ICD-10-CM | POA: Diagnosis not present

## 2017-05-09 DIAGNOSIS — H52223 Regular astigmatism, bilateral: Secondary | ICD-10-CM | POA: Diagnosis not present

## 2017-05-09 DIAGNOSIS — H524 Presbyopia: Secondary | ICD-10-CM | POA: Diagnosis not present

## 2017-06-08 DIAGNOSIS — E785 Hyperlipidemia, unspecified: Secondary | ICD-10-CM | POA: Diagnosis not present

## 2017-06-08 DIAGNOSIS — I10 Essential (primary) hypertension: Secondary | ICD-10-CM | POA: Diagnosis not present

## 2017-06-08 LAB — COMPLETE METABOLIC PANEL WITH GFR
AG Ratio: 1.4 (calc) (ref 1.0–2.5)
ALBUMIN MSPROF: 4.2 g/dL (ref 3.6–5.1)
ALKALINE PHOSPHATASE (APISO): 136 U/L — AB (ref 33–130)
ALT: 12 U/L (ref 6–29)
AST: 18 U/L (ref 10–35)
BILIRUBIN TOTAL: 0.5 mg/dL (ref 0.2–1.2)
BUN: 11 mg/dL (ref 7–25)
CHLORIDE: 101 mmol/L (ref 98–110)
CO2: 31 mmol/L (ref 20–32)
CREATININE: 0.85 mg/dL (ref 0.50–0.99)
Calcium: 9.8 mg/dL (ref 8.6–10.4)
GFR, EST AFRICAN AMERICAN: 86 mL/min/{1.73_m2} (ref >=60)
GFR, Est Non African American: 74 mL/min/{1.73_m2} (ref >=60)
GLOBULIN: 3.1 g/dL (ref 1.9–3.7)
GLUCOSE: 98 mg/dL (ref 65–99)
Potassium: 4.2 mmol/L (ref 3.5–5.3)
Sodium: 139 mmol/L (ref 135–146)
TOTAL PROTEIN: 7.3 g/dL (ref 6.1–8.1)

## 2017-06-08 LAB — CBC
HEMATOCRIT: 35.7 % (ref 35.0–45.0)
HEMOGLOBIN: 12 g/dL (ref 11.7–15.5)
MCH: 28.6 pg (ref 27.0–33.0)
MCHC: 33.6 g/dL (ref 32.0–36.0)
MCV: 85 fL (ref 80.0–100.0)
MPV: 11.1 fL (ref 7.5–12.5)
Platelets: 421 10*3/uL — ABNORMAL HIGH (ref 140–400)
RBC: 4.2 10*6/uL (ref 3.80–5.10)
RDW: 11.9 % (ref 11.0–15.0)
WBC: 9.8 10*3/uL (ref 3.8–10.8)

## 2017-06-08 LAB — LIPID PANEL
CHOLESTEROL: 154 mg/dL (ref <200)
HDL: 38 mg/dL — AB (ref >50)
LDL Cholesterol (Calc): 95 mg/dL (calc)
Non-HDL Cholesterol (Calc): 116 mg/dL (calc) (ref <130)
TRIGLYCERIDES: 113 mg/dL (ref <150)
Total CHOL/HDL Ratio: 4.1 (calc) (ref <5.0)

## 2017-06-11 ENCOUNTER — Encounter: Payer: Self-pay | Admitting: Family Medicine

## 2017-06-12 ENCOUNTER — Telehealth: Payer: Self-pay | Admitting: Family Medicine

## 2017-06-12 NOTE — Telephone Encounter (Signed)
pl sched physical exam May 23 or after , no appt in system, tx  Received: Yesterday  Message Contents  Fayrene Helper, MD  May, Betsy A   Left msg for pt to call back to schedule an appt

## 2017-06-14 ENCOUNTER — Other Ambulatory Visit: Payer: Self-pay | Admitting: Cardiovascular Disease

## 2017-06-18 MED FILL — SHIPPING COST: 1 days supply | Qty: 1 | Fill #0

## 2017-06-18 MED FILL — OLMESARTAN-HCTZ 40-25 MG TA: 40-25 | 90 days supply | Qty: 90 | Fill #0

## 2017-07-05 DIAGNOSIS — H40003 Preglaucoma, unspecified, bilateral: Secondary | ICD-10-CM | POA: Diagnosis not present

## 2017-07-09 MED FILL — SHIPPING COST: 1 days supply | Qty: 1 | Fill #1

## 2017-07-09 MED FILL — EZETIMIBE-SIMVASTATIN 10-10: 10-10 | 90 days supply | Qty: 90 | Fill #0

## 2017-07-09 MED FILL — POTASSIUM CL ER 20 MEQ TABL: 20 | 90 days supply | Qty: 180 | Fill #0

## 2017-07-23 MED FILL — SHIPPING COST: 1 days supply | Qty: 1 | Fill #2

## 2017-07-23 MED FILL — PANTOPRAZOLE SOD DR 40 MG T: 40 | 90 days supply | Qty: 90 | Fill #0

## 2017-07-26 ENCOUNTER — Other Ambulatory Visit: Payer: Self-pay | Admitting: Family Medicine

## 2017-07-27 MED FILL — SHIPPING COST: 1 days supply | Qty: 1 | Fill #3

## 2017-07-27 MED FILL — DILTIAZEM 24HR ER 300 MG CA: 300 | 90 days supply | Qty: 90 | Fill #0

## 2017-08-04 ENCOUNTER — Telehealth: Payer: 59 | Admitting: Physician Assistant

## 2017-08-04 DIAGNOSIS — R109 Unspecified abdominal pain: Secondary | ICD-10-CM

## 2017-08-04 DIAGNOSIS — R1030 Lower abdominal pain, unspecified: Secondary | ICD-10-CM

## 2017-08-04 NOTE — Progress Notes (Signed)
Based on what you shared with me it looks like you have a serious condition that should be evaluated in a face to face office visit.  NOTE: If you entered your credit card information for this eVisit, you will not be charged. You may see a "hold" on your card for the $30 but that hold will drop off and you will not have a charge processed.  If you are having a true medical emergency please call 911.  If you need an urgent face to face visit, [facility] has four urgent care centers for your convenience.  If you need care fast and have a high deductible or no insurance consider:   [email or web address] to reserve your spot online an avoid wait times  [facility] [address], [address] [place], [place] [address] 8 am to 8 pm Monday-Friday 10 am to 4 pm Saturday-Sunday *Across the street from [employer]  [facility]  [address] [place], [address] 8 am to 5 pm Monday-Friday * In the [facility] the [facility]   The following sites will take your  insurance:  . [facility]  [phone] Get Driving Directions Find a Provider at this Location  [address] [place], [place] [address] . 10 am to 8 pm Monday-Friday . 12 pm to 8 pm Saturday-Sunday   . [facility]  [phone] Get Driving Directions Find a Provider at this Location  [address], [address] [place], [place] [address] . 8 am to 8 pm Monday-Friday . 9 am to 6 pm Saturday . 11 am to 6 pm Sunday   . [facility] [place]  [phone] Get Driving Directions  [address] [place],  [address] . 8 am to 8 pm Monday-Friday . 8 am to 4 pm Saturday-Sunday   Your e-visit answers were reviewed by a board certified advanced clinical practitioner to complete your personal care plan.  Thank you for using e-Visits.  

## 2017-08-06 ENCOUNTER — Telehealth: Payer: Self-pay | Admitting: Family Medicine

## 2017-08-06 NOTE — Telephone Encounter (Signed)
Patient states she has a bacterial flare up in her stomach and Dr.Simpson normally calls her in something for this. Please advise. Pharmacy: [facility] Cb#: [phone]

## 2017-08-07 NOTE — Telephone Encounter (Signed)
Called patient regarding message below. No answer, left generic message for patient to return call.   

## 2017-08-07 NOTE — Telephone Encounter (Signed)
States for the past few days she has been hurting in her lower abdomen and the lower right side all the way across and her lower abdomen seems to feel warmer than the rest. I offered an appt but she said she has to work and cannot come in. She said she thinks ita a stomach infection like she had before that you treated but she couldn't remember the treatment. Please advise

## 2017-08-07 NOTE — Telephone Encounter (Signed)
pls call and document her symptoms , I am confused, gI infections are generally viral so not sure if sjhe is just talking about zofran and lomotil???

## 2017-08-08 ENCOUNTER — Encounter: Payer: Self-pay | Admitting: *Deleted

## 2017-08-08 ENCOUNTER — Encounter: Payer: Self-pay | Admitting: Gastroenterology

## 2017-08-08 ENCOUNTER — Telehealth: Payer: Self-pay | Admitting: *Deleted

## 2017-08-08 ENCOUNTER — Ambulatory Visit: Payer: 59 | Admitting: Gastroenterology

## 2017-08-08 VITALS — BP 148/72 | HR 99 | Temp 97.6°F | Ht 63.0 in | Wt 183.8 lb

## 2017-08-08 DIAGNOSIS — R1030 Lower abdominal pain, unspecified: Secondary | ICD-10-CM

## 2017-08-08 NOTE — Telephone Encounter (Signed)
Called [facility] and spoke with Terri. Was advised no PA was required for CT abd/pelvis with contrast. Ref #[id number]

## 2017-08-08 NOTE — Assessment & Plan Note (Signed)
New onset lower abdominal pain radiating to lower back, 10/10 for past 2 weeks. No fever, chills. Associated nausea and loss of appetite. No known history of diverticula. No urinary symptoms. Only relief with Bufferin then returns shortly thereafter. Interfering with work.   Check CBC, CMP, UA with culture. 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 Could have element of underlying constipation but need to rule out other process first.

## 2017-08-08 NOTE — Progress Notes (Signed)
cc'ed to pcp °

## 2017-08-08 NOTE — Progress Notes (Signed)
Primary Care Physician:  Fayrene Helper, MD Primary GI: Dr. Oneida Alar   Chief Complaint  Patient presents with  . Abdominal Pain    x1 week  . Nausea    HPI:   Kristin Hall is a 62 y.o. female presenting today with a history of chronically elevated alk phos, < 1.5X ULN. GGT normal. Negative AMA. MRCP on file due to concern for possible CBD dilation was normal. HFP followed yearly. Also notable history of large colonic polyps in 2018, with surveillance due in 2021. Presents today with abdominal pain and nausea for the past week.   A week after GERD flare, had abdominal pain starting. Had eaten chicken wings several ago and noted the GERD flare. GERD is now controlled. Took enema to see if this would help with discomfort. Has lower abdominal discomfort and lower back discomfort. Feels like her stomach is warm. Not eating as much. Doesn't have urinary frequency, no odor, no burning with urination. Upper epigastric region is "sore" but only when bending over. Lower abdomen is burning. 10/10. No fever or chills. Intermittent nausea after eating. Has a sour taste in mouth after eating. Says she was treated for an infection in the lining of her stomach many years ago. Sounds like H.pylori. Pain is constant. Wakes up with it, all day long. BM small amounts. Not eating as much. Unchanged with eating. Took Bufferin, which eases it off but then returns. No dysphagia. No issues with GERD currently.    Past Medical History:  Diagnosis Date  . Elevated alkaline phosphatase level   . Headache   . Hyperlipidemia   . Hypertension   . Thrombocytosis ([place]) 07/23/2015    Past Surgical History:  Procedure Laterality Date  . ABDOMINAL HYSTERECTOMY     partial   . COLONOSCOPY  10/04/2006   SLF: Normal retroflexed view of the rectum/Normal colon without evidence of polyps, masses, inflammatory changes, diverticula or arteriovenous malformations  . COLONOSCOPY N/A 12/07/2016   Dr. Oneida Alar: 15 mm  polyp in cecum, s/p APC and clips. One 30 mm polyp in sigmoid, s/p clip, injection, tattooed. Path with one adenoma and one hamartomatous polyp. Surveillance 2021.   Marland Kitchen POLYPECTOMY  12/07/2016   Procedure: POLYPECTOMY;  Surgeon: Danie Binder, MD;  Location: [facility];  Service: Endoscopy;;  cecal;    Current Outpatient Medications  Medication Sig Dispense Refill  . acetaminophen (TYLENOL) 500 MG tablet Take 1 tablet (500 mg total) by mouth every 8 (eight) hours as needed. 30 tablet 0  . diltiazem (TIAZAC) 300 MG 24 hr capsule TAKE 1 CAPSULE BY MOUTH ONCE DAILY 90 capsule 1  . Multiple Vitamins-Minerals (ALIVE WOMENS 50+) TABS Take 1 tablet by mouth daily.    Marland Kitchen olmesartan-hydrochlorothiazide (BENICAR HCT) 40-25 MG tablet Take 1 tablet by mouth daily. 90 tablet 3  . pantoprazole (PROTONIX) 40 MG tablet TAKE 1 TABLET BY MOUTH DAILY. 90 tablet PRN  . potassium chloride SA (K-DUR,KLOR-CON) 20 MEQ tablet TAKE 2 TABLETS BY MOUTH DAILY 180 tablet 0  . VYTORIN 10-10 MG tablet TAKE 1 TABLET BY MOUTH AT BEDTIME FOR CHOLESTEROL. (Patient taking differently: TAKE 1 TABLET BY MOUTH AT BEDTIME FOR CHOLESTEROL. Takes Mon, Wed, Fri) 30 tablet 5   No current facility-administered medications for this visit.     Allergies as of 08/08/2017 - Review Complete 08/08/2017  Allergen Reaction Noted  . Amlodipine Other (See Comments) 06/07/2015  . Aspirin Other (See Comments) 09/22/2013  . Metronidazole Hives and  Itching 11/12/2007  . Valsartan Cough 04/19/2015    Family History  Problem Relation Age of Onset  . Cancer Mother        Breast  . Dementia Mother   . Cancer Father        Lung Cancer, deceased at 71  . Colon cancer Neg Hx   . Liver disease Neg Hx     Social History   Socioeconomic History  . Marital status: Widowed    Spouse name: Not on file  . Number of children: Not on file  . Years of education: Not on file  . Highest education level: Not on file  Occupational History  . Not on  file  Social Needs  . Financial resource strain: Not on file  . Food insecurity:    Worry: Not on file    Inability: Not on file  . Transportation needs:    Medical: Not on file    Non-medical: Not on file  Tobacco Use  . Smoking status: Never Smoker  . Smokeless tobacco: Never Used  Substance and Sexual Activity  . Alcohol use: No  . Drug use: No  . Sexual activity: Yes  Lifestyle  . Physical activity:    Days per week: Not on file    Minutes per session: Not on file  . Stress: Not on file  Relationships  . Social connections:    Talks on phone: Not on file    Gets together: Not on file    Attends religious service: Not on file    Active member of club or organization: Not on file    Attends meetings of clubs or organizations: Not on file    Relationship status: Not on file  Other Topics Concern  . Not on file  Social History Narrative  . Not on file    Review of Systems: Gen: Denies fever, chills, anorexia. Denies fatigue, weakness, weight loss.  CV: Denies chest pain, palpitations, syncope, peripheral edema, and claudication. Resp: Denies dyspnea at rest, cough, wheezing, coughing up blood, and pleurisy. GI: see HPI  Derm: Denies rash, itching, dry skin Psych: Denies depression, anxiety, memory loss, confusion. No homicidal or suicidal ideation.  Heme: Denies bruising, bleeding, and enlarged lymph nodes.  Physical Exam: BP (!) 148/72   Pulse 99   Temp 97.6 F (36.4 C) (Oral)   Ht '5\' 3"'$  (1.6 m)   Wt 183 lb 12.8 oz (83.4 kg)   BMI 32.56 kg/m  General:   Alert and oriented. No distress noted. Pleasant and cooperative.  Head:  Normocephalic and atraumatic. Eyes:  Conjuctiva clear without scleral icterus. Mouth:  Oral mucosa pink and moist.  Abdomen:  +BS, soft, TTP diffusely lower abdomen. Very mild discomfort upper abdomen.  No HSM, rebound, or guarding. Msk:  Symmetrical without gross deformities. Normal posture. Negative flank tenderness.  Extremities:   Without edema. Neurologic:  Alert and  oriented x4 Psych:  Alert and cooperative. Normal mood and affect.

## 2017-08-08 NOTE — Telephone Encounter (Signed)
I attempted to call pt today and on record review, she has been seen by the gI clinic today, which is good

## 2017-08-08 NOTE — Patient Instructions (Signed)
Please have blood work done and urine sample today.   I have ordered a CT for further evaluation.   Further recommendations to follow!  It was a pleasure to see you today. I strive to create trusting relationships with patients to provide genuine, compassionate, and quality care. I value your feedback. If you receive a survey regarding your visit,  I greatly appreciate you taking time to fill this out.   Annitta Needs, PhD, ANP-BC [facility]

## 2017-08-09 ENCOUNTER — Ambulatory Visit (HOSPITAL_COMMUNITY)
Admission: RE | Admit: 2017-08-09 | Discharge: 2017-08-09 | Disposition: A | Discharge status: Home / Self Care | Payer: 59 | Source: Ambulatory Visit | Attending: Gastroenterology | Admitting: Gastroenterology

## 2017-08-09 DIAGNOSIS — R1030 Lower abdominal pain, unspecified: Secondary | ICD-10-CM | POA: Diagnosis not present

## 2017-08-09 DIAGNOSIS — R109 Unspecified abdominal pain: Secondary | ICD-10-CM | POA: Diagnosis not present

## 2017-08-09 LAB — URINALYSIS W MICROSCOPIC + REFLEX CULTURE
Bacteria, UA: NONE SEEN /HPF
Bilirubin Urine: NEGATIVE
Glucose, UA: NEGATIVE
HGB URINE DIPSTICK: NEGATIVE
HYALINE CAST: NONE SEEN /LPF
Ketones, ur: NEGATIVE
Leukocyte Esterase: NEGATIVE
Nitrites, Initial: NEGATIVE
PROTEIN: NEGATIVE
RBC / HPF: NONE SEEN /HPF (ref 0–2)
Specific Gravity, Urine: 1.014 (ref 1.001–1.03)
pH: <=5 (ref 5.0–8.0)

## 2017-08-09 LAB — COMPLETE METABOLIC PANEL WITH GFR
AG Ratio: 1.6 (calc) (ref 1.0–2.5)
ALT: 14 U/L (ref 6–29)
AST: 19 U/L (ref 10–35)
Albumin: 4.5 g/dL (ref 3.6–5.1)
Alkaline phosphatase (APISO): 137 U/L — ABNORMAL HIGH (ref 33–130)
BUN: 12 mg/dL (ref 7–25)
CALCIUM: 10.3 mg/dL (ref 8.6–10.4)
CO2: 30 mmol/L (ref 20–32)
CREATININE: 0.82 mg/dL (ref 0.50–0.99)
Chloride: 102 mmol/L (ref 98–110)
GFR, EST NON AFRICAN AMERICAN: 78 mL/min/{1.73_m2} (ref >=60)
GFR, Est African American: 90 mL/min/{1.73_m2} (ref >=60)
Globulin: 2.9 g/dL (calc) (ref 1.9–3.7)
Glucose, Bld: 100 mg/dL — ABNORMAL HIGH (ref 65–99)
POTASSIUM: 3.8 mmol/L (ref 3.5–5.3)
Sodium: 141 mmol/L (ref 135–146)
Total Bilirubin: 0.5 mg/dL (ref 0.2–1.2)
Total Protein: 7.4 g/dL (ref 6.1–8.1)

## 2017-08-09 LAB — CBC WITH DIFFERENTIAL/PLATELET
Basophils Absolute: 31 cells/uL (ref 0–200)
Basophils Relative: 0.3 %
EOS ABS: 83 {cells}/uL (ref 15–500)
Eosinophils Relative: 0.8 %
HCT: 37 % (ref 35.0–45.0)
Hemoglobin: 12.5 g/dL (ref 11.7–15.5)
Lymphs Abs: 2818 cells/uL (ref 850–3900)
MCH: 28.7 pg (ref 27.0–33.0)
MCHC: 33.8 g/dL (ref 32.0–36.0)
MCV: 84.9 fL (ref 80.0–100.0)
MPV: 11.1 fL (ref 7.5–12.5)
Monocytes Relative: 6.8 %
NEUTROS PCT: 65 %
Neutro Abs: 6760 cells/uL (ref 1500–7800)
PLATELETS: 395 10*3/uL (ref 140–400)
RBC: 4.36 10*6/uL (ref 3.80–5.10)
RDW: 12.2 % (ref 11.0–15.0)
TOTAL LYMPHOCYTE: 27.1 %
WBC: 10.4 10*3/uL (ref 3.8–10.8)
WBCMIX: 707 {cells}/uL (ref 200–950)

## 2017-08-09 LAB — NO CULTURE INDICATED

## 2017-08-09 MED ORDER — IOPAMIDOL (ISOVUE-300) INJECTION 61%
100.0000 mL | Freq: Once | INTRAVENOUS | Status: AC | PRN
  Administered 2017-08-09: 100 mL via INTRAVENOUS

## 2017-08-09 NOTE — Progress Notes (Signed)
All labs normal. Contacted patient personally and informed. CT pending.

## 2017-08-10 NOTE — Progress Notes (Signed)
Left message at home on 3/28. Personally spoke with her 3/29. BMs not as productive as should be. Start Miralax each evening. Call if needs samples and could try Linzess 72 mcg. Due to back pain, see PCP as well to ensure back pain is not causing referred issues. Abdominal pain is not upper abdomen, reflux is controlled.

## 2017-08-13 ENCOUNTER — Telehealth: Payer: Self-pay | Admitting: Gastroenterology

## 2017-08-13 NOTE — Telephone Encounter (Signed)
Samples are at front for pick up.

## 2017-08-13 NOTE — Telephone Encounter (Signed)
Spoke with patient. Now dealing with constipation.   Please leave Linzess 145 mcg and 290 mcg for patient at the front desk. She will be coming by after work. I have asked her to do the 145 mcg dosing first. If no improvement, can start 290 mcg.

## 2017-08-27 ENCOUNTER — Telehealth: Payer: Self-pay

## 2017-08-27 NOTE — Telephone Encounter (Signed)
Per Roseanne Kaufman, NP pt needs samples of amitiza 8 mcg. #16 at front for pick up for pt to take one capsule bid with food.

## 2017-09-04 ENCOUNTER — Telehealth: Payer: Self-pay

## 2017-09-04 MED ORDER — LUBIPROSTONE 8 MCG PO CAPS: 8.0000 ug | ORAL_CAPSULE | Freq: Two times a day (BID) | ORAL | 3 refills | Status: DC

## 2017-09-04 NOTE — Addendum Note (Signed)
Addended by: Annitta Needs on: 09/04/2017 08:20 AM   Modules accepted: Orders

## 2017-09-04 NOTE — Telephone Encounter (Signed)
PA has been started for Amitiza.

## 2017-09-04 NOTE — Telephone Encounter (Signed)
Amitiza 8 mcg working well for patient. Sent prescription to [facility].

## 2017-09-10 ENCOUNTER — Telehealth: Payer: Self-pay | Admitting: Gastroenterology

## 2017-09-10 NOTE — Telephone Encounter (Signed)
Spoke to Sobieski at the pharmacy and she said with the coupon pt would pay only $75.00 and she said pt said that was still too much for her. I am leaving a coupon at front desk. PT is aware.

## 2017-09-10 NOTE — Telephone Encounter (Addendum)
PA approved # [id number] on the Amitiza 8 mcg. Effective for 12 refills from 09/05/2017-09/05/2018.  Faxed approval to [facility] outpatient pharmacy.  Sending to be scanned.

## 2017-09-10 NOTE — Telephone Encounter (Signed)
Doris: can you leave a copay card for Amitiza at the front for patient? Could we also call the pharmacy and see what's going on with the out of pocket expense? (300$ per patient).

## 2017-09-12 MED FILL — SHIPPING COST: 1 days supply | Qty: 1 | Fill #4

## 2017-09-12 MED FILL — AMITIZA 8 MCG CAPSULE: 8 | 90 days supply | Qty: 180 | Fill #0

## 2017-09-19 MED FILL — OLMESARTAN-HCTZ 40-25 MG TA: 40-25 | 30 days supply | Qty: 30 | Fill #1

## 2017-09-19 MED FILL — SHIPPING COST: 1 days supply | Qty: 1 | Fill #5

## 2017-10-04 ENCOUNTER — Other Ambulatory Visit: Payer: Self-pay | Admitting: Cardiovascular Disease

## 2017-10-04 MED FILL — POTASSIUM CL ER 20 MEQ TABL: 20 | 30 days supply | Qty: 60 | Fill #0

## 2017-10-04 MED FILL — SHIPPING COST: 1 days supply | Qty: 1 | Fill #6

## 2017-10-15 MED FILL — OLMESARTAN-HCTZ 40-25 MG TA: 40-25 | 30 days supply | Qty: 30 | Fill #2

## 2017-10-15 MED FILL — SHIPPING COST: 1 days supply | Qty: 1 | Fill #7

## 2017-10-15 MED FILL — PANTOPRAZOLE SOD DR 40 MG T: 40 | 90 days supply | Qty: 90 | Fill #1

## 2017-10-29 ENCOUNTER — Encounter: Payer: Self-pay | Admitting: Family Medicine

## 2017-10-29 ENCOUNTER — Ambulatory Visit (INDEPENDENT_AMBULATORY_CARE_PROVIDER_SITE_OTHER): Payer: 59 | Admitting: Family Medicine

## 2017-10-29 VITALS — BP 138/90 | HR 86 | Resp 16 | Ht 63.0 in | Wt 182.0 lb

## 2017-10-29 DIAGNOSIS — Z1231 Encounter for screening mammogram for malignant neoplasm of breast: Secondary | ICD-10-CM | POA: Diagnosis not present

## 2017-10-29 DIAGNOSIS — R748 Abnormal levels of other serum enzymes: Secondary | ICD-10-CM | POA: Diagnosis not present

## 2017-10-29 DIAGNOSIS — E785 Hyperlipidemia, unspecified: Secondary | ICD-10-CM | POA: Diagnosis not present

## 2017-10-29 DIAGNOSIS — E559 Vitamin D deficiency, unspecified: Secondary | ICD-10-CM

## 2017-10-29 DIAGNOSIS — Z0000 Encounter for general adult medical examination without abnormal findings: Secondary | ICD-10-CM | POA: Diagnosis not present

## 2017-10-29 DIAGNOSIS — Z23 Encounter for immunization: Secondary | ICD-10-CM

## 2017-10-29 DIAGNOSIS — I10 Essential (primary) hypertension: Secondary | ICD-10-CM

## 2017-10-29 NOTE — Assessment & Plan Note (Signed)

## 2017-10-29 NOTE — Patient Instructions (Addendum)
F/u in early  September, call if you need me sooner  Please schedule mammogram at checkout  Shingrix # 2 today  Please commit to  Exercise for 150 minutes per week  Please change food choice and portion size with a weight loss goal of 5 pounds   Blood pressure elevated at visit, if still high at next visit , then medication will need to be modified  Fasting lipid, cmp and eGFR , TSH and vit D end August  Thank you  for choosing [facility]. We consider it a privelige to serve you.  Delivering excellent health care in a caring and  compassionate way is our goal.  Partnering with you,  so that together we can achieve this goal is our strategy.

## 2017-10-29 NOTE — Progress Notes (Signed)
Kristin Hall     MRN: [id number]      DOB: 09-17-1955  HPI: Patient is in for annual physical exam. No other health concerns are expressed or addressed at the visit. Immunization is updated and shingrix is administered   PE: BP 138/90   Pulse 86   Resp 16   Ht 5\' 3"  (1.6 m)   Wt 182 lb (82.6 kg)   SpO2 97%   BMI 32.24 kg/m   Pleasant  female, alert and oriented x 3, in no cardio-pulmonary distress. Afebrile. HEENT No facial trauma or asymetry. Sinuses non tender.  Extra occullar muscles intact,  External ears normal, tympanic membranes clear. Oropharynx moist, no exudate. Neck: supple, no adenopathy,JVD or thyromegaly.No bruits.  Chest: Clear to ascultation bilaterally.No crackles or wheezes. Non tender to palpation  Breast: No asymetry,no masses or lumps. No tenderness. No nipple discharge or inversion. No axillary or supraclavicular adenopathy  Cardiovascular system; Heart sounds normal,  S1 and  S2 ,no S3.  No murmur, or thrill. Apical beat not displaced Peripheral pulses normal.  Abdomen: Soft, non tender, no organomegaly or masses. No bruits. Bowel sounds normal. No guarding, tenderness or rebound.  Rectal:  Normal sphincter tone. No rectal mass. Guaiac negative stool.  GU: Not examined  Musculoskeletal exam: Full ROM of spine, hips , shoulders and knees. No deformity ,swelling or crepitus noted. No muscle wasting or atrophy.   Neurologic: Cranial nerves 2 to 12 intact. Power, tone ,sensation and reflexes normal throughout. No disturbance in gait. No tremor.  Skin: Intact, no ulceration, erythema , scaling or rash noted. Pigmentation normal throughout  Psych; Normal mood and affect. Judgement and concentration normal   Assessment & Plan:  Annual physical exam Annual exam as documented. Counseling done  re healthy lifestyle involving commitment to 150 minutes exercise per week, heart healthy diet, and attaining healthy weight.The  importance of adequate sleep also discussed. Regular seat belt use and home safety, is also discussed. Changes in health habits are decided on by the patient with goals and time frames  set for achieving them. Immunization and cancer screening needs are specifically addressed at this visit.   Need for zoster vaccination After obtaining informed consent, the vaccine is  administered by LPN.

## 2017-11-02 ENCOUNTER — Encounter: Payer: Self-pay | Admitting: Family Medicine

## 2017-11-02 DIAGNOSIS — Z23 Encounter for immunization: Secondary | ICD-10-CM | POA: Insufficient documentation

## 2017-11-02 NOTE — Assessment & Plan Note (Signed)
After obtaining informed consent, the vaccine is  administered by LPN.  

## 2017-11-05 MED FILL — SHIPPING COST: 1 days supply | Qty: 1 | Fill #8

## 2017-11-05 MED FILL — DILTIAZEM 24HR ER 300 MG CA: 300 | 90 days supply | Qty: 90 | Fill #1

## 2017-11-06 ENCOUNTER — Telehealth: Payer: Self-pay

## 2017-11-06 NOTE — Telephone Encounter (Signed)
Per Roseanne Kaufman, NP, I am leaving samples of Amitiza 24 mcg at front desk for pt to take one capsule bid with food.  #16 left at front for her.

## 2017-11-14 ENCOUNTER — Other Ambulatory Visit: Payer: Self-pay | Admitting: Cardiovascular Disease

## 2017-11-14 MED FILL — SHIPPING COST: 1 days supply | Qty: 1 | Fill #9

## 2017-11-14 MED FILL — POTASSIUM CL ER 20 MEQ TABL: 20 | 7 days supply | Qty: 14 | Fill #0

## 2017-11-14 MED FILL — OLMESARTAN-HCTZ 40-25 MG TA: 40-25 | 30 days supply | Qty: 30 | Fill #3

## 2017-11-19 ENCOUNTER — Ambulatory Visit (HOSPITAL_COMMUNITY): Payer: 59

## 2017-11-22 ENCOUNTER — Telehealth: Payer: Self-pay | Admitting: Cardiovascular Disease

## 2017-11-22 MED ORDER — POTASSIUM CHLORIDE CRYS ER 20 MEQ PO TBCR: 40.0000 meq | EXTENDED_RELEASE_TABLET | Freq: Every day | ORAL | 0 refills | Status: DC

## 2017-11-22 MED FILL — POTASSIUM CL ER 20 MEQ TAB: 20 | 90 days supply | Qty: 180 | Fill #0

## 2017-11-22 NOTE — Telephone Encounter (Signed)
Called pt to advise her that she will need an appointment for future refills of her potassium. She stated that she will have Dr. Moshe Cipro follow it from this point on.

## 2017-11-22 NOTE — Telephone Encounter (Signed)
Patient needs Potassium 90 day supply sent to [facility] pharmacy/tg

## 2017-12-07 ENCOUNTER — Encounter (HOSPITAL_COMMUNITY): Payer: Self-pay

## 2017-12-07 ENCOUNTER — Ambulatory Visit (HOSPITAL_COMMUNITY)
Admission: RE | Admit: 2017-12-07 | Discharge: 2017-12-07 | Disposition: A | Discharge status: Home / Self Care | Payer: 59 | Source: Ambulatory Visit | Attending: Family Medicine | Admitting: Family Medicine

## 2017-12-07 DIAGNOSIS — Z1231 Encounter for screening mammogram for malignant neoplasm of breast: Secondary | ICD-10-CM | POA: Diagnosis not present

## 2017-12-17 MED FILL — OLMESARTAN-HCTZ 40-25 MG TA: 40-25 | 30 days supply | Qty: 30 | Fill #4

## 2017-12-18 MED FILL — SHIPPING COST: 1 days supply | Qty: 1 | Fill #10

## 2017-12-20 ENCOUNTER — Encounter: Payer: Self-pay | Admitting: Gastroenterology

## 2018-01-21 ENCOUNTER — Other Ambulatory Visit: Payer: Self-pay | Admitting: Family Medicine

## 2018-01-21 MED FILL — SHIPPING COST: 1 days supply | Qty: 1 | Fill #11

## 2018-01-21 MED FILL — OLMESARTAN-HCTZ 40-25 MG TA: 40-25 | 90 days supply | Qty: 90 | Fill #0

## 2018-01-21 MED FILL — PANTOPRAZOLE SOD DR 40 MG T: 40 | 90 days supply | Qty: 90 | Fill #2

## 2018-02-01 ENCOUNTER — Other Ambulatory Visit: Payer: Self-pay | Admitting: Family Medicine

## 2018-02-01 MED FILL — SHIPPING COST: 1 days supply | Qty: 1 | Fill #12

## 2018-02-01 MED FILL — DILTIAZEM 24HR ER 300 MG CA: 300 | 90 days supply | Qty: 90 | Fill #0

## 2018-02-05 MED FILL — EZETIMIBE-SIMVASTATIN 10-10: 10-10 | 90 days supply | Qty: 90 | Fill #0

## 2018-02-05 MED FILL — SHIPPING COST: 1 days supply | Qty: 1 | Fill #13

## 2018-02-15 DIAGNOSIS — I10 Essential (primary) hypertension: Secondary | ICD-10-CM | POA: Diagnosis not present

## 2018-02-15 DIAGNOSIS — E559 Vitamin D deficiency, unspecified: Secondary | ICD-10-CM | POA: Diagnosis not present

## 2018-02-15 DIAGNOSIS — E785 Hyperlipidemia, unspecified: Secondary | ICD-10-CM | POA: Diagnosis not present

## 2018-02-16 LAB — COMPLETE METABOLIC PANEL WITH GFR
AG Ratio: 1.5 (calc) (ref 1.0–2.5)
ALBUMIN MSPROF: 4.4 g/dL (ref 3.6–5.1)
ALT: 13 U/L (ref 6–29)
AST: 17 U/L (ref 10–35)
Alkaline phosphatase (APISO): 143 U/L — ABNORMAL HIGH (ref 33–130)
BUN: 12 mg/dL (ref 7–25)
CALCIUM: 10 mg/dL (ref 8.6–10.4)
CO2: 31 mmol/L (ref 20–32)
CREATININE: 0.82 mg/dL (ref 0.50–0.99)
Chloride: 103 mmol/L (ref 98–110)
GFR, EST AFRICAN AMERICAN: 90 mL/min/{1.73_m2} (ref >=60)
GFR, EST NON AFRICAN AMERICAN: 77 mL/min/{1.73_m2} (ref >=60)
GLOBULIN: 2.9 g/dL (ref 1.9–3.7)
GLUCOSE: 85 mg/dL (ref 65–99)
Potassium: 3.9 mmol/L (ref 3.5–5.3)
SODIUM: 141 mmol/L (ref 135–146)
TOTAL PROTEIN: 7.3 g/dL (ref 6.1–8.1)
Total Bilirubin: 0.7 mg/dL (ref 0.2–1.2)

## 2018-02-16 LAB — LIPID PANEL
CHOLESTEROL: 157 mg/dL (ref <200)
HDL: 38 mg/dL — ABNORMAL LOW (ref >50)
LDL CHOLESTEROL (CALC): 99 mg/dL
Non-HDL Cholesterol (Calc): 119 mg/dL (calc) (ref <130)
Total CHOL/HDL Ratio: 4.1 (calc) (ref <5.0)
Triglycerides: 106 mg/dL (ref <150)

## 2018-02-16 LAB — TSH: TSH: 1.4 mIU/L (ref 0.40–4.50)

## 2018-02-16 LAB — VITAMIN D 25 HYDROXY (VIT D DEFICIENCY, FRACTURES): VIT D 25 HYDROXY: 36 ng/mL (ref 30–100)

## 2018-02-18 ENCOUNTER — Ambulatory Visit: Payer: 59 | Admitting: Family Medicine

## 2018-02-18 ENCOUNTER — Other Ambulatory Visit: Payer: Self-pay

## 2018-02-18 ENCOUNTER — Encounter: Payer: Self-pay | Admitting: Family Medicine

## 2018-02-18 VITALS — BP 144/84 | HR 84 | Resp 12 | Ht 63.0 in | Wt 180.0 lb

## 2018-02-18 DIAGNOSIS — R0683 Snoring: Secondary | ICD-10-CM

## 2018-02-18 DIAGNOSIS — Z23 Encounter for immunization: Secondary | ICD-10-CM | POA: Diagnosis not present

## 2018-02-18 DIAGNOSIS — E785 Hyperlipidemia, unspecified: Secondary | ICD-10-CM | POA: Diagnosis not present

## 2018-02-18 DIAGNOSIS — I10 Essential (primary) hypertension: Secondary | ICD-10-CM | POA: Diagnosis not present

## 2018-02-18 DIAGNOSIS — Z9989 Dependence on other enabling machines and devices: Secondary | ICD-10-CM | POA: Insufficient documentation

## 2018-02-18 DIAGNOSIS — G4733 Obstructive sleep apnea (adult) (pediatric): Secondary | ICD-10-CM | POA: Insufficient documentation

## 2018-02-18 MED ORDER — AMLODIPINE BESYLATE 2.5 MG PO TABS: 2.5000 mg | ORAL_TABLET | Freq: Every day | ORAL | 3 refills | Status: DC

## 2018-02-18 MED FILL — AMLODIPINE 2.5 MG TABLET: 2.5 | 90 days supply | Qty: 90 | Fill #0

## 2018-02-18 MED FILL — SHIPPING COST: 1 days supply | Qty: 1 | Fill #14

## 2018-02-18 NOTE — Assessment & Plan Note (Signed)
Hyperlipidemia:Low fat diet discussed and encouraged.   Lipid Panel  Lab Results  Component Value Date   CHOL 157 02/15/2018   HDL 38 (L) 02/15/2018   LDLCALC 99 02/15/2018   TRIG 106 02/15/2018   CHOLHDL 4.1 02/15/2018  Low HDL , needs to commit to regular exercise

## 2018-02-18 NOTE — Patient Instructions (Signed)
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  Thank you  for choosing [facility]. We consider it a privelige to serve you.  Delivering excellent health care in a caring and  compassionate way is our goal.  Partnering with you,  so that together we can achieve this goal is our strategy.

## 2018-02-18 NOTE — Assessment & Plan Note (Signed)
After obtaining informed consent, the vaccine is  administered by LPN.  

## 2018-02-18 NOTE — Assessment & Plan Note (Signed)
Longstanding h/o excessive snoring and even awakens her at times, refer for sleep study

## 2018-02-18 NOTE — Assessment & Plan Note (Signed)
Uncontrolled , add amlofipine 2.5 mg daily DASH diet and commitment to daily physical activity for a minimum of 30 minutes discussed and encouraged, as a part of hypertension management. The importance of attaining a healthy weight is also discussed.  BP/Weight 02/18/2018 10/29/2017 08/08/2017 02/19/2017 12/07/2016 9/48/5462 7/0/3500  Systolic BP 938 182 993 716 967 893 810  Diastolic BP 84 90 72 88 59 82 80  Wt. (Lbs) 180 182 183.8 182.12 - 179 178  BMI 31.89 32.24 32.56 32.26 - 31.71 31.53

## 2018-02-18 NOTE — Assessment & Plan Note (Signed)
improved. Patient re-educated about  the importance of commitment to a  minimum of 150 minutes of exercise per week.  The importance of healthy food choices with portion control discussed. Encouraged to start a food diary, count calories and to consider  joining a support group. Sample diet sheets offered. Goals set by the patient for the next several months.   Weight /BMI 02/18/2018 10/29/2017 08/08/2017  WEIGHT 180 lb 182 lb 183 lb 12.8 oz  HEIGHT 5\' 3"  5\' 3"  5\' 3"   BMI 31.89 kg/m2 32.24 kg/m2 32.56 kg/m2

## 2018-02-18 NOTE — Progress Notes (Signed)
Kristin Hall     MRN: [id number]      DOB: [birth year]   HPI Kristin Hall is here for follow up and re-evaluation of chronic medical conditions, medication management and review of any available recent lab and radiology data.  Preventive health is updated, specifically  Cancer screening and Immunization.   Questions or concerns regarding consultations or procedures which the PT has had in the interim are  addressed. The PT denies any adverse reactions to current medications since the last visit.  Longstanding h/o excessive snoring and having to be shaken to resume breathing   ROS Denies recent fever or chills. Denies sinus pressure, nasal congestion, ear pain or sore throat. Denies chest congestion, productive cough or wheezing. Denies chest pains, palpitations and leg swelling Denies abdominal pain, nausea, vomiting,diarrhea or constipation.   Denies dysuria, frequency, hesitancy or incontinence. Denies joint pain, swelling and limitation in mobility. Denies headaches, seizures, numbness, or tingling. Increased stress with Mother's failing health, however all family members involved in her care. Denies skin break down or rash.   PE  BP (!) 144/84 (BP Location: Right Arm, Patient Position: Sitting, Cuff Size: Large)   Pulse 84   Resp 12   Ht 5\' 3"  (1.6 m)   Wt 180 lb (81.6 kg)   SpO2 95% Comment: room air  BMI 31.89 kg/m   Patient alert and oriented and in no cardiopulmonary distress.  HEENT: No facial asymmetry, EOMI,   oropharynx pink and moist.  Neck supple no JVD, no mass.  Chest: Clear to auscultation bilaterally.  CVS: S1, S2 no murmurs, no S3.Regular rate.  ABD: Soft non tender.   Ext: No edema  MS: Adequate ROM spine, shoulders, hips and knees.  Skin: Intact, no ulcerations or rash noted.  Psych: Good eye contact, normal affect. Memory intact not anxious or depressed appearing.  CNS: CN 2-12 intact, power,  normal throughout.no focal deficits  noted.   Assessment & Plan  Essential hypertension Uncontrolled , add amlofipine 2.5 mg daily DASH diet and commitment to daily physical activity for a minimum of 30 minutes discussed and encouraged, as a part of hypertension management. The importance of attaining a healthy weight is also discussed.  BP/Weight 02/18/2018 10/29/2017 08/08/2017 02/19/2017 12/07/2016 10/02/2016 07/14/2016  Systolic BP 144 138 148 120 118 122 122  Diastolic BP 84 90 72 88 59 82 80  Wt. (Lbs) 180 182 183.8 182.12 - 179 178  BMI 31.89 32.24 32.56 32.26 - 31.71 31.53       Obesity (BMI 30.0-34.9) improved. Patient re-educated about  the importance of commitment to a  minimum of 150 minutes of exercise per week.  The importance of healthy food choices with portion control discussed. Encouraged to start a food diary, count calories and to consider  joining a support group. Sample diet sheets offered. Goals set by the patient for the next several months.   Weight /BMI 02/18/2018 10/29/2017 08/08/2017  WEIGHT 180 lb 182 lb 183 lb 12.8 oz  HEIGHT 5\' 3"  5\' 3"  5\' 3"   BMI 31.89 kg/m2 32.24 kg/m2 32.56 kg/m2      Habitual snoring Longstanding h/o excessive snoring and even awakens her at times, refer for sleep study  Need for immunization against influenza After obtaining informed consent, the vaccine is  administered by LPN.   Hyperlipemia Hyperlipidemia:Low fat diet discussed and encouraged.   Lipid Panel  Lab Results  Component Value Date   CHOL 157 02/15/2018   HDL 38 (L) 02/15/2018  LDLCALC 99 02/15/2018   TRIG 106 02/15/2018   CHOLHDL 4.1 02/15/2018  Low HDL , needs to commit to regular exercise

## 2018-02-25 ENCOUNTER — Other Ambulatory Visit: Payer: Self-pay | Admitting: Cardiovascular Disease

## 2018-03-05 ENCOUNTER — Other Ambulatory Visit: Payer: Self-pay | Admitting: Cardiovascular Disease

## 2018-03-05 MED FILL — SHIPPING COST: 1 days supply | Qty: 1 | Fill #15

## 2018-03-05 MED FILL — POTASSIUM CL ER 20 MEQ TABL: 20 | 90 days supply | Qty: 180 | Fill #0

## 2018-04-01 DIAGNOSIS — I10 Essential (primary) hypertension: Secondary | ICD-10-CM | POA: Diagnosis not present

## 2018-04-01 DIAGNOSIS — R5383 Other fatigue: Secondary | ICD-10-CM | POA: Diagnosis not present

## 2018-04-01 DIAGNOSIS — G4733 Obstructive sleep apnea (adult) (pediatric): Secondary | ICD-10-CM | POA: Diagnosis not present

## 2018-04-01 DIAGNOSIS — E668 Other obesity: Secondary | ICD-10-CM | POA: Diagnosis not present

## 2018-04-08 ENCOUNTER — Other Ambulatory Visit (HOSPITAL_BASED_OUTPATIENT_CLINIC_OR_DEPARTMENT_OTHER): Payer: Self-pay

## 2018-04-08 DIAGNOSIS — G4733 Obstructive sleep apnea (adult) (pediatric): Secondary | ICD-10-CM

## 2018-04-15 MED FILL — OLMESARTAN-HCTZ 40-25 MG TA: 40-25 | 90 days supply | Qty: 90 | Fill #1

## 2018-04-15 MED FILL — SHIPPING COST: 1 days supply | Qty: 1 | Fill #16

## 2018-04-19 ENCOUNTER — Ambulatory Visit: Payer: 59 | Attending: Neurology | Admitting: Neurology

## 2018-04-19 DIAGNOSIS — G4733 Obstructive sleep apnea (adult) (pediatric): Secondary | ICD-10-CM | POA: Diagnosis not present

## 2018-04-22 ENCOUNTER — Ambulatory Visit: Payer: 59 | Admitting: Family Medicine

## 2018-04-29 ENCOUNTER — Other Ambulatory Visit: Payer: Self-pay | Admitting: Family Medicine

## 2018-04-29 DIAGNOSIS — K219 Gastro-esophageal reflux disease without esophagitis: Secondary | ICD-10-CM

## 2018-04-29 MED FILL — PANTOPRAZOLE SOD DR 40 MG T: 40 | 90 days supply | Qty: 90 | Fill #0

## 2018-04-29 MED FILL — SHIPPING COST: 1 days supply | Qty: 1 | Fill #17

## 2018-04-29 MED FILL — DILTIAZEM 24HR ER 300 MG CA: 300 | 90 days supply | Qty: 90 | Fill #0

## 2018-04-29 NOTE — Procedures (Signed)
[facility] Kennita Pavlovich A. Gerilyn Pilgrim, MD     [email or web address]             NOCTURNAL POLYSOMNOGRAPHY   LOCATION: [facility]  Patient Name: Kristin Hall, Kristin Hall Date: 04/19/2018 Gender: Female D.O.B: Aug 06, 1955 Age (years): 67 Referring Provider: Jorge Mandril NP Height (inches): 63 Interpreting Physician: Beryle Beams MD, ABSM Weight (lbs): 183 RPSGT: Peak, Robert BMI: 32 MRN: [id number] Neck Size: 15.50 CLINICAL INFORMATION Sleep Study Type: NPSG     Indication for sleep study: N/A     Epworth Sleepiness Score:     SLEEP STUDY TECHNIQUE As per the AASM Manual for the Scoring of Sleep and Associated Events v2.3 (April 2016) with a hypopnea requiring 4% desaturations.  The channels recorded and monitored were frontal, central and occipital EEG, electrooculogram (EOG), submentalis EMG (chin), nasal and oral airflow, thoracic and abdominal wall motion, anterior tibialis EMG, snore microphone, electrocardiogram, and pulse oximetry.  MEDICATIONS Medications self-administered by patient taken the night of the study : N/A  Current Outpatient Medications:  .  acetaminophen (TYLENOL) 500 MG tablet, Take 1 tablet (500 mg total) by mouth every 8 (eight) hours as needed., Disp: 30 tablet, Rfl: 0 .  amLODipine (NORVASC) 2.5 MG tablet, Take 1 tablet (2.5 mg total) by mouth daily., Disp: 90 tablet, Rfl: 3 .  diltiazem (TIAZAC) 300 MG 24 hr capsule, TAKE 1 CAPSULE BY MOUTH ONCE DAILY, Disp: 90 capsule, Rfl: 1 .  ezetimibe-simvastatin (VYTORIN) 10-10 MG tablet, TAKE 1 TABLET BY MOUTH AT BEDTIME FOR CHOLESTEROL., Disp: 30 tablet, Rfl: 3 .  lubiprostone (AMITIZA) 8 MCG capsule, Take 1 capsule (8 mcg total) by mouth 2 (two) times daily with a meal., Disp: 180 capsule, Rfl: 3 .  Multiple Vitamins-Minerals (ALIVE WOMENS 50+) TABS, Take 1 tablet by mouth daily., Disp: , Rfl:  .  olmesartan-hydrochlorothiazide (BENICAR HCT) 40-25 MG tablet, TAKE 1 TABLET BY MOUTH DAILY.,  Disp: 90 tablet, Rfl: 2 .  pantoprazole (PROTONIX) 40 MG tablet, TAKE 1 TABLET BY MOUTH DAILY., Disp: 90 tablet, Rfl: PRN .  potassium chloride SA (K-DUR,KLOR-CON) 20 MEQ tablet, Take 2 tablets (40 mEq total) by mouth daily., Disp: 180 tablet, Rfl: 0 .  potassium chloride SA (K-DUR,KLOR-CON) 20 MEQ tablet, TAKE 2 TABLETS BY MOUTH DAILY, Disp: 180 tablet, Rfl: 0     SLEEP ARCHITECTURE The study was initiated at 9:20:07 PM and ended at 4:06:57 AM.  Sleep onset time was 76.7 minutes and the sleep efficiency was 65.3%%. The total sleep time was 265.5 minutes.  Stage REM latency was 84.0 minutes.  The patient spent 7.5%% of the night in stage N1 sleep, 70.2%% in stage N2 sleep, 0.0%% in stage N3 and 22.2% in REM.  Alpha intrusion was absent.  Supine sleep was 0.00%.  RESPIRATORY PARAMETERS The overall apnea/hypopnea index (AHI) was 27.3 per hour. There were 27 total apneas, including 22 obstructive, 5 central and 0 mixed apneas. There were 94 hypopneas and 27 RERAs.  The AHI during Stage REM sleep was 60.0 per hour.  AHI while supine was N/A per hour.  The mean oxygen saturation was 92.2%. The minimum SpO2 during sleep was 76.0%.  loud snoring was noted during this study.  CARDIAC DATA The 2 lead EKG demonstrated sinus rhythm. The mean heart rate was 61.9 beats per minute. Other EKG findings include: None.  LEG MOVEMENT DATA The total PLMS were 0 with a resulting PLMS index of 0.0. Associated arousal with leg movement index was 0.0.  IMPRESSIONS 1. Moderate obstructive sleep  apnea worse during REM sleep is documented. 2. Absent slow wave sleep is noted.  Argie Ramming, MD Diplomate, American Board of Sleep Medicine.  ELECTRONICALLY SIGNED ON:  04/29/2018, 9:40 AM  [facility] PH: [phone]   FX: [phone] ACCREDITED BY THE AMERICAN ACADEMY OF SLEEP MEDICINE

## 2018-05-13 DIAGNOSIS — G4733 Obstructive sleep apnea (adult) (pediatric): Secondary | ICD-10-CM | POA: Diagnosis not present

## 2018-05-19 MED FILL — AMLODIPINE 2.5 MG TABLET: 2.5 | 90 days supply | Qty: 90 | Fill #1

## 2018-05-20 MED FILL — SHIPPING COST: 1 days supply | Qty: 1 | Fill #18

## 2018-05-27 ENCOUNTER — Encounter: Payer: Self-pay | Admitting: Family Medicine

## 2018-05-27 ENCOUNTER — Ambulatory Visit: Payer: 59 | Admitting: Family Medicine

## 2018-05-27 VITALS — BP 118/82 | HR 99 | Resp 15 | Ht 63.0 in | Wt 179.0 lb

## 2018-05-27 DIAGNOSIS — Z9989 Dependence on other enabling machines and devices: Secondary | ICD-10-CM | POA: Diagnosis not present

## 2018-05-27 DIAGNOSIS — I10 Essential (primary) hypertension: Secondary | ICD-10-CM | POA: Diagnosis not present

## 2018-05-27 DIAGNOSIS — R748 Abnormal levels of other serum enzymes: Secondary | ICD-10-CM | POA: Diagnosis not present

## 2018-05-27 DIAGNOSIS — E785 Hyperlipidemia, unspecified: Secondary | ICD-10-CM

## 2018-05-27 DIAGNOSIS — E669 Obesity, unspecified: Secondary | ICD-10-CM | POA: Diagnosis not present

## 2018-05-27 DIAGNOSIS — G4733 Obstructive sleep apnea (adult) (pediatric): Secondary | ICD-10-CM | POA: Diagnosis not present

## 2018-05-27 MED ORDER — POTASSIUM CHLORIDE CRYS ER 20 MEQ PO TBCR: 40.0000 meq | EXTENDED_RELEASE_TABLET | Freq: Every day | ORAL | 1 refills | Status: DC

## 2018-05-27 MED FILL — POTASSIUM CHLORIDE CRYS ER: 20 | 90 days supply | Qty: 180 | Fill #0

## 2018-05-27 MED FILL — SHIPPING COST: 1 days supply | Qty: 1 | Fill #19

## 2018-05-27 NOTE — Patient Instructions (Addendum)
Physical exam with MD due end May, call if you need me before   Excellent blood pressure   Please work on mindful eating , cut out the sweet tea and commit to 15 minutes of exeercise two times daily for at least 5 days per week  CBC, fasting lipid amp and EGFR, Alk phos level to be checked  1 week before May visit  Thanks for choosing [facility], we consider it a privelige to serve you.

## 2018-05-30 NOTE — Assessment & Plan Note (Signed)
Encouraged and advised to use CPAP daily, states she realizes the benefit with it

## 2018-05-30 NOTE — Assessment & Plan Note (Signed)
Unchanged Patient re-educated about  the importance of commitment to a  minimum of 150 minutes of exercise per week.  The importance of healthy food choices with portion control discussed. Encouraged to start a food diary, count calories and to consider  joining a support group. Sample diet sheets offered. Goals set by the patient for the next several months.   Weight /BMI 05/27/2018 02/18/2018 10/29/2017  WEIGHT 179 lb 180 lb 182 lb  HEIGHT 5\' 3"  5\' 3"  5\' 3"   BMI 31.71 kg/m2 31.89 kg/m2 32.24 kg/m2

## 2018-05-30 NOTE — Progress Notes (Signed)
Kristin Hall     MRN: [id number]      DOB: [birth year]   HPI Kristin Hall is here for follow up and re-evaluation of chronic medical conditions, medication management and review of any available recent lab and radiology data.  Preventive health is updated, specifically  Cancer screening and Immunization.   Questions or concerns regarding consultations or procedures which the PT has had in the interim are  addressed. The PT denies any adverse reactions to current medications since the last visit.  There are no new concerns.  There are no specific complaints   ROS Denies recent fever or chills. Denies sinus pressure, nasal congestion, ear pain or sore throat. Denies chest congestion, productive cough or wheezing. Denies chest pains, palpitations and leg swelling Denies abdominal pain, nausea, vomiting,diarrhea or constipation.   Denies dysuria, frequency, hesitancy or incontinence. Denies joint pain, swelling and limitation in mobility. Denies headaches, seizures, numbness, or tingling. Denies depression, anxiety or insomnia. Denies skin break down or rash.   PE  BP 118/82   Pulse 99   Resp 15   Ht 5\' 3"  (1.6 m)   Wt 179 lb (81.2 kg)   SpO2 96%   BMI 31.71 kg/m   Patient alert and oriented and in no cardiopulmonary distress.  HEENT: No facial asymmetry, EOMI,   oropharynx pink and moist.  Neck supple no JVD, no mass.  Chest: Clear to auscultation bilaterally.  CVS: S1, S2 no murmurs, no S3.Regular rate.  ABD: Soft non tender.   Ext: No edema  MS: Adequate ROM spine, shoulders, hips and knees.  Skin: Intact, no ulcerations or rash noted.  Psych: Good eye contact, normal affect. Memory intact not anxious or depressed appearing.  CNS: CN 2-12 intact, power,  normal throughout.no focal deficits noted.   Assessment & Plan  Essential hypertension Controlled, no change in medication DASH diet and commitment to daily physical activity for a minimum of 30 minutes  discussed and encouraged, as a part of hypertension management. The importance of attaining a healthy weight is also discussed.  BP/Weight 05/27/2018 02/18/2018 10/29/2017 08/08/2017 02/19/2017 12/07/2016 10/02/2016  Systolic BP 118 144 138 148 120 118 122  Diastolic BP 82 84 90 72 88 59 82  Wt. (Lbs) 179 180 182 183.8 182.12 - 179  BMI 31.71 31.89 32.24 32.56 32.26 - 31.71       Hyperlipemia Hyperlipidemia:Low fat diet discussed and encouraged.   Lipid Panel  Lab Results  Component Value Date   CHOL 157 02/15/2018   HDL 38 (L) 02/15/2018   LDLCALC 99 02/15/2018   TRIG 106 02/15/2018   CHOLHDL 4.1 02/15/2018     Updated lab needed at/ before next visit.   Obesity (BMI 30.0-34.9) Unchanged Patient re-educated about  the importance of commitment to a  minimum of 150 minutes of exercise per week.  The importance of healthy food choices with portion control discussed. Encouraged to start a food diary, count calories and to consider  joining a support group. Sample diet sheets offered. Goals set by the patient for the next several months.   Weight /BMI 05/27/2018 02/18/2018 10/29/2017  WEIGHT 179 lb 180 lb 182 lb  HEIGHT 5\' 3"  5\' 3"  5\' 3"   BMI 31.71 kg/m2 31.89 kg/m2 32.24 kg/m2      Elevated alkaline phosphatase level rept lab ordered for next visit, states no longer being followed by GI  OSA on CPAP Encouraged and advised to use CPAP daily, states she realizes the benefit with  it

## 2018-05-30 NOTE — Assessment & Plan Note (Signed)
Controlled, no change in medication DASH diet and commitment to daily physical activity for a minimum of 30 minutes discussed and encouraged, as a part of hypertension management. The importance of attaining a healthy weight is also discussed.  BP/Weight 05/27/2018 02/18/2018 10/29/2017 08/08/2017 02/19/2017 12/07/2016 9/82/6415  Systolic BP 830 940 768 088 110 315 945  Diastolic BP 82 84 90 72 88 59 82  Wt. (Lbs) 179 180 182 183.8 182.12 - 179  BMI 31.71 31.89 32.24 32.56 32.26 - 31.71

## 2018-05-30 NOTE — Assessment & Plan Note (Signed)
Hyperlipidemia:Low fat diet discussed and encouraged.   Lipid Panel  Lab Results  Component Value Date   CHOL 157 02/15/2018   HDL 38 (L) 02/15/2018   LDLCALC 99 02/15/2018   TRIG 106 02/15/2018   CHOLHDL 4.1 02/15/2018     Updated lab needed at/ before next visit.

## 2018-05-30 NOTE — Assessment & Plan Note (Signed)
rept lab ordered for next visit, states no longer being followed by GI

## 2018-06-13 DIAGNOSIS — G4733 Obstructive sleep apnea (adult) (pediatric): Secondary | ICD-10-CM | POA: Diagnosis not present

## 2018-07-13 DIAGNOSIS — G4733 Obstructive sleep apnea (adult) (pediatric): Secondary | ICD-10-CM | POA: Diagnosis not present

## 2018-07-15 MED FILL — OLMESARTAN-HCTZ 40-25 MG TA: 40-25 | 90 days supply | Qty: 90 | Fill #2

## 2018-07-19 MED FILL — SHIPPING COST: 1 days supply | Qty: 1 | Fill #0

## 2018-07-22 MED FILL — PANTOPRAZOLE SOD DR 40 MG T: 40 | 90 days supply | Qty: 90 | Fill #1

## 2018-07-22 MED FILL — DILTIAZEM 24HR ER 300 MG CA: 300 | 90 days supply | Qty: 90 | Fill #1

## 2018-08-09 MED FILL — AMLODIPINE 2.5 MG TABLET: 2.5 | 90 days supply | Qty: 90 | Fill #2

## 2018-08-11 DIAGNOSIS — G4733 Obstructive sleep apnea (adult) (pediatric): Secondary | ICD-10-CM | POA: Diagnosis not present

## 2018-08-14 DIAGNOSIS — G4733 Obstructive sleep apnea (adult) (pediatric): Secondary | ICD-10-CM | POA: Diagnosis not present

## 2018-08-19 MED FILL — POTASSIUM CHLORIDE CRYS ER: 20 | 90 days supply | Qty: 180 | Fill #1

## 2018-08-19 MED FILL — EZETIMIBE-SIMVASTATIN 10-10: 10-10 | 30 days supply | Qty: 30 | Fill #1

## 2018-10-11 ENCOUNTER — Other Ambulatory Visit: Payer: Self-pay | Admitting: Family Medicine

## 2018-10-11 MED FILL — OLMESARTAN-HCTZ 40-25 MG TA: 40-25 | 30 days supply | Qty: 30 | Fill #0

## 2018-10-14 ENCOUNTER — Other Ambulatory Visit (HOSPITAL_COMMUNITY)
Admission: RE | Admit: 2018-10-14 | Discharge: 2018-10-14 | Disposition: A | Discharge status: Home / Self Care | Payer: 59 | Source: Ambulatory Visit | Attending: Family Medicine | Admitting: Family Medicine

## 2018-10-14 DIAGNOSIS — I10 Essential (primary) hypertension: Secondary | ICD-10-CM | POA: Diagnosis not present

## 2018-10-14 LAB — COMPREHENSIVE METABOLIC PANEL
ALT: 16 U/L (ref 0–44)
AST: 22 U/L (ref 15–41)
Albumin: 4 g/dL (ref 3.5–5.0)
Alkaline Phosphatase: 137 U/L — ABNORMAL HIGH (ref 38–126)
Anion gap: 13 (ref 5–15)
BUN: 9 mg/dL (ref 8–23)
CO2: 27 mmol/L (ref 22–32)
Calcium: 9.5 mg/dL (ref 8.9–10.3)
Chloride: 104 mmol/L (ref 98–111)
Creatinine, Ser: 0.75 mg/dL (ref 0.44–1.00)
GFR calc Af Amer: >60 mL/min (ref >60)
GFR calc non Af Amer: >60 mL/min (ref >60)
Glucose, Bld: 97 mg/dL (ref 70–99)
Potassium: 3.6 mmol/L (ref 3.5–5.1)
Sodium: 144 mmol/L (ref 135–145)
Total Bilirubin: 0.8 mg/dL (ref 0.3–1.2)
Total Protein: 7.8 g/dL (ref 6.5–8.1)

## 2018-10-14 LAB — LIPID PANEL
Cholesterol: 147 mg/dL (ref 0–200)
HDL: 39 mg/dL — ABNORMAL LOW (ref >40)
LDL Cholesterol: 92 mg/dL (ref 0–99)
Total CHOL/HDL Ratio: 3.8 RATIO
Triglycerides: 80 mg/dL (ref <150)
VLDL: 16 mg/dL (ref 0–40)

## 2018-10-14 LAB — CBC
HCT: 40 % (ref 36.0–46.0)
Hemoglobin: 12.7 g/dL (ref 12.0–15.0)
MCH: 28.9 pg (ref 26.0–34.0)
MCHC: 31.8 g/dL (ref 30.0–36.0)
MCV: 90.9 fL (ref 80.0–100.0)
Platelets: 395 10*3/uL (ref 150–400)
RBC: 4.4 MIL/uL (ref 3.87–5.11)
RDW: 12.8 % (ref 11.5–15.5)
WBC: 9.6 10*3/uL (ref 4.0–10.5)
nRBC: 0 % (ref 0.0–0.2)

## 2018-10-28 ENCOUNTER — Other Ambulatory Visit: Payer: Self-pay | Admitting: Family Medicine

## 2018-10-28 MED FILL — PANTOPRAZOLE SOD DR 40 MG T: 40 | 90 days supply | Qty: 90 | Fill #2

## 2018-10-28 MED FILL — DILTIAZEM 24HR ER 300 MG CA: 300 | 90 days supply | Qty: 90 | Fill #0

## 2018-11-08 DIAGNOSIS — H52223 Regular astigmatism, bilateral: Secondary | ICD-10-CM | POA: Diagnosis not present

## 2018-11-11 ENCOUNTER — Other Ambulatory Visit (HOSPITAL_COMMUNITY)
Admission: RE | Admit: 2018-11-11 | Discharge: 2018-11-11 | Disposition: A | Discharge status: Home / Self Care | Payer: 59 | Source: Ambulatory Visit | Attending: Family Medicine | Admitting: Family Medicine

## 2018-11-11 ENCOUNTER — Ambulatory Visit (INDEPENDENT_AMBULATORY_CARE_PROVIDER_SITE_OTHER): Payer: 59 | Admitting: Family Medicine

## 2018-11-11 ENCOUNTER — Other Ambulatory Visit: Payer: Self-pay

## 2018-11-11 ENCOUNTER — Encounter: Payer: Self-pay | Admitting: Family Medicine

## 2018-11-11 VITALS — BP 122/82 | HR 95 | Temp 98.5°F | Resp 14 | Ht 63.0 in | Wt 180.0 lb

## 2018-11-11 DIAGNOSIS — Z124 Encounter for screening for malignant neoplasm of cervix: Secondary | ICD-10-CM

## 2018-11-11 DIAGNOSIS — Z0000 Encounter for general adult medical examination without abnormal findings: Secondary | ICD-10-CM

## 2018-11-11 MED ORDER — UNABLE TO FIND: 0 refills | Status: DC

## 2018-11-11 MED ORDER — AMLODIPINE BESYLATE 2.5 MG PO TABS: 2.5000 mg | ORAL_TABLET | Freq: Every day | ORAL | 3 refills | Status: DC

## 2018-11-11 MED ORDER — EZETIMIBE-SIMVASTATIN 10-10 MG PO TABS: ORAL_TABLET | ORAL | 3 refills | Status: DC

## 2018-11-11 MED ORDER — OLMESARTAN MEDOXOMIL-HCTZ 40-25 MG PO TABS: 1.0000 | ORAL_TABLET | Freq: Every day | ORAL | 3 refills | Status: DC

## 2018-11-11 MED ORDER — POTASSIUM CHLORIDE CRYS ER 20 MEQ PO TBCR: 40.0000 meq | EXTENDED_RELEASE_TABLET | Freq: Every day | ORAL | 1 refills | Status: DC

## 2018-11-11 MED ORDER — DILTIAZEM HCL ER BEADS 300 MG PO CP24: 300.0000 mg | ORAL_CAPSULE | Freq: Every day | ORAL | 3 refills | Status: DC

## 2018-11-11 MED FILL — EZETIMIBE-SIMVASTATIN 10-10: 10-10 | 90 days supply | Qty: 90 | Fill #0

## 2018-11-11 MED FILL — POTASSIUM CHLORIDE CRYS ER: 20 | 90 days supply | Qty: 180 | Fill #0

## 2018-11-11 MED FILL — OLMESARTAN-HCTZ 40-25 MG TA: 40-25 | 90 days supply | Qty: 90 | Fill #0

## 2018-11-11 MED FILL — AMLODIPINE 2.5 MG TABLET: 2.5 | 90 days supply | Qty: 90 | Fill #0

## 2018-11-11 MED FILL — SM BLOOD PRESSURE MONITOR: 30 days supply | Qty: 1 | Fill #0

## 2018-11-11 NOTE — Progress Notes (Signed)
Kristin Hall     MRN: [id number]      DOB: 1956-01-06  HPI: Patient is in for annual physical exam. Loose stool today due to eating cantaloupe  Recent labs, if available are reviewed. Immunization is reviewed , and  updated if needed.   PE: BP 122/82   Pulse 95   Temp 98.5 F (36.9 C) (Oral)   Resp 14   Ht 5\' 3"  (1.6 m)   Wt 180 lb (81.6 kg)   SpO2 97%   BMI 31.89 kg/m   Pleasant  female, alert and oriented x 3, in no cardio-pulmonary distress. Afebrile. HEENT No facial trauma or asymetry. Sinuses non tender.  Extra occullar muscles intact, pupils equally reactive to light. External ears normal, tympanic membranes clear. Oropharynx moist, no exudate. Neck: supple, no adenopathy,JVD or thyromegaly.No bruits.  Chest: Clear to ascultation bilaterally.No crackles or wheezes. Non tender to palpation  Breast: No asymetry,no masses or lumps. No tenderness. No nipple discharge or inversion. No axillary or supraclavicular adenopathy  Cardiovascular system; Heart sounds normal,  S1 and  S2 ,no S3.  No murmur, or thrill. Apical beat not displaced Peripheral pulses normal.  Abdomen: Soft, diffuse superficial mild tenderness, no organomegaly or masses. No bruits. Bowel sounds normal. No guarding, tenderness or rebound.   GU: External genitalia normal female genitalia , normal female distribution of hair. No lesions. Urethral meatus normal in size, no  Prolapse, no lesions visibly  Present. Bladder non tender. Vagina pink and moist , with no visible lesions , discharge present . Adequate pelvic support no  cystocele or rectocele noted Uterusabsent, no adnexal masses, no cervical motion or adnexal tenderness.   Musculoskeletal exam: Full ROM of spine, hips , shoulders and knees. No deformity ,swelling or crepitus noted. No muscle wasting or atrophy.   Neurologic: Cranial nerves 2 to 12 intact. Power, tone ,sensation and reflexes normal throughout. No  disturbance in gait. No tremor.  Skin: Intact, no ulceration, erythema , scaling or rash noted. Pigmentation normal throughout  Psych; Normal mood and affect. Judgement and concentration normal   Assessment & Plan:  Annual physical exam Annual exam as documented. Counseling done  re healthy lifestyle involving commitment to 150 minutes exercise per week, heart healthy diet, and attaining healthy weight.The importance of adequate sleep also discussed. Regular seat belt use and home safety, is also discussed. Changes in health habits are decided on by the patient with goals and time frames  set for achieving them. Immunization and cancer screening needs are specifically addressed at this visit.

## 2018-11-11 NOTE — Patient Instructions (Addendum)
F/u first week in December, call if you need me sooner  Fasting lipid, cmp and eGFR, tSH and vit D last week of Novemeber  Medications are refilled, and blood pressure cuffi is ordered  It is important that you exercise regularly at least 30 minutes 5 times a week. If you develop chest pain, have severe difficulty breathing, or feel very tired, stop exercising immediately and seek medical attention  Think about what you will eat, plan ahead. Choose " clean, green, fresh or frozen" over canned, processed or packaged foods which are more sugary, salty and fatty. 70 to 75% of food eaten should be vegetables and fruit. Three meals at set times with snacks allowed between meals, but they must be fruit or vegetables. Aim to eat over a 12 hour period , example 7 am to 7 pm, and STOP after  your last meal of the day. Drink water,generally about 64 ounces per day, no other drink is as healthy. Fruit juice is best enjoyed in a healthy way, by EATING the fruit.  Social distancing.maintain a  6 ft distance and avoid crowds Frequent hand washing with soap and water Keeping your hands off of your face.Wear a face mask These 3 practices will help to keep both you and your community healthy during this time. Please practice them faithfully!    Thanks for choosing [facility], we consider it a privelige to serve you.

## 2018-11-11 NOTE — Addendum Note (Signed)
Addended by: Eual Fines on: 11/11/2018 10:08 AM   Modules accepted: Orders

## 2018-11-11 NOTE — Assessment & Plan Note (Signed)

## 2018-11-13 ENCOUNTER — Encounter: Payer: Self-pay | Admitting: Family Medicine

## 2018-11-13 LAB — CYTOLOGY - PAP
Diagnosis: NEGATIVE
HPV: NOT DETECTED

## 2018-11-22 DIAGNOSIS — G4733 Obstructive sleep apnea (adult) (pediatric): Secondary | ICD-10-CM | POA: Diagnosis not present

## 2018-12-10 ENCOUNTER — Other Ambulatory Visit (HOSPITAL_COMMUNITY): Payer: Self-pay | Admitting: Family Medicine

## 2018-12-10 DIAGNOSIS — Z1231 Encounter for screening mammogram for malignant neoplasm of breast: Secondary | ICD-10-CM

## 2018-12-20 ENCOUNTER — Other Ambulatory Visit: Payer: Self-pay

## 2018-12-20 ENCOUNTER — Ambulatory Visit (HOSPITAL_COMMUNITY)
Admission: RE | Admit: 2018-12-20 | Discharge: 2018-12-20 | Disposition: A | Discharge status: Home / Self Care | Payer: 59 | Source: Ambulatory Visit | Attending: Family Medicine | Admitting: Family Medicine

## 2018-12-20 DIAGNOSIS — Z1231 Encounter for screening mammogram for malignant neoplasm of breast: Secondary | ICD-10-CM | POA: Insufficient documentation

## 2019-01-28 MED FILL — PANTOPRAZOLE SOD DR 40 MG T: 40 | 90 days supply | Qty: 90 | Fill #0

## 2019-01-28 MED FILL — DILTIAZEM HCL ER 300 MG CAP: 300 | 90 days supply | Qty: 90 | Fill #0

## 2019-02-11 MED FILL — OLMESARTAN-HCTZ 40-25 MG TA: 40-25 | 90 days supply | Qty: 90 | Fill #1

## 2019-02-11 MED FILL — AMLODIPINE 2.5 MG TABLET: 2.5 | 90 days supply | Qty: 90 | Fill #1

## 2019-02-13 DIAGNOSIS — H40003 Preglaucoma, unspecified, bilateral: Secondary | ICD-10-CM | POA: Diagnosis not present

## 2019-02-24 MED FILL — POTASSIUM CHLORIDE CRYS ER: 20 | 90 days supply | Qty: 180 | Fill #1

## 2019-04-21 ENCOUNTER — Ambulatory Visit (INDEPENDENT_AMBULATORY_CARE_PROVIDER_SITE_OTHER): Payer: 59 | Admitting: Family Medicine

## 2019-04-21 ENCOUNTER — Encounter: Payer: Self-pay | Admitting: Family Medicine

## 2019-04-21 ENCOUNTER — Other Ambulatory Visit: Payer: Self-pay

## 2019-04-21 VITALS — BP 122/82 | Ht 63.0 in | Wt 180.0 lb

## 2019-04-21 DIAGNOSIS — E785 Hyperlipidemia, unspecified: Secondary | ICD-10-CM

## 2019-04-21 DIAGNOSIS — I10 Essential (primary) hypertension: Secondary | ICD-10-CM

## 2019-04-21 DIAGNOSIS — E559 Vitamin D deficiency, unspecified: Secondary | ICD-10-CM | POA: Diagnosis not present

## 2019-04-21 DIAGNOSIS — E669 Obesity, unspecified: Secondary | ICD-10-CM | POA: Diagnosis not present

## 2019-04-21 MED ORDER — EZETIMIBE-SIMVASTATIN 10-10 MG PO TABS: ORAL_TABLET | ORAL | 1 refills | Status: DC

## 2019-04-21 MED ORDER — POTASSIUM CHLORIDE CRYS ER 20 MEQ PO TBCR: 40.0000 meq | EXTENDED_RELEASE_TABLET | Freq: Every day | ORAL | 1 refills | Status: DC

## 2019-04-21 MED FILL — EZETIMIBE-SIMVASTATIN 10-10: 10-10 | 30 days supply | Qty: 30 | Fill #0

## 2019-04-21 MED FILL — DILTIAZEM HCL ER 300 MG CAP: 300 | 90 days supply | Qty: 90 | Fill #1

## 2019-04-21 NOTE — Patient Instructions (Signed)
Annual physical exam in office with MD first week in July, call if you need me before  Fasting lipid, cmp and eGFr, tSH and vit d at hospital this week please  It is important that you exercise regularly at least 30 minutes 5 times a week. If you develop chest pain, have severe difficulty breathing, or feel very tired, stop exercising immediately and seek medical attention   Think about what you will eat, plan ahead. Choose " clean, green, fresh or frozen" over canned, processed or packaged foods which are more sugary, salty and fatty. 70 to 75% of food eaten should be vegetables and fruit. Three meals at set times with snacks allowed between meals, but they must be fruit or vegetables. Aim to eat over a 12 hour period , example 7 am to 7 pm, and STOP after  your last meal of the day. Drink water,generally about 64 ounces per day, no other drink is as healthy. Fruit juice is best enjoyed in a healthy way, by EATING the fruit.   Thanks for choosing [facility], we consider it a privelige to serve you.

## 2019-04-21 NOTE — Assessment & Plan Note (Signed)
Controlled, no change in medication DASH diet and commitment to daily physical activity for a minimum of 30 minutes discussed and encouraged, as a part of hypertension management. The importance of attaining a healthy weight is also discussed.  BP/Weight 04/21/2019 11/11/2018 05/27/2018 02/18/2018 10/29/2017 08/08/2017 [id number]  Systolic BP 123XX123 123XX123 123456 123456 0000000 123456 123456  Diastolic BP 82 82 82 84 90 72 88  Wt. (Lbs) 180 180 179 180 182 183.8 182.12  BMI 31.89 31.89 31.71 31.89 32.24 32.56 32.26

## 2019-04-21 NOTE — Assessment & Plan Note (Signed)
  Patient re-educated about  the importance of commitment to a  minimum of 150 minutes of exercise per week as able.  The importance of healthy food choices with portion control discussed, as well as eating regularly and within a 12 hour window most days. The need to choose "clean , green" food 50 to 75% of the time is discussed, as well as to make water the primary drink and set a goal of 64 ounces water daily.    Weight /BMI 04/21/2019 11/11/2018 05/27/2018  WEIGHT 180 lb 180 lb 179 lb  HEIGHT 5\' 3"  5\' 3"  5\' 3"   BMI 31.89 kg/m2 31.89 kg/m2 31.71 kg/m2

## 2019-04-21 NOTE — Assessment & Plan Note (Signed)
Hyperlipidemia:Low fat diet discussed and encouraged.   Lipid Panel  Lab Results  Component Value Date   CHOL 147 10/14/2018   HDL 39 (L) 10/14/2018   LDLCALC 92 10/14/2018   TRIG 80 10/14/2018   CHOLHDL 3.8 10/14/2018     Needs to increase exercise  Updated lab needed at/ before next visit.

## 2019-04-21 NOTE — Progress Notes (Signed)
Virtual Visit via Telephone Note  I connected with Cleda Daub on 04/21/19 at  8:40 AM EST by telephone and verified that I am speaking with the correct person using two identifiers.  Location: Patient:home Provider: OFFICE   I discussed the limitations, risks, security and privacy concerns of performing an evaluation and management service by telephone and the availability of in person appointments. I also discussed with the patient that there may be a patient responsible charge related to this service. The patient expressed understanding and agreed to proceed.   History of Present Illness:  Denies recent fever or chills. Denies sinus pressure, nasal congestion, ear pain or sore throat. Denies chest congestion, productive cough or wheezing. Denies chest pains, palpitations and leg swelling Denies abdominal pain, nausea, vomiting,diarrhea or constipation.   Denies dysuria, frequency, hesitancy or incontinence. Denies joint pain, swelling and limitation in mobility. Denies headaches, seizures, numbness, or tingling. Denies depression, anxiety or insomnia. Denies skin break down or rash.       Observations/Objective: BP 122/82   Ht 5\' 3"  (1.6 m)   Wt 180 lb (81.6 kg)   BMI 31.89 kg/m  Good communication with no confusion and intact memory. Alert and oriented x 3 No signs of respiratory distress during speech    Assessment and Plan:  Essential hypertension Controlled, no change in medication DASH diet and commitment to daily physical activity for a minimum of 30 minutes discussed and encouraged, as a part of hypertension management. The importance of attaining a healthy weight is also discussed.  BP/Weight 04/21/2019 11/11/2018 05/27/2018 02/18/2018 10/29/2017 08/08/2017 [id number]  Systolic BP 123XX123 123XX123 123456 123456 0000000 123456 123456  Diastolic BP 82 82 82 84 90 72 88  Wt. (Lbs) 180 180 179 180 182 183.8 182.12  BMI 31.89 31.89 31.71 31.89 32.24 32.56 32.26        Hyperlipemia Hyperlipidemia:Low fat diet discussed and encouraged.   Lipid Panel  Lab Results  Component Value Date   CHOL 147 10/14/2018   HDL 39 (L) 10/14/2018   LDLCALC 92 10/14/2018   TRIG 80 10/14/2018   CHOLHDL 3.8 10/14/2018     Needs to increase exercise  Updated lab needed at/ before next visit.   Obesity (BMI 30.0-34.9)  Patient re-educated about  the importance of commitment to a  minimum of 150 minutes of exercise per week as able.  The importance of healthy food choices with portion control discussed, as well as eating regularly and within a 12 hour window most days. The need to choose "clean , green" food 50 to 75% of the time is discussed, as well as to make water the primary drink and set a goal of 64 ounces water daily.    Weight /BMI 04/21/2019 11/11/2018 05/27/2018  WEIGHT 180 lb 180 lb 179 lb  HEIGHT 5\' 3"  5\' 3"  5\' 3"   BMI 31.89 kg/m2 31.89 kg/m2 31.71 kg/m2       Follow Up Instructions:    I discussed the assessment and treatment plan with the patient. The patient was provided an opportunity to ask questions and all were answered. The patient agreed with the plan and demonstrated an understanding of the instructions.   The patient was advised to call back or seek an in-person evaluation if the symptoms worsen or if the condition fails to improve as anticipated.  I provided 18 minutes of non-face-to-face time during this encounter.   Tula Nakayama, MD

## 2019-04-25 ENCOUNTER — Other Ambulatory Visit: Payer: Self-pay

## 2019-04-25 ENCOUNTER — Other Ambulatory Visit (HOSPITAL_COMMUNITY)
Admission: RE | Admit: 2019-04-25 | Discharge: 2019-04-25 | Disposition: A | Discharge status: Home / Self Care | Payer: 59 | Source: Ambulatory Visit | Attending: Family Medicine | Admitting: Family Medicine

## 2019-04-25 ENCOUNTER — Encounter: Payer: Self-pay | Admitting: Family Medicine

## 2019-04-25 DIAGNOSIS — I10 Essential (primary) hypertension: Secondary | ICD-10-CM | POA: Insufficient documentation

## 2019-04-25 DIAGNOSIS — E559 Vitamin D deficiency, unspecified: Secondary | ICD-10-CM | POA: Diagnosis not present

## 2019-04-25 DIAGNOSIS — E785 Hyperlipidemia, unspecified: Secondary | ICD-10-CM | POA: Insufficient documentation

## 2019-04-25 LAB — COMPREHENSIVE METABOLIC PANEL
ALT: 20 U/L (ref 0–44)
AST: 21 U/L (ref 15–41)
Albumin: 4.3 g/dL (ref 3.5–5.0)
Alkaline Phosphatase: 152 U/L — ABNORMAL HIGH (ref 38–126)
Anion gap: 13 (ref 5–15)
BUN: 15 mg/dL (ref 8–23)
CO2: 28 mmol/L (ref 22–32)
Calcium: 9.5 mg/dL (ref 8.9–10.3)
Chloride: 103 mmol/L (ref 98–111)
Creatinine, Ser: 0.79 mg/dL (ref 0.44–1.00)
GFR calc Af Amer: >60 mL/min (ref >60)
GFR calc non Af Amer: >60 mL/min (ref >60)
Glucose, Bld: 102 mg/dL — ABNORMAL HIGH (ref 70–99)
Potassium: 3.5 mmol/L (ref 3.5–5.1)
Sodium: 144 mmol/L (ref 135–145)
Total Bilirubin: 0.7 mg/dL (ref 0.3–1.2)
Total Protein: 8.1 g/dL (ref 6.5–8.1)

## 2019-04-25 LAB — LIPID PANEL
Cholesterol: 156 mg/dL (ref 0–200)
HDL: 40 mg/dL — ABNORMAL LOW (ref >40)
LDL Cholesterol: 98 mg/dL (ref 0–99)
Total CHOL/HDL Ratio: 3.9 RATIO
Triglycerides: 88 mg/dL (ref <150)
VLDL: 18 mg/dL (ref 0–40)

## 2019-04-25 LAB — VITAMIN D 25 HYDROXY (VIT D DEFICIENCY, FRACTURES): Vit D, 25-Hydroxy: 29.61 ng/mL — ABNORMAL LOW (ref 30–100)

## 2019-04-25 LAB — TSH: TSH: 1.45 u[IU]/mL (ref 0.350–4.500)

## 2019-04-28 MED FILL — PANTOPRAZOLE SOD DR 40 MG T: 40 | 90 days supply | Qty: 90 | Fill #1

## 2019-05-06 MED FILL — OLMESARTAN-HCTZ 40-25 MG TA: 40-25 | 90 days supply | Qty: 90 | Fill #2

## 2019-05-06 MED FILL — AMLODIPINE 2.5 MG TABLET: 2.5 | 90 days supply | Qty: 90 | Fill #2

## 2019-05-19 ENCOUNTER — Encounter: Payer: Self-pay | Admitting: Family Medicine

## 2019-06-02 MED FILL — POTASSIUM CHLORIDE CRYS ER: 20 | 90 days supply | Qty: 180 | Fill #0

## 2019-07-28 ENCOUNTER — Other Ambulatory Visit: Payer: Self-pay | Admitting: Family Medicine

## 2019-07-28 DIAGNOSIS — K219 Gastro-esophageal reflux disease without esophagitis: Secondary | ICD-10-CM

## 2019-07-28 MED FILL — DILTIAZEM HCL ER 300 MG CAP: 300 | 90 days supply | Qty: 90 | Fill #2

## 2019-07-28 MED FILL — PANTOPRAZOLE SOD DR 40 MG T: 40 | 90 days supply | Qty: 90 | Fill #0

## 2019-08-11 MED FILL — AMLODIPINE 2.5 MG TABLET: 2.5 | 90 days supply | Qty: 90 | Fill #3

## 2019-08-11 MED FILL — OLMESARTAN-HCTZ 40-25 MG TA: 40-25 | 90 days supply | Qty: 90 | Fill #3

## 2019-09-02 ENCOUNTER — Telehealth: Payer: Self-pay | Admitting: Gastroenterology

## 2019-09-02 NOTE — Telephone Encounter (Signed)
[facility] PT FOR COLONOSCOPY, Dx: PERSONAL HISTORY OF POLYPS PRIOR TO MAY 9.

## 2019-09-03 ENCOUNTER — Other Ambulatory Visit: Payer: Self-pay | Admitting: *Deleted

## 2019-09-03 ENCOUNTER — Other Ambulatory Visit: Payer: Self-pay

## 2019-09-03 MED ORDER — CLENPIQ 10-3.5-12 MG-GM -GM/160ML PO SOLN: 1.0000 | Freq: Once | ORAL | 0 refills | Status: DC

## 2019-09-03 MED FILL — CLENPIQ 10-3.5-12 MG-GM -GM: 10-3.5-12 M | 2 days supply | Qty: 320 | Fill #0

## 2019-09-03 NOTE — Telephone Encounter (Signed)
LMOVM for pt 

## 2019-09-03 NOTE — Telephone Encounter (Addendum)
Spoke with pt. She is scheduled for TCS with [facility] on 4/26 at 8:30am, arrival 7:30am. Request Rx for prep to be sent to [facility]. Rx has been sent. Patient aware will send prep instructions to her [facility]. COVID test appt also scheduled for Friday 4/23 at 3:10pm. Aware of location for COVID test appt.    PA submitted for TCS via [facility] website. Case#  [id number]

## 2019-09-04 NOTE — Telephone Encounter (Signed)
Per [employer] " CPT code ([phone], does not require prior auth "

## 2019-09-05 ENCOUNTER — Other Ambulatory Visit (HOSPITAL_COMMUNITY)
Admission: RE | Admit: 2019-09-05 | Discharge: 2019-09-05 | Disposition: A | Discharge status: Home / Self Care | Payer: 59 | Source: Ambulatory Visit | Attending: Gastroenterology | Admitting: Gastroenterology

## 2019-09-05 ENCOUNTER — Other Ambulatory Visit: Payer: Self-pay

## 2019-09-05 DIAGNOSIS — Z20822 Contact with and (suspected) exposure to covid-19: Secondary | ICD-10-CM | POA: Diagnosis not present

## 2019-09-05 DIAGNOSIS — Z01812 Encounter for preprocedural laboratory examination: Secondary | ICD-10-CM | POA: Insufficient documentation

## 2019-09-06 LAB — SARS CORONAVIRUS 2 (TAT 6-24 HRS): SARS Coronavirus 2: NEGATIVE

## 2019-09-08 ENCOUNTER — Encounter (HOSPITAL_COMMUNITY)
Admission: RE | Disposition: A | Discharge status: Home / Self Care | Payer: Self-pay | Source: Home / Self Care | Attending: Gastroenterology

## 2019-09-08 ENCOUNTER — Encounter (HOSPITAL_COMMUNITY): Payer: Self-pay | Admitting: Gastroenterology

## 2019-09-08 ENCOUNTER — Other Ambulatory Visit: Payer: Self-pay

## 2019-09-08 ENCOUNTER — Ambulatory Visit (HOSPITAL_COMMUNITY)
Admission: RE | Admit: 2019-09-08 | Discharge: 2019-09-08 | Disposition: A | Discharge status: Home / Self Care | Payer: 59 | Attending: Gastroenterology | Admitting: Gastroenterology

## 2019-09-08 DIAGNOSIS — D123 Benign neoplasm of transverse colon: Secondary | ICD-10-CM | POA: Diagnosis not present

## 2019-09-08 DIAGNOSIS — Z886 Allergy status to analgesic agent status: Secondary | ICD-10-CM | POA: Insufficient documentation

## 2019-09-08 DIAGNOSIS — Z803 Family history of malignant neoplasm of breast: Secondary | ICD-10-CM | POA: Diagnosis not present

## 2019-09-08 DIAGNOSIS — K5730 Diverticulosis of large intestine without perforation or abscess without bleeding: Secondary | ICD-10-CM | POA: Diagnosis not present

## 2019-09-08 DIAGNOSIS — Z7982 Long term (current) use of aspirin: Secondary | ICD-10-CM | POA: Insufficient documentation

## 2019-09-08 DIAGNOSIS — D473 Essential (hemorrhagic) thrombocythemia: Secondary | ICD-10-CM | POA: Diagnosis not present

## 2019-09-08 DIAGNOSIS — K635 Polyp of colon: Secondary | ICD-10-CM

## 2019-09-08 DIAGNOSIS — E785 Hyperlipidemia, unspecified: Secondary | ICD-10-CM | POA: Diagnosis not present

## 2019-09-08 DIAGNOSIS — Q438 Other specified congenital malformations of intestine: Secondary | ICD-10-CM | POA: Insufficient documentation

## 2019-09-08 DIAGNOSIS — Z820 Family history of epilepsy and other diseases of the nervous system: Secondary | ICD-10-CM | POA: Insufficient documentation

## 2019-09-08 DIAGNOSIS — Z801 Family history of malignant neoplasm of trachea, bronchus and lung: Secondary | ICD-10-CM | POA: Insufficient documentation

## 2019-09-08 DIAGNOSIS — K644 Residual hemorrhoidal skin tags: Secondary | ICD-10-CM | POA: Diagnosis not present

## 2019-09-08 DIAGNOSIS — D122 Benign neoplasm of ascending colon: Secondary | ICD-10-CM | POA: Insufficient documentation

## 2019-09-08 DIAGNOSIS — Z1211 Encounter for screening for malignant neoplasm of colon: Secondary | ICD-10-CM | POA: Insufficient documentation

## 2019-09-08 DIAGNOSIS — Z860100 Personal history of colon polyps, unspecified: Secondary | ICD-10-CM

## 2019-09-08 DIAGNOSIS — I10 Essential (primary) hypertension: Secondary | ICD-10-CM | POA: Insufficient documentation

## 2019-09-08 DIAGNOSIS — Z90710 Acquired absence of both cervix and uterus: Secondary | ICD-10-CM | POA: Insufficient documentation

## 2019-09-08 DIAGNOSIS — Z888 Allergy status to other drugs, medicaments and biological substances status: Secondary | ICD-10-CM | POA: Diagnosis not present

## 2019-09-08 DIAGNOSIS — Z79899 Other long term (current) drug therapy: Secondary | ICD-10-CM | POA: Diagnosis not present

## 2019-09-08 DIAGNOSIS — K648 Other hemorrhoids: Secondary | ICD-10-CM | POA: Diagnosis not present

## 2019-09-08 HISTORY — PX: POLYPECTOMY: SHX5525

## 2019-09-08 HISTORY — PX: COLONOSCOPY: SHX5424

## 2019-09-08 SURGERY — COLONOSCOPY
Anesthesia: Moderate Sedation

## 2019-09-08 MED ORDER — ONDANSETRON HCL 4 MG/2ML IJ SOLN
INTRAMUSCULAR | Status: AC
  Filled 2019-09-08: qty 2

## 2019-09-08 MED ORDER — ONDANSETRON HCL 4 MG/2ML IJ SOLN
INTRAMUSCULAR | Status: DC | PRN
  Administered 2019-09-08: 4 mg via INTRAVENOUS

## 2019-09-08 MED ORDER — MEPERIDINE HCL 100 MG/ML IJ SOLN
INTRAMUSCULAR | Status: AC
  Filled 2019-09-08: qty 2

## 2019-09-08 MED ORDER — STERILE WATER FOR IRRIGATION IR SOLN
Status: DC | PRN
  Administered 2019-09-08: 1.5 mL

## 2019-09-08 MED ORDER — SODIUM CHLORIDE 0.9 % IV SOLN: INTRAVENOUS | Status: DC

## 2019-09-08 MED ORDER — MIDAZOLAM HCL 5 MG/5ML IJ SOLN
INTRAMUSCULAR | Status: DC | PRN
  Administered 2019-09-08: 1 mg via INTRAVENOUS
  Administered 2019-09-08 (×2): 2 mg via INTRAVENOUS

## 2019-09-08 MED ORDER — MIDAZOLAM HCL 5 MG/5ML IJ SOLN
INTRAMUSCULAR | Status: AC
  Filled 2019-09-08: qty 10

## 2019-09-08 MED ORDER — MEPERIDINE HCL 100 MG/ML IJ SOLN
INTRAMUSCULAR | Status: DC | PRN
  Administered 2019-09-08: 25 mg via INTRAVENOUS
  Administered 2019-09-08: 50 mg via INTRAVENOUS

## 2019-09-08 NOTE — Op Note (Signed)
[facility] Patient Name: Kristin Hall Procedure Date: 09/08/2019 9:06 AM MRN: [id number] Date of Birth: 1956-05-04 Attending MD: Jonette Eva MD, MD CSN: [id number] Age: 64 Admit Type: Outpatient Procedure:                Colonoscopy WITH COLD SNARE/SNARE CAUTERY POLYECTOMY Indications:              Personal history of colonic polyps Providers:                Jonette Eva MD, MD, Buel Ream. Thomasena Edis RN, RN,                            Dyann Ruddle Referring MD:              Medicines:                Meperidine 100 mg IV, Midazolam 5 mg IV,                            Ondansetron 4 mg IV Complications:            No immediate complications. Estimated Blood Loss:     Estimated blood loss was minimal. Procedure:                Pre-Anesthesia Assessment:                           - Prior to the procedure, a History and Physical                            was performed, and patient medications and                            allergies were reviewed. The patient's tolerance of                            previous anesthesia was also reviewed. The risks                            and benefits of the procedure and the sedation                            options and risks were discussed with the patient.                            All questions were answered, and informed consent                            was obtained. Prior Anticoagulants: The patient has                            taken no previous anticoagulant or antiplatelet                            agents. ASA Grade Assessment: II - A patient with  mild systemic disease. After reviewing the risks                            and benefits, the patient was deemed in                            satisfactory condition to undergo the procedure.                            After obtaining informed consent, the colonoscope                            was passed under direct vision. Throughout the                             procedure, the patient's blood pressure, pulse, and                            oxygen saturations were monitored continuously. The                            [id number]-[id number] ([id number]) scope was introduced through                            the anus and advanced to the the cecum, identified                            by appendiceal orifice and ileocecal valve. The                            colonoscopy was somewhat difficult due to                            restricted mobility of the colon, a tortuous colon                            and the patient's agitation. Successful completion                            of the procedure was aided by increasing the dose                            of sedation medication, changing endoscopes,                            straightening and shortening the scope to obtain                            bowel loop reduction and COLOWRAP. The patient                            tolerated the procedure fairly well. The quality of  the bowel preparation was good. The ileocecal                            valve, appendiceal orifice, and rectum were                            photographed. Scope In: 10:14:00 AM Scope Out: 10:35:34 AM Scope Withdrawal Time: 0 hours 18 minutes 35 seconds  Total Procedure Duration: 0 hours 21 minutes 34 seconds  Findings:      Two sessile polyps were found in the mid transverse colon and ascending       colon. The polyps were 3 to 6 mm in size. These polyps were removed with       a cold snare. Resection and retrieval were complete.      Multiple small and large-mouthed diverticula were found in the       recto-sigmoid colon and sigmoid colon.      External and internal hemorrhoids were found.      The recto-sigmoid colon and sigmoid colon were mildly tortuous. Impression:               - Two 3 to 6 mm polyps in the mid transverse colon                            and in the ascending colon, removed with a cold                             snare. Resected and retrieved.                           - Diverticulosis in the recto-sigmoid colon and in                            the sigmoid colon.                           - External and internal hemorrhoids.                           - Tortuous colon. Moderate Sedation:      Moderate (conscious) sedation was administered by the endoscopy nurse       and supervised by the endoscopist. The following parameters were       monitored: oxygen saturation, heart rate, blood pressure, and response       to care. Total physician intraservice time was 69 minutes. Recommendation:           - Patient has a contact number available for                            emergencies. The signs and symptoms of potential                            delayed complications were discussed with the                            patient. Return to normal activities tomorrow.  Written discharge instructions were provided to the                            patient.                           - High fiber diet.                           - Continue present medications.                           - Await pathology results.                           - Repeat colonoscopy in 5 years for surveillance. Procedure Code(s):        --- Professional ---                           [phone], Colonoscopy, flexible; with removal of                            tumor(s), polyp(s), or other lesion(s) by snare                            technique                           99153, Moderate sedation; each additional 15                            minutes intraservice time                           99153, Moderate sedation; each additional 15                            minutes intraservice time                           99153, Moderate sedation; each additional 15                            minutes intraservice time                           99153, Moderate sedation; each additional 15                             minutes intraservice time                           G0500, Moderate sedation services provided by the                            same physician or other qualified health care  professional performing a gastrointestinal                            endoscopic service that sedation supports,                            requiring the presence of an independent trained                            observer to assist in the monitoring of the                            patient's level of consciousness and physiological                            status; initial 15 minutes of intra-service time;                            patient age 31 years or older (additional time may                            be reported with 45409, as appropriate) Diagnosis Code(s):        --- Professional ---                           K63.5, Polyp of colon                           K64.8, Other hemorrhoids                           Z86.010, Personal history of colonic polyps                           K57.30, Diverticulosis of large intestine without                            perforation or abscess without bleeding                           Q43.8, Other specified congenital malformations of                            intestine CPT copyright 2019 American [employer] Association. All rights reserved. The codes documented in this report are preliminary and upon coder review may  be revised to meet current compliance requirements. Jonette Eva, MD Jonette Eva MD, MD 09/08/2019 11:53:46 AM This report has been signed electronically. Number of Addenda: 0

## 2019-09-08 NOTE — Discharge Instructions (Signed)
You have internal AND EXTERNAL hemorrhoids and diverticulosis IN YOUR LEFT COLON. YOU HAD TWO SMALL POLYPS REMOVED.    DRINK WATER TO KEEP YOUR URINE LIGHT YELLOW.  FOLLOW A HIGH FIBER DIET. AVOID ITEMS THAT CAUSE BLOATING. See info below.   USE PREPARATION H FOUR TIMES  A DAY IF NEEDED TO RELIEVE RECTAL PAIN/PRESSURE/BLEEDING.   YOUR BIOPSY RESULTS WILL BE BACK IN 5 BUSINESS DAYS.  NEXT COLONOSCOPY IN 5 YEARS.   Colonoscopy Care After Read the instructions outlined below and refer to this sheet in the next week. These discharge instructions provide you with general information on caring for yourself after you leave the hospital. While your treatment has been planned according to the most current medical practices available, unavoidable complications occasionally occur. If you have any problems or questions after discharge, call DR. Tyqwan Pink, [phone].  ACTIVITY  You may resume your regular activity, but move at a slower pace for the next 24 hours.   Take frequent rest periods for the next 24 hours.   Walking will help get rid of the air and reduce the bloated feeling in your belly (abdomen).   No driving for 24 hours (because of the medicine (anesthesia) used during the test).   You may shower.   Do not sign any important legal documents or operate any machinery for 24 hours (because of the anesthesia used during the test).    NUTRITION  Drink plenty of fluids.   You may resume your normal diet as instructed by your doctor.   Begin with a light meal and progress to your normal diet. Heavy or fried foods are harder to digest and may make you feel sick to your stomach (nauseated).   Avoid alcoholic beverages for 24 hours or as instructed.    MEDICATIONS  You may resume your normal medications.   WHAT YOU CAN EXPECT TODAY  Some feelings of bloating in the abdomen.   Passage of more gas than usual.   Spotting of blood in your stool or on the toilet paper  .    IF YOU HAD POLYPS REMOVED DURING THE COLONOSCOPY:  Eat a soft diet IF YOU HAVE NAUSEA, BLOATING, ABDOMINAL PAIN, OR VOMITING.    FINDING OUT THE RESULTS OF YOUR TEST Not all test results are available during your visit. DR. Oneida Alar WILL CALL YOU WITHIN 14 DAYS OF YOUR PROCEDUE WITH YOUR RESULTS. Do not assume everything is normal if you have not heard from DR. Tareq Dwan, CALL HER OFFICE AT [phone].  SEEK IMMEDIATE MEDICAL ATTENTION AND CALL THE OFFICE: [phone] IF:  You have more than a spotting of blood in your stool.   Your belly is swollen (abdominal distention).   You are nauseated or vomiting.   You have a temperature over 101F.   You have abdominal pain or discomfort that is severe or gets worse throughout the day.  High-Fiber Diet A high-fiber diet changes your normal diet to include more whole grains, legumes, fruits, and vegetables. Changes in the diet involve replacing refined carbohydrates with unrefined foods. The calorie level of the diet is essentially unchanged. The Dietary Reference Intake (recommended amount) for adult males is 38 grams per day. For adult females, it is 25 grams per day. Pregnant and lactating women should consume 28 grams of fiber per day. Fiber is the intact part of a plant that is not broken down during digestion. Functional fiber is fiber that has been isolated from the plant to provide a beneficial effect in the body.  PURPOSE  Increase stool bulk.   Ease and regulate bowel movements.   Lower cholesterol.   REDUCE RISK OF COLON CANCER  INDICATIONS THAT YOU NEED MORE FIBER  Constipation and hemorrhoids.   Uncomplicated diverticulosis (intestine condition) and irritable bowel syndrome.   Weight management.   As a protective measure against hardening of the arteries (atherosclerosis), diabetes, and cancer.   GUIDELINES FOR INCREASING FIBER IN THE DIET  Start adding fiber to the diet slowly. A gradual increase of about 5 more  grams (2 servings of most fruits or vegetables) per day is best. Too rapid an increase in fiber may result in constipation, flatulence, and bloating.   Drink enough water and fluids to keep your urine clear or pale yellow. Water, juice, or caffeine-free drinks are recommended. Not drinking enough fluid may cause constipation.   Eat a variety of high-fiber foods rather than one type of fiber.   Try to increase your intake of fiber through using high-fiber foods rather than fiber pills or supplements that contain small amounts of fiber.   The goal is to change the types of food eaten. Do not supplement your present diet with high-fiber foods, but replace foods in your present diet.    Polyps, Colon  A polyp is extra tissue that grows inside your body. Colon polyps grow in the large intestine. The large intestine, also called the colon, is part of your digestive system. It is a long, hollow tube at the end of your digestive tract where your body makes and stores stool. Most polyps are not dangerous. They are benign. This means they are not cancerous. But over time, some types of polyps can turn into cancer. Polyps that are smaller than a pea are usually not harmful. But larger polyps could someday become or may already be cancerous. To be safe, doctors remove all polyps and test them.   PREVENTION There is not one sure way to prevent polyps. You might be able to lower your risk of getting them if you:  Eat more fruits and vegetables and less fatty food.   Do not smoke.   Avoid alcohol.   Exercise every day.   Lose weight if you are overweight.   Eating more calcium and folate can also lower your risk of getting polyps. Some foods that are rich in calcium are milk, cheese, and broccoli. Some foods that are rich in folate are chickpeas, kidney beans, and spinach.    Diverticulosis Diverticulosis is a common condition that develops when small pouches (diverticula) form in the wall of the  colon. The risk of diverticulosis increases with age. It happens more often in people who eat a low-fiber diet. Most individuals with diverticulosis have no symptoms. Those individuals with symptoms usually experience belly (abdominal) pain, constipation, or loose stools (diarrhea).  HOME CARE INSTRUCTIONS  Increase the amount of fiber in your diet as directed by your caregiver or dietician. This may reduce symptoms of diverticulosis.   Drink at least 6 to 8 glasses of water each day to prevent constipation.   Try not to strain when you have a bowel movement.   Avoiding nuts and seeds to prevent complications is NOT NECESSARY.   FOODS HAVING HIGH FIBER CONTENT INCLUDE:  Fruits. Apple, peach, pear, tangerine, raisins, prunes.   Vegetables. Brussels sprouts, asparagus, broccoli, cabbage, carrot, cauliflower, romaine lettuce, spinach, summer squash, tomato, winter squash, zucchini.   Starchy Vegetables. Baked beans, kidney beans, lima beans, split peas, lentils, potatoes (with skin).  SEEK IMMEDIATE MEDICAL CARE IF:  You develop increasing pain or severe bloating.   You have an oral temperature above 101F.   You develop vomiting or bowel movements that are bloody or black.

## 2019-09-08 NOTE — H&P (Signed)
Primary Care Physician:  Fayrene Helper, MD Primary Gastroenterologist:  Dr. Oneida Alar  Pre-Procedure History & Physical: HPI:  Kristin Hall is a 64 y.o. female here for  PERSONAL HISTORY OF POLYPS: simple adenoma JUL 2018.Marland Kitchen  Past Medical History:  Diagnosis Date  . Elevated alkaline phosphatase level   . Headache   . Hyperlipidemia   . Hypertension   . Thrombocytosis ([place]) 07/23/2015    Past Surgical History:  Procedure Laterality Date  . ABDOMINAL HYSTERECTOMY     partial   . COLONOSCOPY  10/04/2006   SLF: Normal retroflexed view of the rectum/Normal colon without evidence of polyps, masses, inflammatory changes, diverticula or arteriovenous malformations  . COLONOSCOPY N/A 12/07/2016   Dr. Oneida Alar: 15 mm polyp in cecum, s/p APC and clips. One 30 mm polyp in sigmoid, s/p clip, injection, tattooed. Path with one adenoma and one hamartomatous polyp. Surveillance 2021.   Marland Kitchen POLYPECTOMY  12/07/2016   Procedure: POLYPECTOMY;  Surgeon: Danie Binder, MD;  Location: [facility];  Service: Endoscopy;;  cecal;    Prior to Admission medications   Medication Sig Start Date End Date Taking? Authorizing Provider  acetaminophen (TYLENOL) 500 MG tablet Take 1 tablet (500 mg total) by mouth every 8 (eight) hours as needed. 03/02/16  Yes Fayrene Helper, MD  amLODipine (NORVASC) 2.5 MG tablet Take 1 tablet (2.5 mg total) by mouth daily. 11/11/18  Yes Fayrene Helper, MD  diltiazem (TIAZAC) 300 MG 24 hr capsule Take 1 capsule (300 mg total) by mouth daily. 11/11/18  Yes Fayrene Helper, MD  ezetimibe-simvastatin (VYTORIN) 10-10 MG tablet TAKE 1 TABLET BY MOUTH AT BEDTIME FOR CHOLESTEROL. Patient taking differently: Take 1 tablet by mouth 3 (three) times a week. TAKE 1 TABLET BY MOUTH AT BEDTIME FOR CHOLESTEROL. 3 TIMES WEEKLY 04/21/19  Yes Fayrene Helper, MD  Multiple Vitamins-Minerals (ALIVE WOMENS 50+) TABS Take 1 tablet by mouth daily.   Yes [provider]   olmesartan-hydrochlorothiazide (BENICAR HCT) 40-25 MG tablet Take 1 tablet by mouth daily. 11/11/18  Yes Fayrene Helper, MD  pantoprazole (PROTONIX) 40 MG tablet TAKE 1 TABLET BY MOUTH ONCE A DAY 07/28/19  Yes Perlie Mayo, NP  potassium chloride SA (KLOR-CON) 20 MEQ tablet Take 2 tablets (40 mEq total) by mouth daily. Patient taking differently: Take 20 mEq by mouth 2 (two) times daily.  04/21/19  Yes Fayrene Helper, MD  UNABLE TO FIND Blood pressure monitor x 1  DX I10 11/11/18   Fayrene Helper, MD    Allergies as of 09/03/2019 - Review Complete 04/21/2019  Allergen Reaction Noted  . Aspirin Other (See Comments) 09/22/2013  . Metronidazole Hives and Itching 11/12/2007  . Valsartan Cough 04/19/2015    Family History  Problem Relation Age of Onset  . Cancer Mother        Breast  . Dementia Mother   . Cancer Father        Lung Cancer, deceased at 20  . Colon cancer Neg Hx   . Liver disease Neg Hx     Social History   Socioeconomic History  . Marital status: Widowed    Spouse name: Not on file  . Number of children: Not on file  . Years of education: Not on file  . Highest education level: Not on file  Occupational History  . Not on file  Tobacco Use  . Smoking status: Never Smoker  . Smokeless tobacco: Never Used  Substance and Sexual  Activity  . Alcohol use: No  . Drug use: No  . Sexual activity: Yes  Other Topics Concern  . Not on file  Social History Narrative  . Not on file   Social Determinants of Health   Financial Resource Strain:   . Difficulty of Paying Living Expenses:   Food Insecurity:   . Worried About Charity fundraiser in the Last Year:   . Arboriculturist in the Last Year:   Transportation Needs:   . Film/video editor (Medical):   Marland Kitchen Lack of Transportation (Non-Medical):   Physical Activity:   . Days of Exercise per Week:   . Minutes of Exercise per Session:   Stress:   . Feeling of Stress :   Social Connections:   .  Frequency of Communication with Friends and Family:   . Frequency of Social Gatherings with Friends and Family:   . Attends Religious Services:   . Active Member of Clubs or Organizations:   . Attends Archivist Meetings:   Marland Kitchen Marital Status:   Intimate Partner Violence:   . Fear of Current or Ex-Partner:   . Emotionally Abused:   Marland Kitchen Physically Abused:   . Sexually Abused:     Review of Systems: See HPI, otherwise negative ROS   Physical Exam: BP 133/83   Pulse (!) 104   Temp 98.8 F (37.1 C) (Oral)   Resp 17   Ht 5\' 3"  (1.6 m)   SpO2 97%   BMI 31.89 kg/m  General:   Alert,  pleasant and cooperative in NAD Head:  Normocephalic and atraumatic. Neck:  Supple; Lungs:  Clear throughout to auscultation.    Heart:  Regular rate and rhythm. Abdomen:  Soft, nontender and nondistended. Normal bowel sounds, without guarding, and without rebound.   Neurologic:  Alert and  oriented x4;  grossly normal neurologically.  Impression/Plan:      PERSONAL HISTORY OF POLYPS.  PLAN: 1. TCS TODAY. DISCUSSED PROCEDURE, BENEFITS, & RISKS: < 1% chance of medication reaction, bleeding, perforation, ASPIRATION, or rupture of spleen/liver requiring surgery to fix it and missed polyps < 1 cm 10-20% of the time.

## 2019-09-09 LAB — SURGICAL PATHOLOGY

## 2019-09-09 MED FILL — POTASSIUM CHLORIDE CRYS ER: 20 | 90 days supply | Qty: 180 | Fill #1

## 2019-09-29 MED FILL — EZETIMIBE-SIMVASTATIN 10-10: 10-10 | 30 days supply | Qty: 30 | Fill #1

## 2019-10-08 ENCOUNTER — Other Ambulatory Visit: Payer: Self-pay | Admitting: Emergency Medicine

## 2019-10-08 ENCOUNTER — Telehealth: Payer: Self-pay | Admitting: Gastroenterology

## 2019-10-08 ENCOUNTER — Encounter: Payer: Self-pay | Admitting: Gastroenterology

## 2019-10-08 NOTE — Telephone Encounter (Signed)
I don't see where it says patient is due for [place]. AB please advise thanks

## 2019-10-08 NOTE — Telephone Encounter (Signed)
Recall for ultrasound 

## 2019-10-08 NOTE — Telephone Encounter (Signed)
I am not sure either. She does have a history of chronically elevated alk phos. Recommend repeat HFP now and may need further evaluation if climbing.

## 2019-10-08 NOTE — Telephone Encounter (Signed)
Called lmom

## 2019-10-09 ENCOUNTER — Other Ambulatory Visit: Payer: Self-pay | Admitting: Emergency Medicine

## 2019-10-09 DIAGNOSIS — R748 Abnormal levels of other serum enzymes: Secondary | ICD-10-CM

## 2019-10-09 NOTE — Progress Notes (Signed)
.  hf

## 2019-10-09 NOTE — Telephone Encounter (Signed)
Called pt verified name and dob Notified pt of recommendations  Hfp for evaluated alk phos [facility]  Pt stated she will have it done today

## 2019-10-10 ENCOUNTER — Other Ambulatory Visit (HOSPITAL_COMMUNITY)
Admission: RE | Admit: 2019-10-10 | Discharge: 2019-10-10 | Disposition: A | Discharge status: Home / Self Care | Payer: 59 | Source: Ambulatory Visit | Attending: Gastroenterology | Admitting: Gastroenterology

## 2019-10-10 DIAGNOSIS — R748 Abnormal levels of other serum enzymes: Secondary | ICD-10-CM | POA: Insufficient documentation

## 2019-10-10 LAB — HEPATIC FUNCTION PANEL
ALT: 25 U/L (ref 0–44)
AST: 31 U/L (ref 15–41)
Albumin: 4.1 g/dL (ref 3.5–5.0)
Alkaline Phosphatase: 142 U/L — ABNORMAL HIGH (ref 38–126)
Bilirubin, Direct: 0.1 mg/dL (ref 0.0–0.2)
Indirect Bilirubin: 0.8 mg/dL (ref 0.3–0.9)
Total Bilirubin: 0.9 mg/dL (ref 0.3–1.2)
Total Protein: 7.9 g/dL (ref 6.5–8.1)

## 2019-10-16 ENCOUNTER — Telehealth: Payer: Self-pay | Admitting: Gastroenterology

## 2019-10-16 ENCOUNTER — Other Ambulatory Visit: Payer: Self-pay | Admitting: Gastroenterology

## 2019-10-16 ENCOUNTER — Other Ambulatory Visit: Payer: Self-pay | Admitting: *Deleted

## 2019-10-16 DIAGNOSIS — R748 Abnormal levels of other serum enzymes: Secondary | ICD-10-CM

## 2019-10-16 NOTE — Telephone Encounter (Signed)
Called pt gave her results. See lab notes

## 2019-10-16 NOTE — Telephone Encounter (Signed)
Pt was returning call from yesterday. She said to call her at work [phone] or [facility] at her house number [phone]

## 2019-10-27 ENCOUNTER — Other Ambulatory Visit (HOSPITAL_COMMUNITY)
Admission: RE | Admit: 2019-10-27 | Discharge: 2019-10-27 | Disposition: A | Discharge status: Home / Self Care | Payer: 59 | Source: Ambulatory Visit | Attending: Gastroenterology | Admitting: Gastroenterology

## 2019-10-27 DIAGNOSIS — R748 Abnormal levels of other serum enzymes: Secondary | ICD-10-CM | POA: Diagnosis not present

## 2019-10-27 LAB — GAMMA GT: GGT: 22 U/L (ref 7–50)

## 2019-10-28 LAB — MITOCHONDRIAL ANTIBODIES: Mitochondrial M2 Ab, IgG: <20 U (ref 0.0–20.0)

## 2019-10-28 LAB — IGM: IgM (Immunoglobulin M), Srm: 68 mg/dL (ref 26–217)

## 2019-10-28 LAB — IGG: IgG (Immunoglobin G), Serum: 1297 mg/dL (ref 586–1602)

## 2019-10-28 LAB — IGA: IgA: 214 mg/dL (ref 87–352)

## 2019-10-31 ENCOUNTER — Other Ambulatory Visit: Payer: Self-pay

## 2019-10-31 DIAGNOSIS — K219 Gastro-esophageal reflux disease without esophagitis: Secondary | ICD-10-CM

## 2019-10-31 MED ORDER — PANTOPRAZOLE SODIUM 40 MG PO TBEC: 40.0000 mg | DELAYED_RELEASE_TABLET | Freq: Every day | ORAL | 0 refills | Status: DC

## 2019-10-31 MED FILL — DILTIAZEM HCL ER 300 MG CAP: 300 | 90 days supply | Qty: 90 | Fill #3

## 2019-10-31 MED FILL — PANTOPRAZOLE SOD DR 40 MG T: 40 | 90 days supply | Qty: 90 | Fill #0

## 2019-11-05 ENCOUNTER — Telehealth: Payer: Self-pay | Admitting: Internal Medicine

## 2019-11-05 NOTE — Telephone Encounter (Signed)
Pt returning call. She said to call her after 11 am. [phone]

## 2019-11-06 ENCOUNTER — Encounter: Payer: Self-pay | Admitting: Emergency Medicine

## 2019-11-07 NOTE — Telephone Encounter (Signed)
3 attempts were made to contact pt about lab results, letter was mailed. See previous note

## 2019-11-17 ENCOUNTER — Other Ambulatory Visit: Payer: Self-pay | Admitting: Family Medicine

## 2019-11-18 ENCOUNTER — Other Ambulatory Visit: Payer: Self-pay | Admitting: Family Medicine

## 2019-11-18 ENCOUNTER — Encounter: Payer: 59 | Admitting: Family Medicine

## 2019-11-18 MED FILL — AMLODIPINE 2.5 MG TABLET: 2.5 | 90 days supply | Qty: 90 | Fill #0

## 2019-11-18 MED FILL — OLMESARTAN-HCTZ 40-25 MG TA: 40-25 | 90 days supply | Qty: 90 | Fill #0

## 2019-11-21 DIAGNOSIS — K219 Gastro-esophageal reflux disease without esophagitis: Secondary | ICD-10-CM | POA: Diagnosis not present

## 2019-12-04 ENCOUNTER — Ambulatory Visit (INDEPENDENT_AMBULATORY_CARE_PROVIDER_SITE_OTHER): Payer: 59 | Admitting: Family Medicine

## 2019-12-04 ENCOUNTER — Other Ambulatory Visit: Payer: Self-pay

## 2019-12-04 ENCOUNTER — Encounter: Payer: Self-pay | Admitting: Family Medicine

## 2019-12-04 VITALS — BP 133/66 | HR 104 | Resp 16 | Ht 63.0 in | Wt 185.0 lb

## 2019-12-04 DIAGNOSIS — Z0000 Encounter for general adult medical examination without abnormal findings: Secondary | ICD-10-CM | POA: Diagnosis not present

## 2019-12-04 DIAGNOSIS — E785 Hyperlipidemia, unspecified: Secondary | ICD-10-CM | POA: Diagnosis not present

## 2019-12-04 DIAGNOSIS — E559 Vitamin D deficiency, unspecified: Secondary | ICD-10-CM

## 2019-12-04 DIAGNOSIS — I10 Essential (primary) hypertension: Secondary | ICD-10-CM

## 2019-12-04 NOTE — Patient Instructions (Signed)
Follow-up in office with MD in 6 months call if you need me sooner.  Please schedule your mammogram m.  Fasting lipid CMP and EGFR TSH CBC and vitamin D in the next 1 to 2 weeks.  No changes in medication at this time.  It is important that you exercise regularly at least 30 minutes 5 times a week. If you develop chest pain, have severe difficulty breathing, or feel very tired, stop exercising immediately and seek medical attention  Think about what you will eat, plan ahead. Choose " clean, green, fresh or frozen" over canned, processed or packaged foods which are more sugary, salty and fatty. 70 to 75% of food eaten should be vegetables and fruit. Three meals at set times with snacks allowed between meals, but they must be fruit or vegetables. Aim to eat over a 12 hour period , example 7 am to 7 pm, and STOP after  your last meal of the day. Drink water,generally about 64 ounces per day, no other drink is as healthy. Fruit juice is best enjoyed in a healthy way, by EATING the fruit. Thanks for choosing [facility], we consider it a privelige to serve you.  

## 2019-12-04 NOTE — Assessment & Plan Note (Signed)

## 2019-12-05 ENCOUNTER — Encounter: Payer: Self-pay | Admitting: Family Medicine

## 2019-12-05 NOTE — Progress Notes (Signed)
Kristin Hall     MRN: [id number]      DOB: September 06, 1955  HPI: Patient is in for annual physical exam. No other health concerns are expressed or addressed at the visit.  Immunization is reviewed , and  Is up to date   PE: BP (!) 133/66   Pulse 104   Resp 16   Ht 5\' 3"  (1.6 m)   Wt 185 lb (83.9 kg)   SpO2 95%   BMI 32.77 kg/m   Pleasant  female, alert and oriented x 3, in no cardio-pulmonary distress. Afebrile. HEENT No facial trauma or asymetry. Sinuses non tender.  Extra occullar muscles intact.. External ears normal, . Neck: supple, no adenopathy,JVD or thyromegaly.No bruits.  Chest: Clear to ascultation bilaterally.No crackles or wheezes. Non tender to palpation  Breast: No asymetry,no masses or lumps. No tenderness. No nipple discharge or inversion. No axillary or supraclavicular adenopathy  Cardiovascular system; Heart sounds normal,  S1 and  S2 ,no S3.  No murmur, or thrill. Apical beat not displaced Peripheral pulses normal.  Abdomen: Soft, non tender, no organomegaly or masses. No bruits. Bowel sounds normal. No guarding, tenderness or rebound.   GU: Asymptomatic, not examined  Musculoskeletal exam: Full ROM of spine, hips , shoulders and knees. No deformity ,swelling or crepitus noted. No muscle wasting or atrophy.   Neurologic: Cranial nerves 2 to 12 intact. Power, tone ,sensation and reflexes normal throughout. No disturbance in gait. No tremor.  Skin: Intact, no ulceration, erythema , scaling or rash noted. Pigmentation normal throughout  Psych; Normal mood and affect. Judgement and concentration normal   Assessment & Plan:  Annual physical exam Annual exam as documented. Counseling done  re healthy lifestyle involving commitment to 150 minutes exercise per week, heart healthy diet, and attaining healthy weight.The importance of adequate sleep also discussed. Regular seat belt use and home safety, is also discussed. Changes in  health habits are decided on by the patient with goals and time frames  set for achieving them. Immunization and cancer screening needs are specifically addressed at this visit.

## 2019-12-08 ENCOUNTER — Other Ambulatory Visit (HOSPITAL_COMMUNITY): Payer: Self-pay | Admitting: Family Medicine

## 2019-12-08 DIAGNOSIS — Z1231 Encounter for screening mammogram for malignant neoplasm of breast: Secondary | ICD-10-CM

## 2019-12-10 ENCOUNTER — Other Ambulatory Visit: Payer: Self-pay

## 2019-12-10 MED ORDER — POTASSIUM CHLORIDE CRYS ER 20 MEQ PO TBCR: 20.0000 meq | EXTENDED_RELEASE_TABLET | Freq: Two times a day (BID) | ORAL | 1 refills | Status: DC

## 2019-12-10 MED FILL — POTASSIUM CHLORIDE CRYS ER: 20 | 90 days supply | Qty: 180 | Fill #0

## 2019-12-22 MED FILL — EZETIMIBE-SIMVASTATIN 10-10: 10-10 | 30 days supply | Qty: 30 | Fill #2

## 2020-01-02 ENCOUNTER — Ambulatory Visit (HOSPITAL_COMMUNITY)
Admission: RE | Admit: 2020-01-02 | Discharge: 2020-01-02 | Disposition: A | Discharge status: Home / Self Care | Payer: 59 | Source: Ambulatory Visit | Attending: Family Medicine | Admitting: Family Medicine

## 2020-01-02 ENCOUNTER — Other Ambulatory Visit: Payer: Self-pay

## 2020-01-02 DIAGNOSIS — Z1231 Encounter for screening mammogram for malignant neoplasm of breast: Secondary | ICD-10-CM | POA: Diagnosis not present

## 2020-01-05 ENCOUNTER — Other Ambulatory Visit (HOSPITAL_COMMUNITY): Payer: Self-pay | Admitting: Family Medicine

## 2020-01-05 DIAGNOSIS — R928 Other abnormal and inconclusive findings on diagnostic imaging of breast: Secondary | ICD-10-CM

## 2020-01-06 ENCOUNTER — Ambulatory Visit (HOSPITAL_COMMUNITY)
Admission: RE | Admit: 2020-01-06 | Discharge: 2020-01-06 | Disposition: A | Discharge status: Home / Self Care | Payer: 59 | Source: Ambulatory Visit | Attending: Family Medicine | Admitting: Family Medicine

## 2020-01-06 ENCOUNTER — Other Ambulatory Visit: Payer: Self-pay

## 2020-01-06 ENCOUNTER — Encounter (HOSPITAL_COMMUNITY): Payer: Self-pay

## 2020-01-06 DIAGNOSIS — R928 Other abnormal and inconclusive findings on diagnostic imaging of breast: Secondary | ICD-10-CM

## 2020-01-06 DIAGNOSIS — N6012 Diffuse cystic mastopathy of left breast: Secondary | ICD-10-CM | POA: Diagnosis not present

## 2020-01-06 HISTORY — DX: Obstructive sleep apnea (adult) (pediatric): G47.33

## 2020-01-06 HISTORY — DX: Obstructive sleep apnea (adult) (pediatric): Z99.89

## 2020-01-26 ENCOUNTER — Other Ambulatory Visit: Payer: Self-pay | Admitting: Family Medicine

## 2020-01-27 ENCOUNTER — Other Ambulatory Visit: Payer: Self-pay | Admitting: Family Medicine

## 2020-01-27 MED FILL — DILTIAZEM HCL ER 300 MG CAP: 300 | 90 days supply | Qty: 90 | Fill #0

## 2020-02-09 ENCOUNTER — Telehealth: Payer: Self-pay | Admitting: Family Medicine

## 2020-02-09 ENCOUNTER — Other Ambulatory Visit: Payer: Self-pay

## 2020-02-09 ENCOUNTER — Other Ambulatory Visit: Payer: Self-pay | Admitting: Family Medicine

## 2020-02-09 DIAGNOSIS — K219 Gastro-esophageal reflux disease without esophagitis: Secondary | ICD-10-CM

## 2020-02-09 MED ORDER — PANTOPRAZOLE SODIUM 40 MG PO TBEC: 40.0000 mg | DELAYED_RELEASE_TABLET | Freq: Every day | ORAL | 0 refills | Status: DC

## 2020-02-09 MED FILL — PANTOPRAZOLE SOD DR 40 MG T: 40 | 90 days supply | Qty: 90 | Fill #0

## 2020-02-09 NOTE — Telephone Encounter (Signed)
Patient needing a refill on protonix sent to [facility] phone is [phone]

## 2020-02-16 DIAGNOSIS — G4733 Obstructive sleep apnea (adult) (pediatric): Secondary | ICD-10-CM | POA: Diagnosis not present

## 2020-02-16 DIAGNOSIS — R5383 Other fatigue: Secondary | ICD-10-CM | POA: Diagnosis not present

## 2020-02-16 DIAGNOSIS — I10 Essential (primary) hypertension: Secondary | ICD-10-CM | POA: Diagnosis not present

## 2020-02-16 DIAGNOSIS — E669 Obesity, unspecified: Secondary | ICD-10-CM | POA: Diagnosis not present

## 2020-02-16 MED FILL — OLMESARTAN-HCTZ 40-25 MG TA: 40-25 | 90 days supply | Qty: 90 | Fill #1

## 2020-02-16 MED FILL — AMLODIPINE 2.5 MG TABLET: 2.5 | 90 days supply | Qty: 90 | Fill #1

## 2020-02-27 DIAGNOSIS — G4733 Obstructive sleep apnea (adult) (pediatric): Secondary | ICD-10-CM | POA: Diagnosis not present

## 2020-03-08 MED FILL — EZETIMIBE-SIMVASTATIN 10-10: 10-10 | 30 days supply | Qty: 30 | Fill #3

## 2020-03-08 MED FILL — POTASSIUM CHLORIDE CRYS ER: 20 | 90 days supply | Qty: 180 | Fill #1

## 2020-04-01 DIAGNOSIS — H40013 Open angle with borderline findings, low risk, bilateral: Secondary | ICD-10-CM | POA: Diagnosis not present

## 2020-04-06 DIAGNOSIS — H52223 Regular astigmatism, bilateral: Secondary | ICD-10-CM | POA: Diagnosis not present

## 2020-04-06 DIAGNOSIS — H5203 Hypermetropia, bilateral: Secondary | ICD-10-CM | POA: Diagnosis not present

## 2020-04-26 MED FILL — DILTIAZEM HCL ER 300 MG CAP: 300 | 90 days supply | Qty: 90 | Fill #1

## 2020-05-03 ENCOUNTER — Other Ambulatory Visit: Payer: Self-pay | Admitting: Family Medicine

## 2020-05-03 DIAGNOSIS — K219 Gastro-esophageal reflux disease without esophagitis: Secondary | ICD-10-CM

## 2020-05-03 MED FILL — PANTOPRAZOLE SOD DR 40 MG T: 40 | 90 days supply | Qty: 90 | Fill #0

## 2020-05-17 ENCOUNTER — Other Ambulatory Visit: Payer: Self-pay | Admitting: Family Medicine

## 2020-05-17 MED FILL — EZETIMIBE-SIMVASTATIN 10-10: 10-10 | 30 days supply | Qty: 30 | Fill #0

## 2020-05-17 MED FILL — AMLODIPINE 2.5 MG TABLET: 2.5 | 90 days supply | Qty: 90 | Fill #2

## 2020-05-17 MED FILL — OLMESARTAN-HCTZ 40-25 MG TA: 40-25 | 90 days supply | Qty: 90 | Fill #2

## 2020-06-03 IMAGING — MG DIGITAL SCREENING BILATERAL MAMMOGRAM WITH TOMO AND CAD
6 of 10 series · 6 of 30 positions shown · non-contrast
Comparison: Previous exam(s).

CLINICAL DATA: Screening.

EXAM:
DIGITAL SCREENING BILATERAL MAMMOGRAM WITH TOMO AND CAD

[R CC synth-2D]
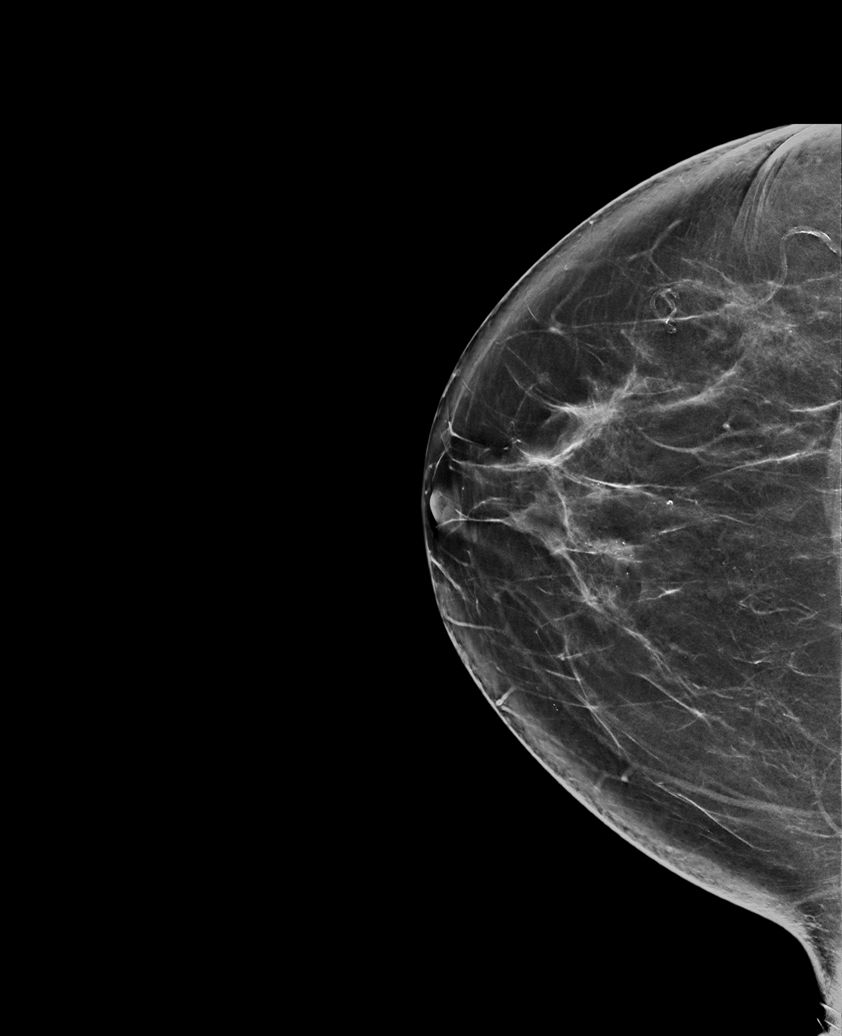

[L MLO synth-2D]
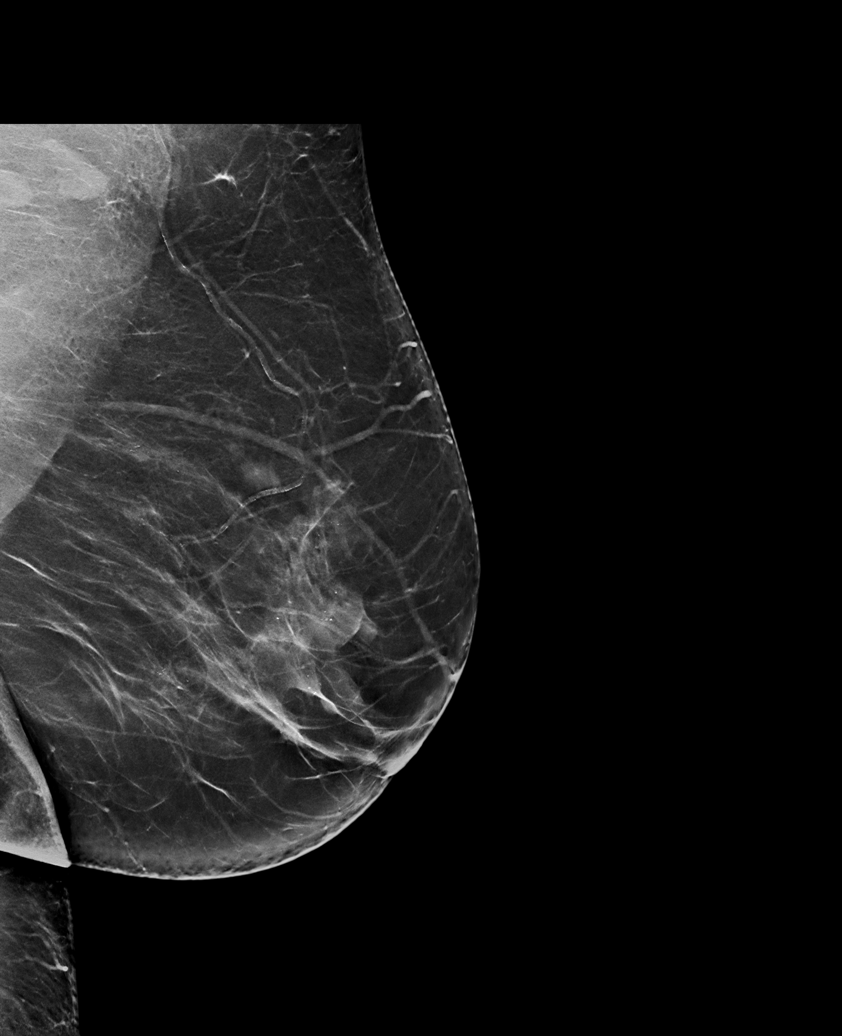

[L CC synth-2D]
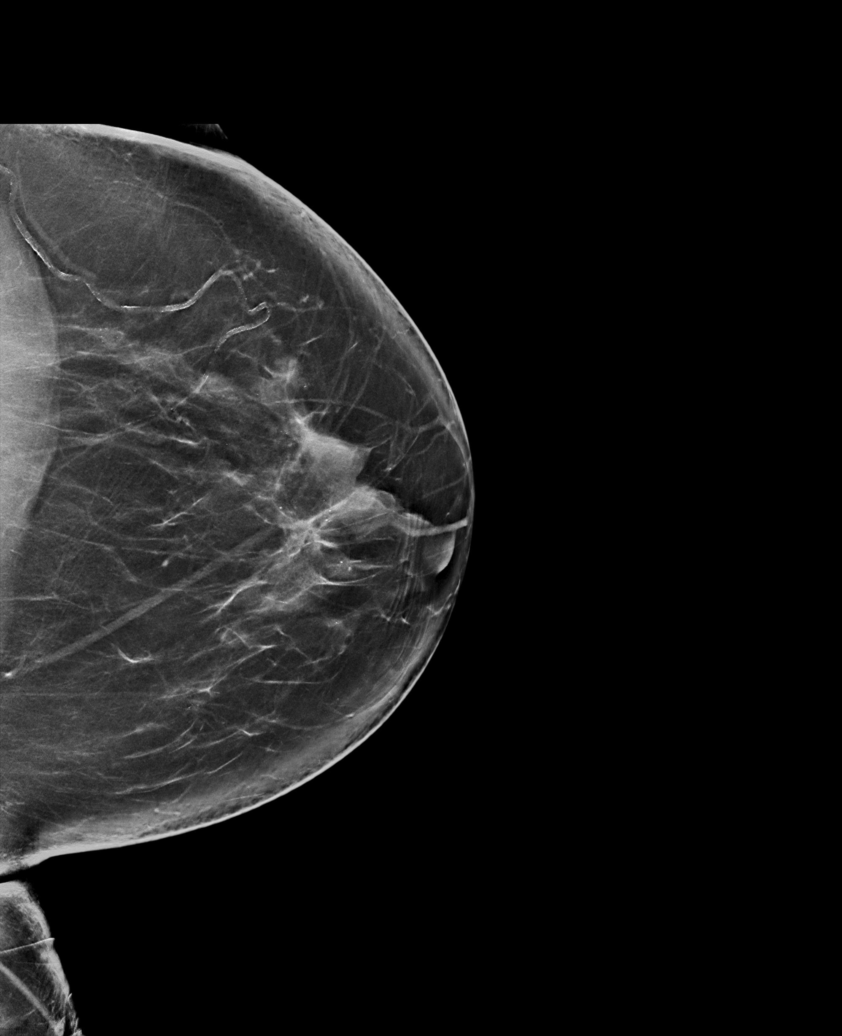

[R MLO synth-2D (1 of 2)]
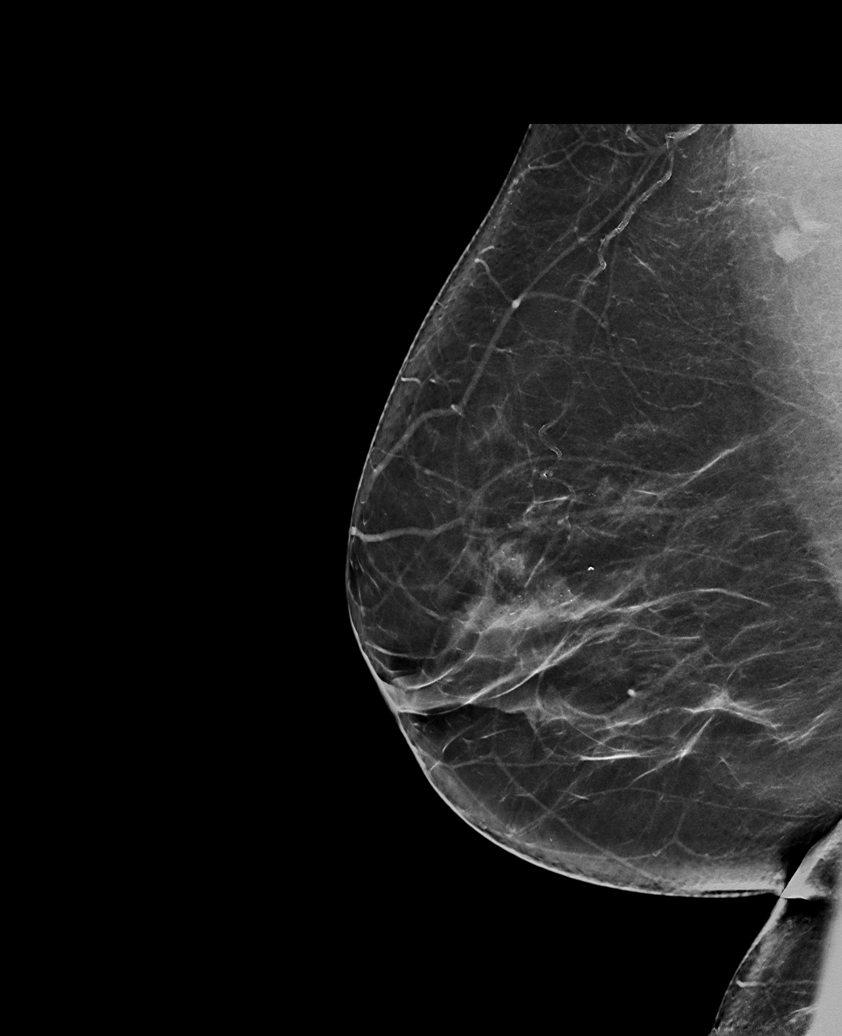

[R MLO synth-2D (2 of 2)]
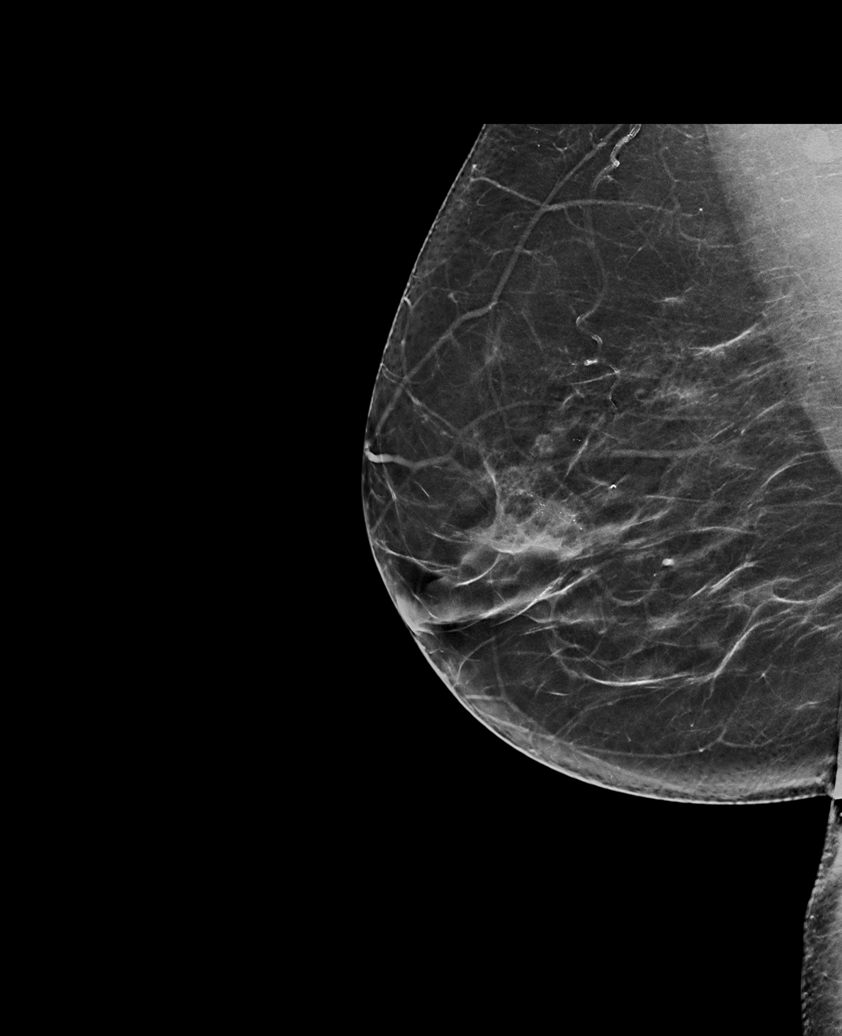

[R MLO tomo · tomo slice 37/74.0]
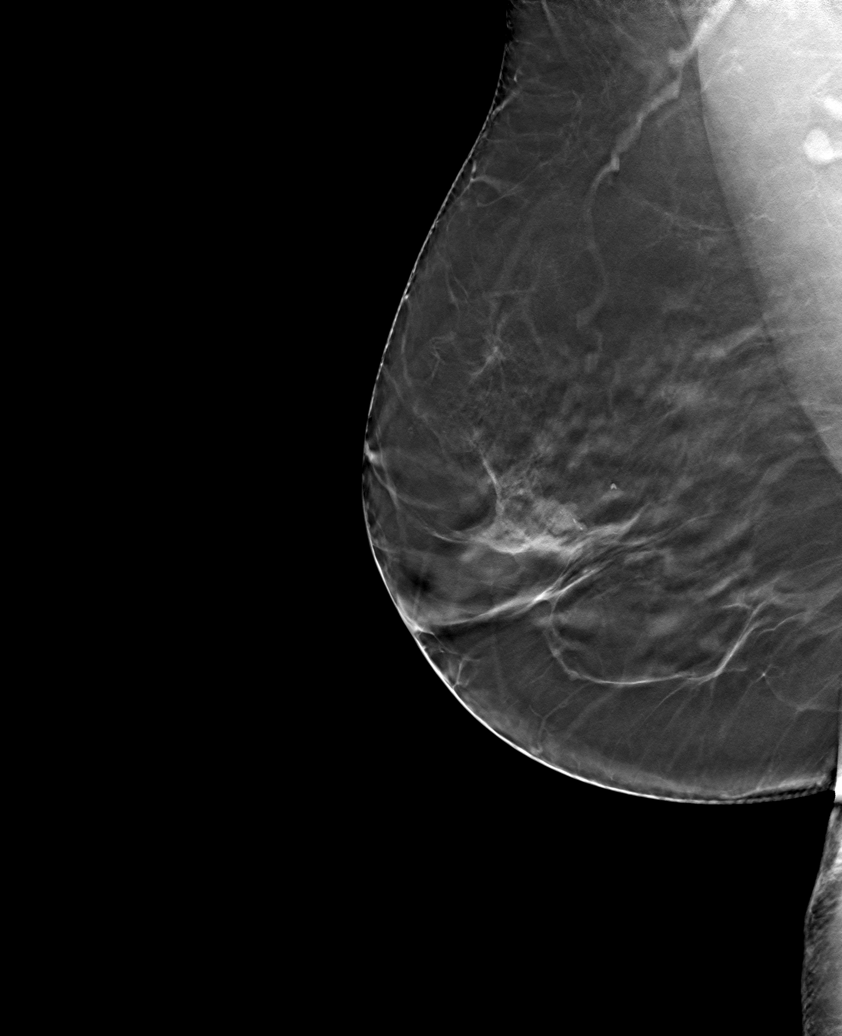

[6 of 30 positions shown; findings below may reference images not displayed]

ACR Breast Density Category b: There are scattered areas of
fibroglandular density.
FINDINGS: There are no findings suspicious for malignancy. Images were
processed with CAD.
IMPRESSION: No mammographic evidence of malignancy. A result letter of this
screening mammogram will be mailed directly to the patient.

RECOMMENDATION:
Screening mammogram in one year. (Code:[id number])

BI-RADS CATEGORY  1: Negative.

## 2020-06-07 ENCOUNTER — Other Ambulatory Visit: Payer: Self-pay | Admitting: Family Medicine

## 2020-06-07 MED FILL — POTASSIUM CHLORIDE CRYS ER: 20 | 90 days supply | Qty: 180 | Fill #0

## 2020-06-08 ENCOUNTER — Ambulatory Visit: Payer: 59 | Admitting: Family Medicine

## 2020-06-17 DIAGNOSIS — E559 Vitamin D deficiency, unspecified: Secondary | ICD-10-CM | POA: Diagnosis not present

## 2020-06-17 DIAGNOSIS — E785 Hyperlipidemia, unspecified: Secondary | ICD-10-CM | POA: Diagnosis not present

## 2020-06-17 DIAGNOSIS — I10 Essential (primary) hypertension: Secondary | ICD-10-CM | POA: Diagnosis not present

## 2020-06-18 LAB — CMP14+EGFR
ALT: 16 IU/L (ref 0–32)
AST: 20 IU/L (ref 0–40)
Albumin/Globulin Ratio: 1.5 (ref 1.2–2.2)
Albumin: 4.4 g/dL (ref 3.8–4.8)
Alkaline Phosphatase: 180 IU/L — ABNORMAL HIGH (ref 44–121)
BUN/Creatinine Ratio: 9 — ABNORMAL LOW (ref 12–28)
BUN: 8 mg/dL (ref 8–27)
Bilirubin Total: 0.6 mg/dL (ref 0.0–1.2)
CO2: 24 mmol/L (ref 20–29)
Calcium: 10 mg/dL (ref 8.7–10.3)
Chloride: 101 mmol/L (ref 96–106)
Creatinine, Ser: 0.87 mg/dL (ref 0.57–1.00)
GFR calc Af Amer: 82 mL/min/{1.73_m2} (ref >59)
GFR calc non Af Amer: 71 mL/min/{1.73_m2} (ref >59)
Globulin, Total: 3 g/dL (ref 1.5–4.5)
Glucose: 91 mg/dL (ref 65–99)
Potassium: 4.1 mmol/L (ref 3.5–5.2)
Sodium: 144 mmol/L (ref 134–144)
Total Protein: 7.4 g/dL (ref 6.0–8.5)

## 2020-06-18 LAB — LIPID PANEL
Chol/HDL Ratio: 3.5 ratio (ref 0.0–4.4)
Cholesterol, Total: 157 mg/dL (ref 100–199)
HDL: 45 mg/dL (ref >39)
LDL Chol Calc (NIH): 88 mg/dL (ref 0–99)
Triglycerides: 134 mg/dL (ref 0–149)
VLDL Cholesterol Cal: 24 mg/dL (ref 5–40)

## 2020-06-18 LAB — CBC
Hematocrit: 38 % (ref 34.0–46.6)
Hemoglobin: 12.6 g/dL (ref 11.1–15.9)
MCH: 28.8 pg (ref 26.6–33.0)
MCHC: 33.2 g/dL (ref 31.5–35.7)
MCV: 87 fL (ref 79–97)
Platelets: 430 10*3/uL (ref 150–450)
RBC: 4.38 x10E6/uL (ref 3.77–5.28)
RDW: 11.8 % (ref 11.7–15.4)
WBC: 11.3 10*3/uL — ABNORMAL HIGH (ref 3.4–10.8)

## 2020-06-18 LAB — VITAMIN D 25 HYDROXY (VIT D DEFICIENCY, FRACTURES): Vit D, 25-Hydroxy: 33.4 ng/mL (ref 30.0–100.0)

## 2020-06-18 LAB — TSH: TSH: 1.24 u[IU]/mL (ref 0.450–4.500)

## 2020-06-21 ENCOUNTER — Telehealth (INDEPENDENT_AMBULATORY_CARE_PROVIDER_SITE_OTHER): Payer: 59 | Admitting: Family Medicine

## 2020-06-21 ENCOUNTER — Encounter: Payer: Self-pay | Admitting: Family Medicine

## 2020-06-21 ENCOUNTER — Other Ambulatory Visit: Payer: Self-pay

## 2020-06-21 VITALS — Ht 63.0 in | Wt 182.0 lb

## 2020-06-21 DIAGNOSIS — E785 Hyperlipidemia, unspecified: Secondary | ICD-10-CM | POA: Diagnosis not present

## 2020-06-21 DIAGNOSIS — E669 Obesity, unspecified: Secondary | ICD-10-CM | POA: Diagnosis not present

## 2020-06-21 DIAGNOSIS — D75839 Thrombocytosis, unspecified: Secondary | ICD-10-CM

## 2020-06-21 DIAGNOSIS — R748 Abnormal levels of other serum enzymes: Secondary | ICD-10-CM

## 2020-06-21 DIAGNOSIS — K219 Gastro-esophageal reflux disease without esophagitis: Secondary | ICD-10-CM | POA: Diagnosis not present

## 2020-06-21 DIAGNOSIS — I10 Essential (primary) hypertension: Secondary | ICD-10-CM | POA: Diagnosis not present

## 2020-06-21 NOTE — Patient Instructions (Addendum)
Annual physical exam in office end August, call if you need me sooner  No medication changes, labs are excellent and health maintenance up to date  Please schedule your mammogram  Fasting lipid, cmp and EGFr and CBc 1 week before next visit  You are referred to GI re GERD and elevated alk Phos  It is important that you exercise regularly at least 30 minutes 5 times a week. If you develop chest pain, have severe difficulty breathing, or feel very tired, stop exercising immediately and seek medical attention  Think about what you will eat, plan ahead. Choose " clean, green, fresh or frozen" over canned, processed or packaged foods which are more sugary, salty and fatty. 70 to 75% of food eaten should be vegetables and fruit. Three meals at set times with snacks allowed between meals, but they must be fruit or vegetables. Aim to eat over a 12 hour period , example 7 am to 7 pm, and STOP after  your last meal of the day. Drink water,generally about 64 ounces per day, no other drink is as healthy. Fruit juice is best enjoyed in a healthy way, by EATING the fruit. Thanks for choosing [facility], we consider it a privelige to serve you.

## 2020-06-21 NOTE — Assessment & Plan Note (Signed)
Needs re eval by GI, requires high dose PPI

## 2020-06-21 NOTE — Assessment & Plan Note (Signed)
Number continues to rise refer to GI for ongoing follow up

## 2020-06-26 ENCOUNTER — Encounter: Payer: Self-pay | Admitting: Family Medicine

## 2020-06-26 NOTE — Assessment & Plan Note (Signed)
Hyperlipidemia:Low fat diet discussed and encouraged.   Lipid Panel  Lab Results  Component Value Date   CHOL 157 06/17/2020   HDL 45 06/17/2020   LDLCALC 88 06/17/2020   TRIG 134 06/17/2020   CHOLHDL 3.5 06/17/2020   Controlled, no change in medication

## 2020-06-26 NOTE — Progress Notes (Signed)
Virtual Visit via Telephone Note  I connected with Kristin Hall on 06/26/20 at  2:20 PM EST by telephone and verified that I am speaking with the correct person using two identifiers.  Location: Patient: home Provider: office   I discussed the limitations, risks, security and privacy concerns of performing an evaluation and management service by telephone and the availability of in person appointments. I also discussed with the patient that there may be a patient responsible charge related to this service. The patient expressed understanding and agreed to proceed.   History of Present Illness: F/U chronic problems and address any new or current concerns. Review and update medications and allergies. Review recent lab and radiologic data . Update routine health maintainace. Review an encourage improved health habits to include nutrition, exercise and  sleep .  Denies recent fever or chills. No new concerns, no complaints, plans to commit to increased and consistent exercise Denies sinus pressure, nasal congestion, ear pain or sore throat. Denies chest congestion, productive cough or wheezing. Denies chest pains, palpitations and leg swelling Denies abdominal pain, nausea, vomiting,diarrhea or constipation.   Denies dysuria, frequency, hesitancy or incontinence. Denies joint pain, swelling and limitation in mobility. Denies headaches, seizures, numbness, or tingling. Denies depression, anxiety or insomnia. Denies skin break down or rash.       Observations/Objective: Ht 5\' 3"  (1.6 m)   Wt 182 lb (82.6 kg)   BMI 32.24 kg/m  Good communication with no confusion and intact memory. Alert and oriented x 3 No signs of respiratory distress during speech    Assessment and Plan: GERD Needs re eval by GI, requires high dose PPI  Elevated alkaline phosphatase level Number continues to rise refer to GI for ongoing follow up  Essential hypertension DASH diet and commitment to  daily physical activity for a minimum of 30 minutes discussed and encouraged, as a part of hypertension management. The importance of attaining a healthy weight is also discussed.  BP/Weight 06/21/2020 12/04/2019 09/08/2019 04/21/2019 11/11/2018 05/27/2018 95/05/8839  Systolic BP - 660 630 160 109 323 557  Diastolic BP - 66 56 82 82 82 84  Wt. (Lbs) 182 185 - 180 180 179 180  BMI 32.24 32.77 31.89 31.89 31.89 31.71 31.89   Generally controlled , no med change    Hyperlipemia Hyperlipidemia:Low fat diet discussed and encouraged.   Lipid Panel  Lab Results  Component Value Date   CHOL 157 06/17/2020   HDL 45 06/17/2020   LDLCALC 88 06/17/2020   TRIG 134 06/17/2020   CHOLHDL 3.5 06/17/2020   Controlled, no change in medication     Obesity (BMI 30.0-34.9)  Patient re-educated about  the importance of commitment to a  minimum of 150 minutes of exercise per week as able.  The importance of healthy food choices with portion control discussed, as well as eating regularly and within a 12 hour window most days. The need to choose "clean , green" food 50 to 75% of the time is discussed, as well as to make water the primary drink and set a goal of 64 ounces water daily.    Weight /BMI 06/21/2020 12/04/2019 09/08/2019  WEIGHT 182 lb 185 lb -  HEIGHT 5\' 3"  5\' 3"  5\' 3"   BMI 32.24 kg/m2 32.77 kg/m2 31.89 kg/m2        Follow Up Instructions:    I discussed the assessment and treatment plan with the patient. The patient was provided an opportunity to ask questions and all were answered.  The patient agreed with the plan and demonstrated an understanding of the instructions.   The patient was advised to call back or seek an in-person evaluation if the symptoms worsen or if the condition fails to improve as anticipated.  I provided 15 minutes of non-face-to-face time during this encounter.   Tula Nakayama, MD

## 2020-06-26 NOTE — Assessment & Plan Note (Signed)
  Patient re-educated about  the importance of commitment to a  minimum of 150 minutes of exercise per week as able.  The importance of healthy food choices with portion control discussed, as well as eating regularly and within a 12 hour window most days. The need to choose "clean , green" food 50 to 75% of the time is discussed, as well as to make water the primary drink and set a goal of 64 ounces water daily.    Weight /BMI 06/21/2020 12/04/2019 09/08/2019  WEIGHT 182 lb 185 lb -  HEIGHT 5\' 3"  5\' 3"  5\' 3"   BMI 32.24 kg/m2 32.77 kg/m2 31.89 kg/m2

## 2020-06-26 NOTE — Assessment & Plan Note (Signed)
DASH diet and commitment to daily physical activity for a minimum of 30 minutes discussed and encouraged, as a part of hypertension management. The importance of attaining a healthy weight is also discussed.  BP/Weight 06/21/2020 12/04/2019 09/08/2019 04/21/2019 11/11/2018 05/27/2018 83/0/7354  Systolic BP - 301 484 039 795 369 223  Diastolic BP - 66 56 82 82 82 84  Wt. (Lbs) 182 185 - 180 180 179 180  BMI 32.24 32.77 31.89 31.89 31.89 31.71 31.89   Generally controlled , no med change

## 2020-06-28 ENCOUNTER — Encounter: Payer: Self-pay | Admitting: Internal Medicine

## 2020-06-29 ENCOUNTER — Telehealth: Payer: Self-pay | Admitting: Gastroenterology

## 2020-06-29 DIAGNOSIS — R748 Abnormal levels of other serum enzymes: Secondary | ICD-10-CM

## 2020-06-29 NOTE — Telephone Encounter (Signed)
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 She tells me that her alk phos has increased. History of elevated alk phos in the past. AMA negative.   Let's update RUQ [place] due to elevated alk phos. She is available these days: 3/4, 3/7, 3/18, and 3/21.

## 2020-06-30 ENCOUNTER — Encounter: Payer: Self-pay | Admitting: Family Medicine

## 2020-06-30 NOTE — Telephone Encounter (Signed)
[place] abd RUQ scheduled for 07/16/20 at 9:30am, arrive at 9:15am. NPO after midnight prior to test.  Sent MyChart message to inform her of [place] appt. Letter also mailed.

## 2020-06-30 NOTE — Addendum Note (Signed)
Addended by: Hassan Rowan on: 06/30/2020 07:55 AM   Modules accepted: Orders

## 2020-07-16 ENCOUNTER — Ambulatory Visit (HOSPITAL_COMMUNITY)
Admission: RE | Admit: 2020-07-16 | Discharge: 2020-07-16 | Disposition: A | Discharge status: Home / Self Care | Payer: 59 | Source: Ambulatory Visit | Attending: Gastroenterology | Admitting: Gastroenterology

## 2020-07-16 ENCOUNTER — Other Ambulatory Visit: Payer: Self-pay

## 2020-07-16 DIAGNOSIS — R748 Abnormal levels of other serum enzymes: Secondary | ICD-10-CM | POA: Insufficient documentation

## 2020-07-16 DIAGNOSIS — K760 Fatty (change of) liver, not elsewhere classified: Secondary | ICD-10-CM | POA: Diagnosis not present

## 2020-07-28 ENCOUNTER — Ambulatory Visit: Payer: 59 | Admitting: Internal Medicine

## 2020-08-05 ENCOUNTER — Other Ambulatory Visit: Payer: Self-pay

## 2020-08-05 ENCOUNTER — Ambulatory Visit: Payer: 59 | Admitting: Internal Medicine

## 2020-08-05 ENCOUNTER — Encounter: Payer: Self-pay | Admitting: Internal Medicine

## 2020-08-05 VITALS — BP 128/79 | HR 88 | Temp 97.0°F | Ht 63.0 in | Wt 192.8 lb

## 2020-08-05 DIAGNOSIS — K219 Gastro-esophageal reflux disease without esophagitis: Secondary | ICD-10-CM

## 2020-08-05 DIAGNOSIS — Z860100 Personal history of colon polyps, unspecified: Secondary | ICD-10-CM

## 2020-08-05 DIAGNOSIS — R748 Abnormal levels of other serum enzymes: Secondary | ICD-10-CM | POA: Diagnosis not present

## 2020-08-05 NOTE — Progress Notes (Signed)
Referring Provider: Fayrene Helper, MD Primary Care Physician:  Fayrene Helper, MD Primary GI:  Dr. Abbey Chatters  Chief Complaint  Patient presents with  . Gastroesophageal Reflux    Doing fine  . elevated alkaline phosphatase    HPI:   Kristin Hall is a very pleasant 65 y.o. female who presents to clinic for follow-up visit.  She has a history of chronic alkaline phosphatase elevation.This has been mild in the past.  She has been worked up extensively including CT scan, MRI/MRCP which have been WNL.  Recent ultrasound 07/16/2020 showed fatty liver otherwise unremarkable.  Most recent alkaline phosphatase level of 180, 142 last year.  She has been AMA negative in the past.  Patient has no complaints for me today.  Denies any itching.  Does have chronic reflux for which is well controlled on daily pantoprazole.  Does not need refills today.  Last colonoscopy 2020 with tubular adenoma removed.  Recall 2025  Past Medical History:  Diagnosis Date  . Elevated alkaline phosphatase level   . Headache   . Hyperlipidemia   . Hypertension   . OSA on CPAP   . Thrombocytosis 07/23/2015    Past Surgical History:  Procedure Laterality Date  . ABDOMINAL HYSTERECTOMY     partial   . COLONOSCOPY  10/04/2006   SLF: Normal retroflexed view of the rectum/Normal colon without evidence of polyps, masses, inflammatory changes, diverticula or arteriovenous malformations  . COLONOSCOPY N/A 12/07/2016   Dr. Oneida Alar: 15 mm polyp in cecum, s/p APC and clips. One 30 mm polyp in sigmoid, s/p clip, injection, tattooed. Path with one adenoma and one hamartomatous polyp. Surveillance 2021.   Marland Kitchen COLONOSCOPY N/A 09/08/2019   Procedure: COLONOSCOPY;  Surgeon: Danie Binder, MD;  Location: [facility];  Service: Endoscopy;  Laterality: N/A;  8:30AM  . POLYPECTOMY  12/07/2016   Procedure: POLYPECTOMY;  Surgeon: Danie Binder, MD;  Location: [facility];  Service: Endoscopy;;  cecal;  . POLYPECTOMY   09/08/2019   Procedure: POLYPECTOMY;  Surgeon: Danie Binder, MD;  Location: [facility];  Service: Endoscopy;;    Current Outpatient Medications  Medication Sig Dispense Refill  . acetaminophen (TYLENOL) 500 MG tablet Take 1 tablet (500 mg total) by mouth every 8 (eight) hours as needed. 30 tablet 0  . amLODipine (NORVASC) 2.5 MG tablet TAKE 1 TABLET BY MOUTH DAILY. 90 tablet 3  . diltiazem (TIAZAC) 300 MG 24 hr capsule TAKE 1 CAPSULE BY MOUTH DAILY. 90 capsule 3  . ezetimibe-simvastatin (VYTORIN) 10-10 MG tablet TAKE 1 TABLET BY MOUTH AT BEDTIME FOR CHOLESTEROL. 30 tablet 1  . Multiple Vitamins-Minerals (ALIVE WOMENS 50+) TABS Take 1 tablet by mouth daily.    Marland Kitchen olmesartan-hydrochlorothiazide (BENICAR HCT) 40-25 MG tablet TAKE 1 TABLET BY MOUTH DAILY. 90 tablet 3  . pantoprazole (PROTONIX) 40 MG tablet TAKE 1 TABLET (40 MG TOTAL) BY MOUTH DAILY. 90 tablet 0  . potassium chloride SA (KLOR-CON) 20 MEQ tablet TAKE 1 TABLET (20 MEQ TOTAL) BY MOUTH 2 (TWO) TIMES DAILY. 180 tablet 1  . UNABLE TO FIND Blood pressure monitor x 1  DX I10 1 each 0   No current facility-administered medications for this visit.    Allergies as of 08/05/2020 - Review Complete 08/05/2020  Allergen Reaction Noted  . Aspirin Other (See Comments) 09/22/2013  . Metronidazole Hives and Itching 11/12/2007  . Valsartan Cough 04/19/2015    Family History  Problem Relation Age of Onset  .  Cancer Mother        Breast  . Dementia Mother   . Cancer Father        Lung Cancer, deceased at 43  . Colon cancer Neg Hx   . Liver disease Neg Hx     Social History   Socioeconomic History  . Marital status: Widowed    Spouse name: Not on file  . Number of children: Not on file  . Years of education: Not on file  . Highest education level: Not on file  Occupational History  . Not on file  Tobacco Use  . Smoking status: Never Smoker  . Smokeless tobacco: Never Used  Substance and Sexual Activity  . Alcohol use: No   . Drug use: No  . Sexual activity: Yes  Other Topics Concern  . Not on file  Social History Narrative  . Not on file   Social Determinants of Health   Financial Resource Strain: Not on file  Food Insecurity: Not on file  Transportation Needs: Not on file  Physical Activity: Not on file  Stress: Not on file  Social Connections: Not on file    Subjective: Review of Systems  Constitutional: Negative for chills and fever.  HENT: Negative for congestion and hearing loss.   Eyes: Negative for blurred vision and double vision.  Respiratory: Negative for cough and shortness of breath.   Cardiovascular: Negative for chest pain and palpitations.  Gastrointestinal: Negative for abdominal pain, blood in stool, constipation, diarrhea, heartburn, melena and vomiting.  Genitourinary: Negative for dysuria and urgency.  Musculoskeletal: Negative for joint pain and myalgias.  Skin: Negative for itching and rash.  Neurological: Negative for dizziness and headaches.  Psychiatric/Behavioral: Negative for depression. The patient is not nervous/anxious.      Objective: BP 128/79   Pulse 88   Temp (!) 97 F (36.1 C)   Ht 5\' 3"  (1.6 m)   Wt 192 lb 12.8 oz (87.5 kg)   BMI 34.15 kg/m  Physical Exam Constitutional:      Appearance: Normal appearance.  HENT:     Head: Normocephalic and atraumatic.  Eyes:     Extraocular Movements: Extraocular movements intact.     Conjunctiva/sclera: Conjunctivae normal.  Cardiovascular:     Rate and Rhythm: Normal rate and regular rhythm.  Pulmonary:     Effort: Pulmonary effort is normal.     Breath sounds: Normal breath sounds.  Abdominal:     General: Bowel sounds are normal.     Palpations: Abdomen is soft.  Musculoskeletal:        General: No swelling. Normal range of motion.     Cervical back: Normal range of motion and neck supple.  Skin:    General: Skin is warm and dry.     Coloration: Skin is not jaundiced.  Neurological:     General:  No focal deficit present.     Mental Status: She is alert and oriented to person, place, and time.  Psychiatric:        Mood and Affect: Mood normal.        Behavior: Behavior normal.      Assessment: *Elevated alkaline phosphatase *Chronic GERD-well-controlled on pantoprazole daily *History of adenomatous colon polyp  Plan: Discussed alkaline phosphatase in depth with patient today.  This is only a mild elevation though it is slightly worse than priorr.  She has had extensive work-up including MRI/MRCP, CT, as well as recent ultrasound which showed fatty liver disease.  Mitochondrial antibody  negative.    Recommended that we continue to monitor this.  We will repeat alkaline phosphatase in 6 months as well as GGT level.  Recommend patient be fasting for this.  GERD well-controlled on daily pantoprazole.  We will continue.  Colonoscopy recall 2025 for history of adenomatous colon polyps.  Patient follow-up in 6 months with blood work 1 week prior.  08/05/2020 3:38 PM   Disclaimer: This note was dictated with voice recognition software. Similar sounding words can inadvertently be transcribed and may not be corrected upon review.

## 2020-08-05 NOTE — Patient Instructions (Signed)
Continue on pantoprazole 40 mg daily for chronic reflux.  We will plan on rechecking your alk phos level in 6 months.  At the same time I will perform GGT to ensure that this is actually coming from your liver.  I recommend that you fast before these labs.  At [facility] we value your feedback. You may receive a survey about your visit today. Please share your experience as we strive to create trusting relationships with our patients to provide genuine, compassionate, quality care.  We appreciate your understanding and patience as we review any laboratory studies, imaging, and other diagnostic tests that are ordered as we care for you. Our office policy is 5 business days for review of these results, and any emergent or urgent results are addressed in a timely manner for your best interest. If you do not hear from our office in 1 week, please contact us.   We also encourage the use of MyChart, which contains your medical information for your review as well. If you are not enrolled in this feature, an access code is on this after visit summary for your convenience. Thank you for allowing [place] to be involved in your care.  It was great to see you today!  I hope you have a great rest of your spring!!    Elon Alas. Abbey Chatters, D.O. Gastroenterology and Hepatology [facility]

## 2020-08-10 ENCOUNTER — Other Ambulatory Visit: Payer: Self-pay | Admitting: Family Medicine

## 2020-08-10 DIAGNOSIS — K219 Gastro-esophageal reflux disease without esophagitis: Secondary | ICD-10-CM

## 2020-08-14 ENCOUNTER — Other Ambulatory Visit (HOSPITAL_COMMUNITY): Payer: Self-pay

## 2020-08-16 ENCOUNTER — Other Ambulatory Visit (HOSPITAL_COMMUNITY): Payer: Self-pay

## 2020-08-16 MED FILL — Pantoprazole Sodium EC Tab 40 MG (Base Equiv): ORAL | 90 days supply | Qty: 90 | Fill #0 | Status: AC

## 2020-08-16 MED FILL — Amlodipine Besylate Tab 2.5 MG (Base Equivalent): ORAL | 90 days supply | Qty: 90 | Fill #0 | Status: AC

## 2020-08-16 MED FILL — Olmesartan Medoxomil-Hydrochlorothiazide Tab 40-25 MG: ORAL | 90 days supply | Qty: 90 | Fill #0 | Status: AC

## 2020-08-24 ENCOUNTER — Other Ambulatory Visit (HOSPITAL_COMMUNITY): Payer: Self-pay

## 2020-09-06 ENCOUNTER — Other Ambulatory Visit (HOSPITAL_COMMUNITY): Payer: Self-pay

## 2020-09-06 MED FILL — Potassium Chloride Microencapsulated Crys ER Tab 20 mEq: ORAL | 90 days supply | Qty: 180 | Fill #0 | Status: AC

## 2020-10-18 ENCOUNTER — Other Ambulatory Visit (HOSPITAL_COMMUNITY): Payer: Self-pay

## 2020-10-18 ENCOUNTER — Other Ambulatory Visit: Payer: Self-pay | Admitting: Family Medicine

## 2020-10-18 MED ORDER — EZETIMIBE-SIMVASTATIN 10-10 MG PO TABS
1.0000 | ORAL_TABLET | Freq: Every evening | ORAL | 5 refills | Status: DC
  Filled 2020-10-18: qty 30, 30d supply, fill #0
  Filled 2020-12-27: qty 30, 30d supply, fill #1
  Filled 2021-03-15: qty 30, 30d supply, fill #2
  Filled 2021-05-23: qty 30, 30d supply, fill #3
  Filled 2021-08-16: qty 30, 30d supply, fill #4

## 2020-10-31 MED FILL — Diltiazem HCl Extended Release Beads Cap ER 24HR 300 MG: ORAL | 90 days supply | Qty: 90 | Fill #0 | Status: AC

## 2020-11-01 ENCOUNTER — Other Ambulatory Visit (HOSPITAL_COMMUNITY): Payer: Self-pay

## 2020-11-11 ENCOUNTER — Other Ambulatory Visit (HOSPITAL_COMMUNITY): Payer: Self-pay

## 2020-11-11 ENCOUNTER — Other Ambulatory Visit: Payer: Self-pay | Admitting: Family Medicine

## 2020-11-11 DIAGNOSIS — K219 Gastro-esophageal reflux disease without esophagitis: Secondary | ICD-10-CM

## 2020-11-11 MED ORDER — AMLODIPINE BESYLATE 2.5 MG PO TABS
2.5000 mg | ORAL_TABLET | Freq: Every day | ORAL | 3 refills | Status: DC
  Filled 2020-11-11: qty 90, 90d supply, fill #0
  Filled 2021-02-14: qty 90, 90d supply, fill #1
  Filled 2021-05-23: qty 90, 90d supply, fill #2
  Filled 2021-08-16: qty 90, 90d supply, fill #3

## 2020-11-11 MED ORDER — OLMESARTAN MEDOXOMIL-HCTZ 40-25 MG PO TABS
1.0000 | ORAL_TABLET | Freq: Every day | ORAL | 3 refills | Status: DC
  Filled 2020-11-11: qty 90, 90d supply, fill #0
  Filled 2021-02-14: qty 90, 90d supply, fill #1
  Filled 2021-05-23: qty 90, 90d supply, fill #2
  Filled 2021-08-16: qty 90, 90d supply, fill #3

## 2020-11-11 MED ORDER — PANTOPRAZOLE SODIUM 40 MG PO TBEC
40.0000 mg | DELAYED_RELEASE_TABLET | Freq: Every day | ORAL | 0 refills | Status: DC
  Filled 2020-11-11: qty 90, 90d supply, fill #0

## 2020-11-29 ENCOUNTER — Other Ambulatory Visit (HOSPITAL_COMMUNITY): Payer: Self-pay

## 2020-11-29 ENCOUNTER — Other Ambulatory Visit: Payer: Self-pay | Admitting: Family Medicine

## 2020-11-29 MED ORDER — POTASSIUM CHLORIDE CRYS ER 20 MEQ PO TBCR
20.0000 meq | EXTENDED_RELEASE_TABLET | Freq: Two times a day (BID) | ORAL | 1 refills | Status: DC
  Filled 2020-11-29: qty 180, 90d supply, fill #0
  Filled 2021-03-01: qty 180, 90d supply, fill #1

## 2020-12-02 ENCOUNTER — Other Ambulatory Visit (HOSPITAL_COMMUNITY): Payer: Self-pay | Admitting: Family Medicine

## 2020-12-02 DIAGNOSIS — Z1231 Encounter for screening mammogram for malignant neoplasm of breast: Secondary | ICD-10-CM

## 2020-12-21 ENCOUNTER — Other Ambulatory Visit: Payer: Self-pay

## 2020-12-23 ENCOUNTER — Encounter: Payer: Self-pay | Admitting: Internal Medicine

## 2020-12-27 ENCOUNTER — Other Ambulatory Visit: Payer: Self-pay

## 2020-12-27 ENCOUNTER — Other Ambulatory Visit (HOSPITAL_COMMUNITY): Payer: Self-pay

## 2020-12-27 DIAGNOSIS — R748 Abnormal levels of other serum enzymes: Secondary | ICD-10-CM

## 2020-12-27 NOTE — Progress Notes (Unsigned)
ggt

## 2021-01-03 ENCOUNTER — Encounter: Payer: 59 | Admitting: Family Medicine

## 2021-01-21 DIAGNOSIS — R748 Abnormal levels of other serum enzymes: Secondary | ICD-10-CM | POA: Diagnosis not present

## 2021-01-22 LAB — HEPATIC FUNCTION PANEL
AG Ratio: 1.4 (calc) (ref 1.0–2.5)
ALT: 18 U/L (ref 6–29)
AST: 28 U/L (ref 10–35)
Albumin: 4.3 g/dL (ref 3.6–5.1)
Alkaline phosphatase (APISO): 146 U/L (ref 37–153)
Bilirubin, Direct: 0.2 mg/dL (ref 0.0–0.2)
Globulin: 3.1 g/dL (calc) (ref 1.9–3.7)
Indirect Bilirubin: 0.5 mg/dL (calc) (ref 0.2–1.2)
Total Bilirubin: 0.7 mg/dL (ref 0.2–1.2)
Total Protein: 7.4 g/dL (ref 6.1–8.1)

## 2021-01-22 LAB — GAMMA GT: GGT: 22 U/L (ref 3–65)

## 2021-01-28 ENCOUNTER — Ambulatory Visit (HOSPITAL_COMMUNITY)
Admission: RE | Admit: 2021-01-28 | Discharge: 2021-01-28 | Disposition: A | Discharge status: Home / Self Care | Payer: 59 | Source: Ambulatory Visit | Attending: Family Medicine | Admitting: Family Medicine

## 2021-01-28 ENCOUNTER — Other Ambulatory Visit: Payer: Self-pay

## 2021-01-28 DIAGNOSIS — Z1231 Encounter for screening mammogram for malignant neoplasm of breast: Secondary | ICD-10-CM

## 2021-01-30 ENCOUNTER — Other Ambulatory Visit: Payer: Self-pay | Admitting: Family Medicine

## 2021-01-31 ENCOUNTER — Other Ambulatory Visit (HOSPITAL_COMMUNITY): Payer: Self-pay

## 2021-01-31 MED ORDER — DILTIAZEM HCL ER BEADS 300 MG PO CP24
300.0000 mg | ORAL_CAPSULE | Freq: Every day | ORAL | 3 refills | Status: DC
  Filled 2021-01-31: qty 90, 90d supply, fill #0
  Filled 2021-05-05: qty 90, 90d supply, fill #1
  Filled 2021-08-01: qty 90, 90d supply, fill #2

## 2021-02-01 ENCOUNTER — Encounter: Payer: 59 | Admitting: Family Medicine

## 2021-02-11 ENCOUNTER — Encounter: Payer: Self-pay | Admitting: Family Medicine

## 2021-02-11 ENCOUNTER — Other Ambulatory Visit: Payer: Self-pay

## 2021-02-11 ENCOUNTER — Ambulatory Visit (INDEPENDENT_AMBULATORY_CARE_PROVIDER_SITE_OTHER): Payer: 59 | Admitting: Family Medicine

## 2021-02-11 VITALS — BP 137/87 | HR 95 | Resp 19 | Ht 63.0 in | Wt 183.1 lb

## 2021-02-11 DIAGNOSIS — G4733 Obstructive sleep apnea (adult) (pediatric): Secondary | ICD-10-CM

## 2021-02-11 DIAGNOSIS — I10 Essential (primary) hypertension: Secondary | ICD-10-CM

## 2021-02-11 DIAGNOSIS — Z9989 Dependence on other enabling machines and devices: Secondary | ICD-10-CM

## 2021-02-11 DIAGNOSIS — R748 Abnormal levels of other serum enzymes: Secondary | ICD-10-CM

## 2021-02-11 DIAGNOSIS — D75839 Thrombocytosis, unspecified: Secondary | ICD-10-CM | POA: Diagnosis not present

## 2021-02-11 DIAGNOSIS — Z0001 Encounter for general adult medical examination with abnormal findings: Secondary | ICD-10-CM

## 2021-02-11 DIAGNOSIS — I5189 Other ill-defined heart diseases: Secondary | ICD-10-CM | POA: Insufficient documentation

## 2021-02-11 DIAGNOSIS — E785 Hyperlipidemia, unspecified: Secondary | ICD-10-CM | POA: Diagnosis not present

## 2021-02-11 DIAGNOSIS — Z0000 Encounter for general adult medical examination without abnormal findings: Secondary | ICD-10-CM

## 2021-02-11 DIAGNOSIS — Z23 Encounter for immunization: Secondary | ICD-10-CM | POA: Diagnosis not present

## 2021-02-11 DIAGNOSIS — I38 Endocarditis, valve unspecified: Secondary | ICD-10-CM | POA: Insufficient documentation

## 2021-02-11 DIAGNOSIS — E669 Obesity, unspecified: Secondary | ICD-10-CM

## 2021-02-11 DIAGNOSIS — Z86010 Personal history of colonic polyps: Secondary | ICD-10-CM

## 2021-02-11 NOTE — Patient Instructions (Addendum)
Annual exam in 12 months, call if you need me sooner  F/u in 6 months, call if you need me sooner  VITAL that you use your CPAP machine to protect your heart, lungs and brain, and overall health  You are referred to cardiology for echocardiogram   Fasting lipid, chem7 and EGFr, Alk Phos, and cBc in 1 week  It is important that you exercise regularly at least 30 minutes 5 times a week. If you develop chest pain, have severe difficulty breathing, or feel very tired, stop exercising immediately and seek medical attention   Think about what you will eat, plan ahead. Choose " clean, green, fresh or frozen" over canned, processed or packaged foods which are more sugary, salty and fatty. 70 to 75% of food eaten should be vegetables and fruit. Three meals at set times with snacks allowed between meals, but they must be fruit or vegetables. Aim to eat over a 12 hour period , example 7 am to 7 pm, and STOP after  your last meal of the day. Drink water,generally about 64 ounces per day, no other drink is as healthy. Fruit juice is best enjoyed in a healthy way, by EATING the fruit. Thanks for choosing [facility], we consider it a privelige to serve you.

## 2021-02-11 NOTE — Assessment & Plan Note (Signed)

## 2021-02-11 NOTE — Assessment & Plan Note (Signed)
Chronic fatigue and non compliant with cPAP, needs re eval

## 2021-02-11 NOTE — Assessment & Plan Note (Signed)
rept CBC

## 2021-02-11 NOTE — Assessment & Plan Note (Signed)
Refer card for re assesment

## 2021-02-11 NOTE — Progress Notes (Signed)
Kristin Hall     MRN: [id number]      DOB: 1955/07/19  HPI: Patient is in for annual physical exam. No other health concerns are expressed or addressed at the visit. Labs will be updated next week Immunization is reviewed , and  is up to date   PE: BP 137/87   Pulse 95   Resp 19   Ht 5\' 3"  (1.6 m)   Wt 183 lb 1.9 oz (83.1 kg)   SpO2 93%   BMI 32.44 kg/m   Pleasant  female, alert and oriented x 3, in no cardio-pulmonary distress. Afebrile. HEENT No facial trauma or asymetry. Sinuses non tender.  Extra occullar muscles intact.. External ears normal, . Neck: supple, no adenopathy,JVD or thyromegaly.No bruits.  Chest: Clear to ascultation bilaterally.No crackles or wheezes. Non tender to palpation    Cardiovascular system; Heart sounds normal,  S1 and  S2 ,no S3.  systolic murmur, or thrill. Apical beat not displaced Peripheral pulses normal.  Abdomen: Soft, non tender  Musculoskeletal exam: Full ROM of spine, hips , shoulders and knees. No deformity ,swelling or crepitus noted. No muscle wasting or atrophy.   Neurologic: Cranial nerves 2 to 12 intact. Power, tone ,sensation and reflexes normal throughout. No disturbance in gait. No tremor.  Skin: Intact, no ulceration, erythema , scaling or rash noted. Pigmentation normal throughout  Psych; Normal mood and affect. Judgement and concentration normal   Assessment & Plan:  Annual physical exam Annual exam as documented. Counseling done  re healthy lifestyle involving commitment to 150 minutes exercise per week, heart healthy diet, and attaining healthy weight.The importance of adequate sleep also discussed. Regular seat belt use and home safety, is also discussed. Changes in health habits are decided on by the patient with goals and time frames  set for achieving them. Immunization and cancer screening needs are specifically addressed at this visit.   Diastolic dysfunction without heart  failure Chronic fatigue and non compliant with cPAP, needs re eval  Heart valve disorder Refer card for re assesment  OSA on CPAP Reports non compliance and chronic fatigue, importance of compliance explained and pt states she will use her machine, has upcoming appt with Neurology  Thrombocytosis (HCC) rept CBC  Elevated alkaline phosphatase level rept level with  Next lab draw  Essential hypertension DASH diet and commitment to daily physical activity for a minimum of 30 minutes discussed and encouraged, as a part of hypertension management. The importance of attaining a healthy weight is also discussed.  BP/Weight 02/11/2021 08/05/2020 06/21/2020 12/04/2019 09/08/2019 04/21/2019 11/11/2018  Systolic BP 137 128 - 133 101 329 122  Diastolic BP 87 79 - 66 56 82 82  Wt. (Lbs) 183.12 192.8 182 185 - 180 180  BMI 32.44 34.15 32.24 32.77 31.89 31.89 31.89     Controlled, no change in medication

## 2021-02-11 NOTE — Assessment & Plan Note (Signed)
Reports non compliance and chronic fatigue, importance of compliance explained and pt states she will use her machine, has upcoming appt with Neurology

## 2021-02-11 NOTE — Assessment & Plan Note (Signed)
DASH diet and commitment to daily physical activity for a minimum of 30 minutes discussed and encouraged, as a part of hypertension management. The importance of attaining a healthy weight is also discussed.  BP/Weight 02/11/2021 08/05/2020 06/21/2020 12/04/2019 09/08/2019 04/21/2019 7/65/4650  Systolic BP 354 656 - 812 751 700 174  Diastolic BP 87 79 - 66 56 82 82  Wt. (Lbs) 183.12 192.8 182 185 - 180 180  BMI 32.44 34.15 32.24 32.77 31.89 31.89 31.89     Controlled, no change in medication

## 2021-02-11 NOTE — Assessment & Plan Note (Signed)
rept level with  Next lab draw

## 2021-02-14 ENCOUNTER — Other Ambulatory Visit (HOSPITAL_COMMUNITY): Payer: Self-pay

## 2021-02-14 ENCOUNTER — Other Ambulatory Visit: Payer: Self-pay | Admitting: Family Medicine

## 2021-02-14 DIAGNOSIS — K219 Gastro-esophageal reflux disease without esophagitis: Secondary | ICD-10-CM

## 2021-02-14 MED ORDER — PANTOPRAZOLE SODIUM 40 MG PO TBEC
40.0000 mg | DELAYED_RELEASE_TABLET | Freq: Every day | ORAL | 0 refills | Status: DC
  Filled 2021-02-14: qty 90, 90d supply, fill #0

## 2021-02-25 DIAGNOSIS — R748 Abnormal levels of other serum enzymes: Secondary | ICD-10-CM | POA: Diagnosis not present

## 2021-02-25 DIAGNOSIS — E785 Hyperlipidemia, unspecified: Secondary | ICD-10-CM | POA: Diagnosis not present

## 2021-02-25 DIAGNOSIS — I10 Essential (primary) hypertension: Secondary | ICD-10-CM | POA: Diagnosis not present

## 2021-02-26 LAB — BMP8+EGFR
BUN/Creatinine Ratio: 12 (ref 12–28)
BUN: 10 mg/dL (ref 8–27)
CO2: 23 mmol/L (ref 20–29)
Calcium: 10.1 mg/dL (ref 8.7–10.3)
Chloride: 101 mmol/L (ref 96–106)
Creatinine, Ser: 0.85 mg/dL (ref 0.57–1.00)
Glucose: 103 mg/dL — ABNORMAL HIGH (ref 70–99)
Potassium: 4.2 mmol/L (ref 3.5–5.2)
Sodium: 141 mmol/L (ref 134–144)
eGFR: 76 mL/min/{1.73_m2} (ref >59)

## 2021-02-26 LAB — CBC
Hematocrit: 38 % (ref 34.0–46.6)
Hemoglobin: 12.8 g/dL (ref 11.1–15.9)
MCH: 29.7 pg (ref 26.6–33.0)
MCHC: 33.7 g/dL (ref 31.5–35.7)
MCV: 88 fL (ref 79–97)
Platelets: 431 10*3/uL (ref 150–450)
RBC: 4.31 x10E6/uL (ref 3.77–5.28)
RDW: 12.1 % (ref 11.7–15.4)
WBC: 10.3 10*3/uL (ref 3.4–10.8)

## 2021-02-26 LAB — LIPID PANEL
Chol/HDL Ratio: 3.4 ratio (ref 0.0–4.4)
Cholesterol, Total: 139 mg/dL (ref 100–199)
HDL: 41 mg/dL (ref >39)
LDL Chol Calc (NIH): 82 mg/dL (ref 0–99)
Triglycerides: 80 mg/dL (ref 0–149)
VLDL Cholesterol Cal: 16 mg/dL (ref 5–40)

## 2021-02-26 LAB — ALKALINE PHOSPHATASE: Alkaline Phosphatase: 170 IU/L — ABNORMAL HIGH (ref 44–121)

## 2021-03-01 ENCOUNTER — Other Ambulatory Visit (HOSPITAL_COMMUNITY): Payer: Self-pay

## 2021-03-11 DIAGNOSIS — G4733 Obstructive sleep apnea (adult) (pediatric): Secondary | ICD-10-CM | POA: Diagnosis not present

## 2021-03-11 DIAGNOSIS — I10 Essential (primary) hypertension: Secondary | ICD-10-CM | POA: Diagnosis not present

## 2021-03-11 DIAGNOSIS — R5383 Other fatigue: Secondary | ICD-10-CM | POA: Diagnosis not present

## 2021-03-11 DIAGNOSIS — E669 Obesity, unspecified: Secondary | ICD-10-CM | POA: Diagnosis not present

## 2021-03-14 ENCOUNTER — Ambulatory Visit: Payer: 59 | Admitting: Cardiology

## 2021-03-14 ENCOUNTER — Other Ambulatory Visit: Payer: Self-pay

## 2021-03-14 ENCOUNTER — Encounter: Payer: Self-pay | Admitting: Cardiology

## 2021-03-14 DIAGNOSIS — G4733 Obstructive sleep apnea (adult) (pediatric): Secondary | ICD-10-CM

## 2021-03-14 DIAGNOSIS — I38 Endocarditis, valve unspecified: Secondary | ICD-10-CM

## 2021-03-14 DIAGNOSIS — Z9989 Dependence on other enabling machines and devices: Secondary | ICD-10-CM | POA: Diagnosis not present

## 2021-03-14 DIAGNOSIS — I10 Essential (primary) hypertension: Secondary | ICD-10-CM | POA: Diagnosis not present

## 2021-03-14 DIAGNOSIS — I5189 Other ill-defined heart diseases: Secondary | ICD-10-CM

## 2021-03-14 NOTE — Assessment & Plan Note (Signed)
Prior echocardiogram showed grade 1 diastolic dysfunction.  Mild LVH.  Repeating echocardiogram.  Mild stiffening to the heart.  Continue with treatment of hypertension with medications as above.

## 2021-03-14 NOTE — Assessment & Plan Note (Signed)
On Vytorin 10/10.  Doing well.  No myalgias.  Last LDL 82.

## 2021-03-14 NOTE — Progress Notes (Signed)
Cardiology Office Note:    Date:  03/14/2021   ID:  Kristin Hall, DOB [birth year], MRN [id number]  PCP:  Kerri Perches, MD   [facility] Providers Cardiologist:  None     Referring MD: Kerri Perches, MD    History of Present Illness:    Kristin Hall is a 65 y.o. female here for the evaluation of diastolic dysfunction, heart valve disorder at the request of Dr. Syliva Overman.  She has been experiencing chronic fatigue, has had trouble with compliance with CPAP.  Trying to get back on CPAP. Working on the pressure. When CPAP pressure was too strong this caused discomfort.   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  Next year she is planning on retirement.  She was previously seen here in clinic in 2018, prior evaluation in emergency room a chest pain 2017.  Hypokalemia was noted at that time.  She had chest wall tenderness and pain worsened by bending over and standing upright.  She was also hospitalized with the flu and acute kidney injury during that time her antihypertensives were discontinued temporarily.  She is now back on her usual regimen.  Prior bilateral renal artery duplex was completed and showed no evidence of renal artery stenosis in 2015.  No TOB No DM  Past Medical History:  Diagnosis Date   Elevated alkaline phosphatase level    Headache    Hyperlipidemia    Hypertension    OSA on CPAP    Thrombocytosis 07/23/2015    Past Surgical History:  Procedure Laterality Date   ABDOMINAL HYSTERECTOMY     partial    COLONOSCOPY  10/04/2006   SLF: Normal retroflexed view of the rectum/Normal colon without evidence of polyps, masses, inflammatory changes, diverticula or arteriovenous malformations   COLONOSCOPY N/A 12/07/2016   Dr. Darrick Penna: 15 mm polyp in cecum, s/p APC and clips. One 30 mm polyp in sigmoid, s/p clip, injection, tattooed. Path with one adenoma and one hamartomatous polyp. Surveillance 2021.    COLONOSCOPY N/A 09/08/2019   Procedure:  COLONOSCOPY;  Surgeon: Kristin Bali, MD;  [facility];  Service: Endoscopy;  Laterality: N/A;  8:30AM   POLYPECTOMY  12/07/2016   Procedure: POLYPECTOMY;  Surgeon: Kristin Bali, MD;  Location: [facility];  Service: Endoscopy;;  cecal;   POLYPECTOMY  09/08/2019   Procedure: POLYPECTOMY;  Surgeon: Kristin Bali, MD;  [facility]: [facility];  Service: Endoscopy;;    Current Medications: Current Meds  Medication Sig   acetaminophen (TYLENOL) 500 MG tablet Take 1 tablet (500 mg total) by mouth every 8 (eight) hours as needed.   amLODipine (NORVASC) 2.5 MG tablet Take 1 tablet (2.5 mg total) by mouth daily.   diltiazem (TIAZAC) 300 MG 24 hr capsule Take 1 capsule (300 mg total) by mouth daily.   ezetimibe-simvastatin (VYTORIN) 10-10 MG tablet Take 1 tablet by mouth at bedtime for cholesterol   Multiple Vitamins-Minerals (ALIVE WOMENS 50+) TABS Take 1 tablet by mouth daily.   olmesartan-hydrochlorothiazide (BENICAR HCT) 40-25 MG tablet Take 1 tablet by mouth daily.   pantoprazole (PROTONIX) 40 MG tablet Take 1 tablet (40 mg total) by mouth daily.   potassium chloride SA (KLOR-CON) 20 MEQ tablet Take 1 tablet (20 mEq total) by mouth 2 (two) times daily.   UNABLE TO FIND Blood pressure monitor x 1  DX I10     Allergies:   Aspirin, Metronidazole, and Valsartan   Social History   Socioeconomic History  Marital status: Widowed    Spouse name: Not on file   Number of children: Not on file   Years of education: Not on file   Highest education level: Not on file  Occupational History   Not on file  Tobacco Use   Smoking status: Never   Smokeless tobacco: Never  Vaping Use   Vaping Use: Never used  Substance and Sexual Activity   Alcohol use: No   Drug use: No   Sexual activity: Yes  Other Topics Concern   Not on file  Social History Narrative   Not on file   Social Determinants of Health   Financial Resource Strain: Not on file  Food Insecurity: Not on file   Transportation Needs: Not on file  Physical Activity: Not on file  Stress: Not on file  Social Connections: Not on file     Family History: The patient's family history includes Cancer in her father and mother; Dementia in her mother. There is no history of Colon cancer or Liver disease. No early heart ds.   ROS:   Please see the history of present illness.    No fevers chills nausea vomiting syncope.  Prior syncopal episode in the setting of dehydration during influenza.  All other systems reviewed and are negative.  EKGs/Labs/Other Studies Reviewed:    The following studies were reviewed today:  ECHO 2018: Study Result                      *[facility]*                          [address]                         [place], [place] [address]                             [phone]   -------------------------------------------------------------------  Transthoracic Echocardiography   Patient:    Kristin Hall  MR #:       [id number]  Study Date: 08/04/2016  Gender:     F  Age:        77  Height:     160 cm  Weight:     80.7 kg  BSA:        1.92 Hall^2  Pt. Status:  Room:    ATTENDING    Kristin Hall     Kristin Hall   REFERRING    Kristin Hall   PERFORMING   [facility], Kristin Hall   SONOGRAPHER  Kristin Hall, RCS   cc:   -------------------------------------------------------------------  LV EF: 65% -   70%   -------------------------------------------------------------------  Indications:      Hypertension.   -------------------------------------------------------------------  History:   PMH:  GERD, Syncope.  Risk factors:  Dyslipidemia.   -------------------------------------------------------------------  Study Conclusions   - Left ventricle: The cavity size was normal. Wall thickness was    increased in a pattern of mild LVH. Systolic function was    vigorous. The estimated ejection fraction was in the  range of 65%    to 70%. Wall motion was normal; there were no regional wall    motion abnormalities. Doppler parameters are consistent with    abnormal left ventricular relaxation (grade 1 diastolic    dysfunction).  -  Mitral valve: There was trivial regurgitation.  - Right atrium: Central venous pressure (est): 3 mm Hg.  - Tricuspid valve: There was trivial regurgitation.  - Pulmonary arteries: Systolic pressure could not be accurately    estimated.  - Pericardium, extracardiac: A prominent pericardial fat pad was    present.   Impressions:   - Mild LVH with LVEF 65-70% and grade 1 diastolic dysfunction.    Trivial mitral and tricuspid regurgitation.     EKG:  EKG is  ordered today.  The ekg ordered today demonstrates SR 95 Prior EKG on 06/19/2016 showed sinus tachycardia 105 with left atrial enlargement.  Personally reviewed.  Recent Labs: 06/17/2020: TSH 1.240 01/21/2021: ALT 18 02/25/2021: BUN 10; Creatinine, Ser 0.85; Hemoglobin 12.8; Platelets 431; Potassium 4.2; Sodium 141  Recent Lipid Panel    Component Value Date/Time   CHOL 139 02/25/2021 0956   TRIG 80 02/25/2021 0956   HDL 41 02/25/2021 0956   CHOLHDL 3.4 02/25/2021 0956   CHOLHDL 3.9 04/25/2019 0954   VLDL 18 04/25/2019 0954   LDLCALC 82 02/25/2021 0956   LDLCALC 99 02/15/2018 1013     Risk Assessment/Calculations:          Physical Exam:    VS:  BP 134/84 (BP Location: Right Arm)   Pulse 95   Ht 5\' 3"  (1.6 Hall)   Wt 184 lb (83.5 kg)   SpO2 98%   BMI 32.59 kg/Hall     Wt Readings from Last 3 Encounters:  03/14/21 184 lb (83.5 kg)  02/11/21 183 lb 1.9 oz (83.1 kg)  08/05/20 192 lb 12.8 oz (87.5 kg)     GEN:  Well nourished, well developed in no acute distress HEENT: Normal NECK: No JVD; No carotid bruits LYMPHATICS: No lymphadenopathy CARDIAC: RRR, very soft systolic murmur, no rubs, gallops RESPIRATORY:  Clear to auscultation without rales, wheezing or rhonchi  ABDOMEN: Soft, non-tender,  non-distended MUSCULOSKELETAL:  No edema; No deformity  SKIN: Warm and dry NEUROLOGIC:  Alert and oriented x 3 PSYCHIATRIC:  Normal affect   ASSESSMENT:    1. Heart valve disorder   2. Essential hypertension   3. OSA on CPAP   4. Diastolic dysfunction without heart failure    PLAN:    In order of problems listed above:  Heart valve disorder Prior echocardiogram in 2018 showed both trivial aortic and mitral regurgitation.  She also had grade 1 diastolic dysfunction then with mild LVH.  We will go ahead and repeat echocardiogram to ensure proper structure and function and no progression of valvular disorder.  Essential hypertension Currently on olmesartan hydrochlorothiazide 40/25 daily, diltiazem 300 mg daily, amlodipine 2.5 mg daily.  Also taking potassium 20 mEq 2 times daily.  1 possible suggestion is to include spironolactone which can help keep her potassium elevated as well as assist with blood pressure.  OSA on CPAP Continue to encourage compliance.  She is working hard to figure out solutions at this point.  Excellent.  Echocardiogram will be helpful to make sure that she does not have any elevated pulmonary pressures.  Diastolic dysfunction without heart failure Prior echocardiogram showed grade 1 diastolic dysfunction.  Mild LVH.  Repeating echocardiogram.  Mild stiffening to the heart.  Continue with treatment of hypertension with medications as above.  Hyperlipemia On Vytorin 10/10.  Doing well.  No myalgias.  Last LDL 82.   We will follow-up with results of study.      Medication Adjustments/Labs and Tests Ordered: Current medicines are reviewed  at length with the patient today.  Concerns regarding medicines are outlined above.  Orders Placed This Encounter  Procedures   EKG 12-Lead   ECHOCARDIOGRAM COMPLETE   No orders of the defined types were placed in this encounter.   Patient Instructions  Medication Instructions:  The current medical regimen is  effective;  continue present plan and medications.  *If you need a refill on your cardiac medications before your next appointment, please call your pharmacy*  Testing/Procedures: Your physician has requested that you have an echocardiogram. Echocardiography is a painless test that uses sound waves to create images of your heart. It provides your doctor with information about the size and shape of your heart and how well your heart's chambers and valves are working. This procedure takes approximately one hour. There are no restrictions for this procedure.  Follow-Up: At [facility], you and your health needs are our priority.  As part of our continuing mission to provide you with exceptional heart care, we have created designated Provider Care Teams.  These Care Teams include your primary Cardiologist (physician) and Advanced Practice Providers (APPs -  Physician Assistants and Nurse Practitioners) who all work together to provide you with the care you need, when you need it.  We recommend signing up for the patient portal called "MyChart".  Sign up information is provided on this After Visit Summary.  MyChart is used to connect with patients for Virtual Visits (Telemedicine).  Patients are able to view lab/test results, encounter notes, upcoming appointments, etc.  Non-urgent messages can be sent to your provider as well.   To learn more about what you can do with MyChart, go to [email or web address].    Your next appointment:   Follow up as needed.  Thank you for choosing [facility]!!     Signed, Donato Schultz, MD  03/14/2021 3:31 PM    [facility]

## 2021-03-14 NOTE — Assessment & Plan Note (Addendum)
Continue to encourage compliance.  She is working hard to figure out solutions at this point.  Excellent.  Echocardiogram will be helpful to make sure that she does not have any elevated pulmonary pressures.

## 2021-03-14 NOTE — Assessment & Plan Note (Signed)
Prior echocardiogram in 2018 showed both trivial aortic and mitral regurgitation.  She also had grade 1 diastolic dysfunction then with mild LVH.  We will go ahead and repeat echocardiogram to ensure proper structure and function and no progression of valvular disorder.

## 2021-03-14 NOTE — Patient Instructions (Signed)
Medication Instructions:  The current medical regimen is effective;  continue present plan and medications.  *If you need a refill on your cardiac medications before your next appointment, please call your pharmacy*  Testing/Procedures: Your physician has requested that you have an echocardiogram. Echocardiography is a painless test that uses sound waves to create images of your heart. It provides your doctor with information about the size and shape of your heart and how well your heart's chambers and valves are working. This procedure takes approximately one hour. There are no restrictions for this procedure.  Follow-Up: At [facility], you and your health needs are our priority.  As part of our continuing mission to provide you with exceptional heart care, we have created designated Provider Care Teams.  These Care Teams include your primary Cardiologist (physician) and Advanced Practice Providers (APPs -  Physician Assistants and Nurse Practitioners) who all work together to provide you with the care you need, when you need it.  We recommend signing up for the patient portal called "MyChart".  Sign up information is provided on this After Visit Summary.  MyChart is used to connect with patients for Virtual Visits (Telemedicine).  Patients are able to view lab/test results, encounter notes, upcoming appointments, etc.  Non-urgent messages can be sent to your provider as well.   To learn more about what you can do with MyChart, go to [email or web address].    Your next appointment:   Follow up as needed.  Thank you for choosing [facility]!!

## 2021-03-14 NOTE — Assessment & Plan Note (Signed)
Currently on olmesartan hydrochlorothiazide 40/25 daily, diltiazem 300 mg daily, amlodipine 2.5 mg daily.  Also taking potassium 20 mEq 2 times daily.  1 possible suggestion is to include spironolactone which can help keep her potassium elevated as well as assist with blood pressure.

## 2021-03-15 ENCOUNTER — Other Ambulatory Visit (HOSPITAL_COMMUNITY): Payer: Self-pay

## 2021-03-24 DIAGNOSIS — G4733 Obstructive sleep apnea (adult) (pediatric): Secondary | ICD-10-CM | POA: Diagnosis not present

## 2021-04-25 ENCOUNTER — Ambulatory Visit (HOSPITAL_COMMUNITY)
Admission: RE | Admit: 2021-04-25 | Discharge: 2021-04-25 | Disposition: A | Discharge status: Home / Self Care | Payer: 59 | Source: Ambulatory Visit | Attending: Cardiology | Admitting: Cardiology

## 2021-04-25 DIAGNOSIS — I38 Endocarditis, valve unspecified: Secondary | ICD-10-CM | POA: Diagnosis not present

## 2021-04-25 DIAGNOSIS — I5189 Other ill-defined heart diseases: Secondary | ICD-10-CM | POA: Insufficient documentation

## 2021-04-25 LAB — ECHOCARDIOGRAM COMPLETE
Area-P 1/2: 2.48 cm2
S' Lateral: 2.6 cm

## 2021-04-25 NOTE — Progress Notes (Signed)
*  PRELIMINARY RESULTS* Echocardiogram 2D Echocardiogram has been performed.  Samuel Germany 04/25/2021, 12:32 PM

## 2021-05-05 ENCOUNTER — Other Ambulatory Visit (HOSPITAL_COMMUNITY): Payer: Self-pay

## 2021-05-16 ENCOUNTER — Other Ambulatory Visit: Payer: Self-pay | Admitting: Family Medicine

## 2021-05-16 ENCOUNTER — Other Ambulatory Visit (HOSPITAL_COMMUNITY): Payer: Self-pay

## 2021-05-16 DIAGNOSIS — K219 Gastro-esophageal reflux disease without esophagitis: Secondary | ICD-10-CM

## 2021-05-16 MED ORDER — PANTOPRAZOLE SODIUM 40 MG PO TBEC
40.0000 mg | DELAYED_RELEASE_TABLET | Freq: Every day | ORAL | 0 refills | Status: DC
  Filled 2021-05-16: qty 90, 90d supply, fill #0

## 2021-05-23 ENCOUNTER — Other Ambulatory Visit (HOSPITAL_COMMUNITY): Payer: Self-pay

## 2021-06-07 ENCOUNTER — Other Ambulatory Visit: Payer: Self-pay | Admitting: Family Medicine

## 2021-06-07 ENCOUNTER — Other Ambulatory Visit (HOSPITAL_COMMUNITY): Payer: Self-pay

## 2021-06-07 MED ORDER — POTASSIUM CHLORIDE CRYS ER 20 MEQ PO TBCR
20.0000 meq | EXTENDED_RELEASE_TABLET | Freq: Two times a day (BID) | ORAL | 1 refills | Status: DC
  Filled 2021-06-07: qty 180, 90d supply, fill #0
  Filled 2021-09-05: qty 180, 90d supply, fill #1

## 2021-07-29 ENCOUNTER — Other Ambulatory Visit: Payer: Self-pay

## 2021-07-29 DIAGNOSIS — E785 Hyperlipidemia, unspecified: Secondary | ICD-10-CM

## 2021-07-29 DIAGNOSIS — I10 Essential (primary) hypertension: Secondary | ICD-10-CM | POA: Diagnosis not present

## 2021-07-30 LAB — CBC
Hematocrit: 39.4 % (ref 34.0–46.6)
Hemoglobin: 12.9 g/dL (ref 11.1–15.9)
MCH: 28.9 pg (ref 26.6–33.0)
MCHC: 32.7 g/dL (ref 31.5–35.7)
MCV: 88 fL (ref 79–97)
Platelets: 389 10*3/uL (ref 150–450)
RBC: 4.46 x10E6/uL (ref 3.77–5.28)
RDW: 12.1 % (ref 11.7–15.4)
WBC: 9.6 10*3/uL (ref 3.4–10.8)

## 2021-07-30 LAB — CMP14+EGFR
ALT: 16 IU/L (ref 0–32)
AST: 26 IU/L (ref 0–40)
Albumin/Globulin Ratio: 1.4 (ref 1.2–2.2)
Albumin: 4.4 g/dL (ref 3.8–4.8)
Alkaline Phosphatase: 165 IU/L — ABNORMAL HIGH (ref 44–121)
BUN/Creatinine Ratio: 12 (ref 12–28)
BUN: 10 mg/dL (ref 8–27)
Bilirubin Total: 0.4 mg/dL (ref 0.0–1.2)
CO2: 26 mmol/L (ref 20–29)
Calcium: 9.8 mg/dL (ref 8.7–10.3)
Chloride: 102 mmol/L (ref 96–106)
Creatinine, Ser: 0.86 mg/dL (ref 0.57–1.00)
Globulin, Total: 3.1 g/dL (ref 1.5–4.5)
Glucose: 95 mg/dL (ref 70–99)
Potassium: 4.3 mmol/L (ref 3.5–5.2)
Sodium: 143 mmol/L (ref 134–144)
Total Protein: 7.5 g/dL (ref 6.0–8.5)
eGFR: 75 mL/min/{1.73_m2} (ref >59)

## 2021-07-30 LAB — LIPID PANEL
Chol/HDL Ratio: 3.4 ratio (ref 0.0–4.4)
Cholesterol, Total: 161 mg/dL (ref 100–199)
HDL: 47 mg/dL (ref >39)
LDL Chol Calc (NIH): 97 mg/dL (ref 0–99)
Triglycerides: 90 mg/dL (ref 0–149)
VLDL Cholesterol Cal: 17 mg/dL (ref 5–40)

## 2021-08-01 ENCOUNTER — Other Ambulatory Visit: Payer: Self-pay | Admitting: Family Medicine

## 2021-08-01 ENCOUNTER — Other Ambulatory Visit (HOSPITAL_COMMUNITY): Payer: Self-pay

## 2021-08-01 DIAGNOSIS — K219 Gastro-esophageal reflux disease without esophagitis: Secondary | ICD-10-CM

## 2021-08-01 MED ORDER — PANTOPRAZOLE SODIUM 40 MG PO TBEC
40.0000 mg | DELAYED_RELEASE_TABLET | Freq: Every day | ORAL | 0 refills | Status: DC
  Filled 2021-08-01: qty 90, 90d supply, fill #0

## 2021-08-03 ENCOUNTER — Ambulatory Visit: Payer: 59 | Admitting: Family Medicine

## 2021-08-12 ENCOUNTER — Ambulatory Visit: Payer: 59 | Admitting: Family Medicine

## 2021-08-16 ENCOUNTER — Other Ambulatory Visit (HOSPITAL_COMMUNITY): Payer: Self-pay

## 2021-09-05 ENCOUNTER — Other Ambulatory Visit (HOSPITAL_COMMUNITY): Payer: Self-pay

## 2021-09-14 ENCOUNTER — Ambulatory Visit: Payer: 59 | Admitting: Family Medicine

## 2021-09-14 ENCOUNTER — Encounter: Payer: Self-pay | Admitting: Family Medicine

## 2021-09-14 VITALS — BP 120/76 | HR 83 | Ht 63.0 in | Wt 189.1 lb

## 2021-09-14 DIAGNOSIS — Z9989 Dependence on other enabling machines and devices: Secondary | ICD-10-CM | POA: Diagnosis not present

## 2021-09-14 DIAGNOSIS — K219 Gastro-esophageal reflux disease without esophagitis: Secondary | ICD-10-CM | POA: Diagnosis not present

## 2021-09-14 DIAGNOSIS — I10 Essential (primary) hypertension: Secondary | ICD-10-CM | POA: Diagnosis not present

## 2021-09-14 DIAGNOSIS — L989 Disorder of the skin and subcutaneous tissue, unspecified: Secondary | ICD-10-CM

## 2021-09-14 DIAGNOSIS — E785 Hyperlipidemia, unspecified: Secondary | ICD-10-CM | POA: Diagnosis not present

## 2021-09-14 DIAGNOSIS — E669 Obesity, unspecified: Secondary | ICD-10-CM | POA: Diagnosis not present

## 2021-09-14 DIAGNOSIS — E559 Vitamin D deficiency, unspecified: Secondary | ICD-10-CM

## 2021-09-14 DIAGNOSIS — G4733 Obstructive sleep apnea (adult) (pediatric): Secondary | ICD-10-CM

## 2021-09-14 NOTE — Patient Instructions (Signed)
Welcome to Medicare in 3 months, call  if you need me sooner ? ?TSH, and vit D today ? ?It is important that you exercise regularly at least 30 minutes 5 times a week. If you develop chest pain, have severe difficulty breathing, or feel very tired, stop exercising immediately and seek medical attention  ? ? ?Think about what you will eat, plan ahead. ?Choose " clean, green, fresh or frozen" over canned, processed or packaged foods which are more sugary, salty and fatty. ?70 to 75% of food eaten should be vegetables and fruit. ?Three meals at set times with snacks allowed between meals, but they must be fruit or vegetables. ?Aim to eat over a 12 hour period , example 7 am to 7 pm, and STOP after  your last meal of the day. ?Drink water,generally about 64 ounces per day, no other drink is as healthy. Fruit juice is best enjoyed in a healthy way, by EATING the fruit. ? ? ?Thanks for choosing [facility], we consider it a privelige to serve you. ? ?

## 2021-09-15 LAB — TSH: TSH: 2 u[IU]/mL (ref 0.450–4.500)

## 2021-09-15 LAB — VITAMIN D 25 HYDROXY (VIT D DEFICIENCY, FRACTURES): Vit D, 25-Hydroxy: 35.5 ng/mL (ref 30.0–100.0)

## 2021-09-18 DIAGNOSIS — L989 Disorder of the skin and subcutaneous tissue, unspecified: Secondary | ICD-10-CM | POA: Insufficient documentation

## 2021-09-18 NOTE — Assessment & Plan Note (Signed)
Controlled, no change in medication ?DASH diet and commitment to daily physical activity for a minimum of 30 minutes discussed and encouraged, as a part of hypertension management. ?The importance of attaining a healthy weight is also discussed. ? ? ?  09/14/2021  ?  4:10 PM 09/14/2021  ?  3:52 PM 03/14/2021  ?  9:25 AM 03/14/2021  ?  9:23 AM 02/11/2021  ?  9:51 AM 08/05/2020  ?  3:21 PM 06/21/2020  ?  1:54 PM  ?BP/Weight  ?Systolic BP 401 027 253 664 137 128   ?Diastolic BP 76 76 84 80 87 79   ?Wt. (Lbs)  189.12  184 183.12 192.8 182  ?BMI  33.5 kg/m2  32.59 kg/m2 32.44 kg/m2 34.15 kg/m2 32.24 kg/m2  ? ? ? ? ?

## 2021-09-18 NOTE — Assessment & Plan Note (Signed)
Hyperlipidemia:Low fat diet discussed and encouraged. ? ? ?Lipid Panel  ?Lab Results  ?Component Value Date  ? CHOL 161 07/29/2021  ? HDL 47 07/29/2021  ? [place] 97 07/29/2021  ? TRIG 90 07/29/2021  ? CHOLHDL 3.4 07/29/2021  ? ? ?Controlled, no change in medication ? ? ?

## 2021-09-18 NOTE — Assessment & Plan Note (Signed)
C/o enlarging lesion on left elbow, non bleeding, not itching, request derm eval ?

## 2021-09-18 NOTE — Assessment & Plan Note (Signed)
?  Patient re-educated about  the importance of commitment to a  minimum of 150 minutes of exercise per week as able. ? ?The importance of healthy food choices with portion control discussed, as well as eating regularly and within a 12 hour window most days. ?The need to choose "clean , green" food 50 to 75% of the time is discussed, as well as to make water the primary drink and set a goal of 64 ounces water daily. ? ?  ? ?  09/14/2021  ?  3:52 PM 03/14/2021  ?  9:23 AM 02/11/2021  ?  9:51 AM  ?Weight /BMI  ?Weight 189 lb 1.9 oz 184 lb 183 lb 1.9 oz  ?Height '5\' 3"'$  (1.6 m) '5\' 3"'$  (1.6 m) '5\' 3"'$  (1.6 m)  ?BMI 33.5 kg/m2 32.59 kg/m2 32.44 kg/m2  ? ? ? ?

## 2021-09-18 NOTE — Assessment & Plan Note (Signed)
Importance of compliance is discussed ?

## 2021-09-18 NOTE — Assessment & Plan Note (Signed)
Controlled, no change in medication  

## 2021-09-18 NOTE — Progress Notes (Signed)
? ?ARREN DUNAVAN     MRN: [id number]      DOB: 1956-02-06 ? ? ?HPI ?Ms. Kristin Hall is here for follow up and re-evaluation of chronic medical conditions, medication management and review of any available recent lab and radiology data.  ?Preventive health is updated, specifically  Cancer screening and Immunization.   ?Questions or concerns regarding consultations or procedures which the PT has had in the interim are  addressed. ?The PT denies any adverse reactions to current medications since c/o spot on her left elbow which is increasing in size over past several months ? ?ROS ?Denies recent fever or chills. ?Denies sinus pressure, nasal congestion, ear pain or sore throat. ?Denies chest congestion, productive cough or wheezing. ?Denies chest pains, palpitations and leg swelling ?Denies abdominal pain, nausea, vomiting,diarrhea or constipation.   ?Denies dysuria, frequency, hesitancy or incontinence. ?Denies joint pain, swelling and limitation in mobility. ?Denies headaches, seizures, numbness, or tingling. ?Denies depression, anxiety or insomnia. ? ?PE ? ?BP 120/76   Pulse 83   Ht 5\' 3"  (1.6 m)   Wt 189 lb 1.9 oz (85.8 kg)   SpO2 93%   BMI 33.50 kg/m?  ? ?Patient alert and oriented and in no cardiopulmonary distress. ? ?HEENT: No facial asymmetry, EOMI,     Neck supple . ? ?Chest: Clear to auscultation bilaterally. ? ?CVS: S1, S2 no murmurs, no S3.Regular rate. ? ?ABD: Soft non tender.  ? ?Ext: No edema ? ?MS: Adequate ROM spine, shoulders, hips and knees. ? ?Skin: Intact, no ulcerations or rash noted. ? ?Psych: Good eye contact, normal affect. Memory intact not anxious or depressed appearing. ? ?CNS: CN 2-12 intact, power,  normal throughout.no focal deficits noted. ? ? ?Assessment & Plan ? ?Essential hypertension ?Controlled, no change in medication ?DASH diet and commitment to daily physical activity for a minimum of 30 minutes discussed and encouraged, as a part of hypertension management. ?The importance of  attaining a healthy weight is also discussed. ? ? ?  09/14/2021  ?  4:10 PM 09/14/2021  ?  3:52 PM 03/14/2021  ?  9:25 AM 03/14/2021  ?  9:23 AM 02/11/2021  ?  9:51 AM 08/05/2020  ?  3:21 PM 06/21/2020  ?  1:54 PM  ?BP/Weight  ?Systolic BP 120 137 134 130 137 128   ?Diastolic BP 76 76 84 80 87 79   ?Wt. (Lbs)  189.12  184 183.12 192.8 182  ?BMI  33.5 kg/m2  32.59 kg/m2 32.44 kg/m2 34.15 kg/m2 32.24 kg/m2  ? ? ? ? ? ?GERD ?Controlled, no change in medication ? ? ?Hyperlipemia ?Hyperlipidemia:Low fat diet discussed and encouraged. ? ? ?Lipid Panel  ?Lab Results  ?Component Value Date  ? CHOL 161 07/29/2021  ? HDL 47 07/29/2021  ? LDLCALC 97 07/29/2021  ? TRIG 90 07/29/2021  ? CHOLHDL 3.4 07/29/2021  ? ? ?Controlled, no change in medication ? ? ? ?OSA on CPAP ?Importance of compliance is discussed ? ?Skin lesion ?C/o enlarging lesion on left elbow, non bleeding, not itching, request derm eval ? ?Obesity (BMI 30.0-34.9) ? ?Patient re-educated about  the importance of commitment to a  minimum of 150 minutes of exercise per week as able. ? ?The importance of healthy food choices with portion control discussed, as well as eating regularly and within a 12 hour window most days. ?The need to choose "clean , green" food 50 to 75% of the time is discussed, as well as to make water the primary drink and set  a goal of 64 ounces water daily. ? ?  ? ?  09/14/2021  ?  3:52 PM 03/14/2021  ?  9:23 AM 02/11/2021  ?  9:51 AM  ?Weight /BMI  ?Weight 189 lb 1.9 oz 184 lb 183 lb 1.9 oz  ?Height 5\' 3"  (1.6 m) 5\' 3"  (1.6 m) 5\' 3"  (1.6 m)  ?BMI 33.5 kg/m2 32.59 kg/m2 32.44 kg/m2  ? ? ? ? ?

## 2021-09-28 DIAGNOSIS — L858 Other specified epidermal thickening: Secondary | ICD-10-CM | POA: Diagnosis not present

## 2021-11-07 ENCOUNTER — Telehealth: Payer: Self-pay | Admitting: Family Medicine

## 2021-11-07 ENCOUNTER — Other Ambulatory Visit: Payer: Self-pay

## 2021-11-07 ENCOUNTER — Other Ambulatory Visit (HOSPITAL_COMMUNITY): Payer: Self-pay

## 2021-11-07 DIAGNOSIS — K219 Gastro-esophageal reflux disease without esophagitis: Secondary | ICD-10-CM

## 2021-11-07 MED ORDER — PANTOPRAZOLE SODIUM 40 MG PO TBEC: 40.0000 mg | DELAYED_RELEASE_TABLET | Freq: Every day | ORAL | 1 refills | Status: DC

## 2021-11-07 MED ORDER — DILTIAZEM HCL ER BEADS 300 MG PO CP24: 300.0000 mg | ORAL_CAPSULE | Freq: Every day | ORAL | 1 refills | Status: DC

## 2021-11-07 MED ORDER — OLMESARTAN MEDOXOMIL-HCTZ 40-25 MG PO TABS: 1.0000 | ORAL_TABLET | Freq: Every day | ORAL | 1 refills | Status: DC

## 2021-11-07 MED ORDER — EZETIMIBE-SIMVASTATIN 10-10 MG PO TABS: 1.0000 | ORAL_TABLET | Freq: Every evening | ORAL | 1 refills | Status: DC

## 2021-11-07 MED ORDER — AMLODIPINE BESYLATE 2.5 MG PO TABS: 2.5000 mg | ORAL_TABLET | Freq: Every day | ORAL | 1 refills | Status: DC

## 2021-11-07 MED ORDER — POTASSIUM CHLORIDE CRYS ER 20 MEQ PO TBCR: 20.0000 meq | EXTENDED_RELEASE_TABLET | Freq: Two times a day (BID) | ORAL | 1 refills | Status: DC

## 2021-11-08 ENCOUNTER — Telehealth: Payer: Self-pay

## 2021-11-08 NOTE — Telephone Encounter (Signed)
Patient came by the office and needs all her medications switch tover to [facility] on [address] from [facility]

## 2021-12-20 ENCOUNTER — Encounter: Payer: Self-pay | Admitting: Family Medicine

## 2021-12-20 ENCOUNTER — Telehealth: Payer: Self-pay | Admitting: Family Medicine

## 2021-12-20 ENCOUNTER — Ambulatory Visit (INDEPENDENT_AMBULATORY_CARE_PROVIDER_SITE_OTHER): Payer: Medicare Other | Admitting: Family Medicine

## 2021-12-20 VITALS — BP 122/78 | HR 82 | Resp 16 | Ht 63.0 in | Wt 189.8 lb

## 2021-12-20 DIAGNOSIS — Z0000 Encounter for general adult medical examination without abnormal findings: Secondary | ICD-10-CM

## 2021-12-20 DIAGNOSIS — Z23 Encounter for immunization: Secondary | ICD-10-CM | POA: Diagnosis not present

## 2021-12-20 DIAGNOSIS — I10 Essential (primary) hypertension: Secondary | ICD-10-CM

## 2021-12-20 DIAGNOSIS — R748 Abnormal levels of other serum enzymes: Secondary | ICD-10-CM

## 2021-12-20 LAB — POCT URINALYSIS DIP (CLINITEK)
Bilirubin, UA: NEGATIVE
Glucose, UA: NEGATIVE mg/dL
Ketones, POC UA: NEGATIVE mg/dL
Leukocytes, UA: NEGATIVE
Nitrite, UA: NEGATIVE
POC PROTEIN,UA: NEGATIVE
Spec Grav, UA: 1.02 (ref 1.010–1.025)
Urobilinogen, UA: 0.2 E.U./dL
pH, UA: 7 (ref 5.0–8.0)

## 2021-12-20 NOTE — Patient Instructions (Addendum)
Annual exam as before, call if you need me sooner  Pneumonia 19 today  CCUA today due to hypertension diagnosis  It is important that you exercise regularly at least 30 minutes 5 times a week. If you develop chest pain, have severe difficulty breathing, or feel very tired, stop exercising immediately and seek medical attention   Work on living will.discuss with your son.  Please schedule  mammogram 9/17 or after also dexa , at checkout  No med changes at this time  Fasting alk phos and GGT this week please, I am also referring you to Dr Abbey Chatters for follow up  Thanks for choosing [facility], we consider it a privelige to serve you.

## 2021-12-20 NOTE — Telephone Encounter (Signed)
Can you please put in the order for the mammogram and DEXA so I can schedule her?

## 2021-12-20 NOTE — Progress Notes (Signed)
Preventive Screening-Counseling & Management   Patient present here today for a welcome to medicare Current Problems (verified)   Medications Prior to Visit Allergies (verified)   PAST HISTORY  Family History- Widowed since 2014, 1 son, 2 grandchildren   Social History- recently retired. Worked at [employer] for 12 years. Never been a smoker, no alcohol or drug use    Risk Factors  Current exercise habits: no current exercise   Dietary issues discussed: no special diet, limits carbs and sweets, doesn't eat much fried fatty foods   Cardiac risk factors: HTN, heart valve disorder, hyperlipidemia  Depression Screen  (Note: if answer to either of the following is "Yes", a more complete depression screening is indicated)   Over the past two weeks, have you felt down, depressed or hopeless? No  Over the past two weeks, have you felt little interest or pleasure in doing things? No  Have you lost interest or pleasure in daily life? No  Do you often feel hopeless? No  Do you cry easily over simple problems? No   Activities of Daily Living  In your present state of health, do you have any difficulty performing the following activities?  Driving?: No Managing money?: No Feeding yourself?:No Getting from bed to chair?:No Climbing a flight of stairs?:No Preparing food and eating?:No Bathing or showering?:No Getting dressed?:No Getting to the toilet?:No Using the toilet?:No Moving around from place to place?: No  Fall Risk Assessment In the past year have you fallen or had a near fall?:No Are you currently taking any medications that make you dizziness?:No   Hearing Difficulties: No Do you often ask people to speak up or repeat themselves?:No Do you experience ringing or noises in your ears?:No Do you have difficulty understanding soft or whispered voices?:No  Cognitive Testing  Alert? Yes Normal Appearance?Yes  Oriented to person? Yes Place? Yes  Time? Yes  Displays  appropriate judgment?Yes  Can read the correct time from a watch face? yes Are you having problems remembering things?No  Advanced Directives have been discussed with the patient?Yes, form given    List the Names of Other Physician/Practitioners you currently use:  Dr Jorja Loa (opth) Dr Merlene Laughter (neurology) Dr Abbey Chatters (GI)    Indicate any recent Medical Services you may have received from other than [facility] providers in the past year (date may be approximate).     Medicare Attestation  I have personally reviewed:  The patient's medical and social history  Their use of alcohol, tobacco or illicit drugs  Their current medications and supplements  The patient's functional ability including ADLs,fall risks, home safety risks, cognitive, and hearing and visual impairment  Diet and physical activities  Evidence for depression or mood disorders  The patient's weight, height, BMI, and visual acuity have been recorded in the chart. I have made referrals, counseling, and provided education to the patient based on review of the above and I have provided the patient with a written personalized care plan for preventive services.    Physical Exam BP 122/78   Pulse 82   Resp 16   Ht '5\' 3"'$  (1.6 m)   Wt 189 lb 12.8 oz (86.1 kg)   SpO2 94%   BMI 33.62 kg/m     Assessment & Plan:  Welcome to Medicare preventive visit Welcome to Medicare  as documented. Immunization and cancer screening needs are specifically addressed at this visit. Advanced directives are discussed and information provided  Elevated alkaline phosphatase level Repeat lab and refer to GI  for follow up

## 2021-12-21 ENCOUNTER — Other Ambulatory Visit: Payer: Self-pay | Admitting: Family Medicine

## 2021-12-21 DIAGNOSIS — Z1231 Encounter for screening mammogram for malignant neoplasm of breast: Secondary | ICD-10-CM

## 2021-12-21 DIAGNOSIS — Z780 Asymptomatic menopausal state: Secondary | ICD-10-CM

## 2021-12-21 NOTE — Telephone Encounter (Signed)
Order in.

## 2021-12-27 DIAGNOSIS — R748 Abnormal levels of other serum enzymes: Secondary | ICD-10-CM | POA: Diagnosis not present

## 2021-12-28 ENCOUNTER — Telehealth: Payer: Self-pay | Admitting: Family Medicine

## 2021-12-28 ENCOUNTER — Other Ambulatory Visit: Payer: Self-pay | Admitting: Family Medicine

## 2021-12-28 ENCOUNTER — Encounter: Payer: Self-pay | Admitting: Family Medicine

## 2021-12-28 DIAGNOSIS — R319 Hematuria, unspecified: Secondary | ICD-10-CM

## 2021-12-28 DIAGNOSIS — Z0000 Encounter for general adult medical examination without abnormal findings: Secondary | ICD-10-CM | POA: Insufficient documentation

## 2021-12-28 LAB — ALKALINE PHOSPHATASE: Alkaline Phosphatase: 154 IU/L — ABNORMAL HIGH (ref 44–121)

## 2021-12-28 LAB — GAMMA GT: GGT: 24 IU/L (ref 0–60)

## 2021-12-28 NOTE — Assessment & Plan Note (Signed)
Repeat lab and refer to GI for follow up

## 2021-12-28 NOTE — Assessment & Plan Note (Signed)
Welcome to [employer] [facility]  as documented. Immunization and cancer screening needs are specifically addressed at this visit. Advanced directives are discussed and information provided

## 2021-12-28 NOTE — Telephone Encounter (Signed)
Will come by tomorrow to leave urine specimen

## 2021-12-28 NOTE — Telephone Encounter (Signed)
Pt does need urine c/s, if this is normal , I am referring her to urology, for hematuria, thanks

## 2021-12-29 ENCOUNTER — Encounter: Payer: Self-pay | Admitting: *Deleted

## 2021-12-29 DIAGNOSIS — R319 Hematuria, unspecified: Secondary | ICD-10-CM | POA: Diagnosis not present

## 2021-12-31 LAB — URINE CULTURE: Organism ID, Bacteria: NO GROWTH

## 2022-01-18 ENCOUNTER — Encounter: Payer: Self-pay | Admitting: Internal Medicine

## 2022-01-18 ENCOUNTER — Ambulatory Visit (INDEPENDENT_AMBULATORY_CARE_PROVIDER_SITE_OTHER): Payer: Medicare Other | Admitting: Internal Medicine

## 2022-01-18 VITALS — BP 141/80 | HR 112 | Temp 98.4°F | Ht 63.0 in | Wt 191.6 lb

## 2022-01-18 DIAGNOSIS — K219 Gastro-esophageal reflux disease without esophagitis: Secondary | ICD-10-CM | POA: Diagnosis not present

## 2022-01-18 DIAGNOSIS — D123 Benign neoplasm of transverse colon: Secondary | ICD-10-CM | POA: Diagnosis not present

## 2022-01-18 DIAGNOSIS — R748 Abnormal levels of other serum enzymes: Secondary | ICD-10-CM

## 2022-01-18 NOTE — Patient Instructions (Signed)
Your most recent alkaline phosphatase level was still elevated though actually improved compared to prior.  We will continue to monitor this.  Continue on pantoprazole for your chronic acid reflux.  We will plan on colonoscopy 2025.  Follow-up in 1 year.  We will likely perform ultrasound of your liver at that time.  It was very nice seeing you again today.  Dr. Eulas Post

## 2022-01-18 NOTE — Progress Notes (Signed)
Referring Provider: Fayrene Helper, MD Primary Care Physician:  Fayrene Helper, MD Primary GI:  Dr. Abbey Chatters  Chief Complaint  Patient presents with   Follow-up    Patient here today for a follow on her elevated Alkaline phosphatase level and Gerd. She takes protonix 40 mg once per day this does control her gerd symptoms. Patient denies any other current gi issues.    HPI:   Kristin Hall is a very pleasant 67 y.o. female who presents to clinic for follow-up visit.  She has a history of chronic alkaline phosphatase elevation.This has been mild in the past.  She has been worked up extensively including CT scan, MRI/MRCP which have been WNL.  Last ultrasound 07/16/2020 showed fatty liver otherwise unremarkable.  Most recent alkaline phosphatase level of 154, GGT WNL.  She has been AMA negative in the past.  Patient has no complaints for me today.  Denies any itching.  Does have chronic reflux for which is well controlled on daily pantoprazole.  Does not need refills today.  Last colonoscopy 2020 with tubular adenoma removed.  Recall 2025  Past Medical History:  Diagnosis Date   Elevated alkaline phosphatase level    Headache    Hyperlipidemia    Hypertension    OSA on CPAP    Thrombocytosis 07/23/2015    Past Surgical History:  Procedure Laterality Date   ABDOMINAL HYSTERECTOMY     partial    COLONOSCOPY  10/04/2006   SLF: Normal retroflexed view of the rectum/Normal colon without evidence of polyps, masses, inflammatory changes, diverticula or arteriovenous malformations   COLONOSCOPY N/A 12/07/2016   Dr. Oneida Alar: 15 mm polyp in cecum, s/p APC and clips. One 30 mm polyp in sigmoid, s/p clip, injection, tattooed. Path with one adenoma and one hamartomatous polyp. Surveillance 2021.    COLONOSCOPY N/A 09/08/2019   Procedure: COLONOSCOPY;  Surgeon: Danie Binder, MD;  Location: [facility];  Service: Endoscopy;  Laterality: N/A;  8:30AM   POLYPECTOMY  12/07/2016    Procedure: POLYPECTOMY;  Surgeon: Danie Binder, MD;  Location: [facility];  Service: Endoscopy;;  cecal;   POLYPECTOMY  09/08/2019   Procedure: POLYPECTOMY;  Surgeon: Danie Binder, MD;  Location: [facility];  Service: Endoscopy;;    Current Outpatient Medications  Medication Sig Dispense Refill   amLODipine (NORVASC) 2.5 MG tablet Take 1 tablet (2.5 mg total) by mouth daily. 90 tablet 1   diltiazem (TIAZAC) 300 MG 24 hr capsule Take 1 capsule (300 mg total) by mouth daily. 90 capsule 1   ezetimibe-simvastatin (VYTORIN) 10-10 MG tablet Take 1 tablet by mouth at bedtime for cholesterol 90 tablet 1   Multiple Vitamins-Minerals (ALIVE WOMENS 50+) TABS Take 1 tablet by mouth daily.     olmesartan-hydrochlorothiazide (BENICAR HCT) 40-25 MG tablet Take 1 tablet by mouth daily. 90 tablet 1   pantoprazole (PROTONIX) 40 MG tablet Take 1 tablet (40 mg total) by mouth daily. 90 tablet 1   potassium chloride SA (KLOR-CON M) 20 MEQ tablet Take 1 tablet (20 mEq total) by mouth 2 (two) times daily. 180 tablet 1   No current facility-administered medications for this visit.    Allergies as of 01/18/2022 - Review Complete 01/18/2022  Allergen Reaction Noted   Aspirin Other (See Comments) 09/22/2013   Metronidazole Hives and Itching 11/12/2007   Valsartan Cough 04/19/2015    Family History  Problem Relation Age of Onset   Cancer Mother  Breast   Dementia Mother    Cancer Father        Lung Cancer, deceased at 19   Colon cancer Neg Hx    Liver disease Neg Hx     Social History   Socioeconomic History   Marital status: Widowed    Spouse name: Not on file   Number of children: Not on file   Years of education: Not on file   Highest education level: Not on file  Occupational History   Not on file  Tobacco Use   Smoking status: Never   Smokeless tobacco: Never  Vaping Use   Vaping Use: Never used  Substance and Sexual Activity   Alcohol use: No   Drug use: No   Sexual  activity: Yes  Other Topics Concern   Not on file  Social History Narrative   Not on file   Social Determinants of Health   Financial Resource Strain: Not on file  Food Insecurity: Not on file  Transportation Needs: Not on file  Physical Activity: Not on file  Stress: Not on file  Social Connections: Not on file    Subjective: Review of Systems  Constitutional:  Negative for chills and fever.  HENT:  Negative for congestion and hearing loss.   Eyes:  Negative for blurred vision and double vision.  Respiratory:  Negative for cough and shortness of breath.   Cardiovascular:  Negative for chest pain and palpitations.  Gastrointestinal:  Negative for abdominal pain, blood in stool, constipation, diarrhea, heartburn, melena and vomiting.  Genitourinary:  Negative for dysuria and urgency.  Musculoskeletal:  Negative for joint pain and myalgias.  Skin:  Negative for itching and rash.  Neurological:  Negative for dizziness and headaches.  Psychiatric/Behavioral:  Negative for depression. The patient is not nervous/anxious.      Objective: BP (!) 141/80 (BP Location: Left Arm, Patient Position: Sitting, Cuff Size: Large)   Pulse (!) 112   Temp 98.4 F (36.9 C) (Oral)   Ht '5\' 3"'$  (1.6 m)   Wt 191 lb 9.6 oz (86.9 kg)   BMI 33.94 kg/m  Physical Exam Constitutional:      Appearance: Normal appearance.  HENT:     Head: Normocephalic and atraumatic.  Eyes:     Extraocular Movements: Extraocular movements intact.     Conjunctiva/sclera: Conjunctivae normal.  Cardiovascular:     Rate and Rhythm: Normal rate and regular rhythm.  Pulmonary:     Effort: Pulmonary effort is normal.     Breath sounds: Normal breath sounds.  Abdominal:     General: Bowel sounds are normal.     Palpations: Abdomen is soft.  Musculoskeletal:        General: No swelling. Normal range of motion.     Cervical back: Normal range of motion and neck supple.  Skin:    General: Skin is warm and dry.      Coloration: Skin is not jaundiced.  Neurological:     General: No focal deficit present.     Mental Status: She is alert and oriented to person, place, and time.  Psychiatric:        Mood and Affect: Mood normal.        Behavior: Behavior normal.      Assessment: *Elevated alkaline phosphatase *Chronic GERD-well-controlled on pantoprazole daily *History of adenomatous colon polyp  Plan: Discussed alkaline phosphatase in depth with patient today.  Currently at baseline..  She has had extensive work-up including MRI/MRCP, CT, as well  as recent ultrasound which showed fatty liver disease.  Mitochondrial antibody negative.    Discussed liver biopsy in the past, patient would like to hold off.  We will update ultrasound every 2 years.  GERD well-controlled on daily pantoprazole.  We will continue.  Colonoscopy recall 2025 for history of adenomatous colon polyps.  Follow-up in 1 year or sooner if needed.  01/18/2022 11:38 AM   Disclaimer: This note was dictated with voice recognition software. Similar sounding words can inadvertently be transcribed and may not be corrected upon review.

## 2022-02-01 ENCOUNTER — Ambulatory Visit (HOSPITAL_COMMUNITY)
Admission: RE | Admit: 2022-02-01 | Discharge: 2022-02-01 | Disposition: A | Discharge status: Home / Self Care | Payer: Medicare Other | Source: Ambulatory Visit | Attending: Family Medicine | Admitting: Family Medicine

## 2022-02-01 DIAGNOSIS — Z1231 Encounter for screening mammogram for malignant neoplasm of breast: Secondary | ICD-10-CM | POA: Insufficient documentation

## 2022-02-01 DIAGNOSIS — Z780 Asymptomatic menopausal state: Secondary | ICD-10-CM | POA: Insufficient documentation

## 2022-02-01 DIAGNOSIS — M85851 Other specified disorders of bone density and structure, right thigh: Secondary | ICD-10-CM | POA: Diagnosis not present

## 2022-02-01 DIAGNOSIS — Z13820 Encounter for screening for osteoporosis: Secondary | ICD-10-CM | POA: Diagnosis not present

## 2022-02-16 ENCOUNTER — Ambulatory Visit (INDEPENDENT_AMBULATORY_CARE_PROVIDER_SITE_OTHER): Payer: Medicare Other | Admitting: Family Medicine

## 2022-02-16 ENCOUNTER — Other Ambulatory Visit: Payer: Self-pay

## 2022-02-16 ENCOUNTER — Encounter: Payer: Self-pay | Admitting: Family Medicine

## 2022-02-16 ENCOUNTER — Other Ambulatory Visit (HOSPITAL_COMMUNITY)
Admission: RE | Admit: 2022-02-16 | Discharge: 2022-02-16 | Disposition: A | Discharge status: Home / Self Care | Payer: Medicare Other | Source: Ambulatory Visit | Attending: Family Medicine | Admitting: Family Medicine

## 2022-02-16 VITALS — BP 129/78 | HR 92 | Ht 63.0 in | Wt 188.0 lb

## 2022-02-16 DIAGNOSIS — Z124 Encounter for screening for malignant neoplasm of cervix: Secondary | ICD-10-CM | POA: Diagnosis not present

## 2022-02-16 DIAGNOSIS — Z0000 Encounter for general adult medical examination without abnormal findings: Secondary | ICD-10-CM

## 2022-02-16 DIAGNOSIS — Z0001 Encounter for general adult medical examination with abnormal findings: Secondary | ICD-10-CM | POA: Diagnosis not present

## 2022-02-16 DIAGNOSIS — Z1151 Encounter for screening for human papillomavirus (HPV): Secondary | ICD-10-CM | POA: Insufficient documentation

## 2022-02-16 DIAGNOSIS — Z01419 Encounter for gynecological examination (general) (routine) without abnormal findings: Secondary | ICD-10-CM | POA: Insufficient documentation

## 2022-02-16 DIAGNOSIS — K219 Gastro-esophageal reflux disease without esophagitis: Secondary | ICD-10-CM

## 2022-02-16 DIAGNOSIS — Z1272 Encounter for screening for malignant neoplasm of vagina: Secondary | ICD-10-CM

## 2022-02-16 MED ORDER — AMLODIPINE BESYLATE 2.5 MG PO TABS: 2.5000 mg | ORAL_TABLET | Freq: Every day | ORAL | 3 refills | Status: DC

## 2022-02-16 MED ORDER — POTASSIUM CHLORIDE CRYS ER 20 MEQ PO TBCR: 20.0000 meq | EXTENDED_RELEASE_TABLET | Freq: Two times a day (BID) | ORAL | 3 refills | Status: DC

## 2022-02-16 MED ORDER — DILTIAZEM HCL ER BEADS 300 MG PO CP24: 300.0000 mg | ORAL_CAPSULE | Freq: Every day | ORAL | 3 refills | Status: DC

## 2022-02-16 MED ORDER — EZETIMIBE-SIMVASTATIN 10-10 MG PO TABS: 1.0000 | ORAL_TABLET | Freq: Every evening | ORAL | 3 refills | Status: DC

## 2022-02-16 MED ORDER — OLMESARTAN MEDOXOMIL-HCTZ 40-25 MG PO TABS: 1.0000 | ORAL_TABLET | Freq: Every day | ORAL | 3 refills | Status: DC

## 2022-02-16 MED ORDER — PANTOPRAZOLE SODIUM 40 MG PO TBEC: 40.0000 mg | DELAYED_RELEASE_TABLET | Freq: Every day | ORAL | 3 refills | Status: DC

## 2022-02-16 NOTE — Patient Instructions (Addendum)
F/u in 6 months, call if you need me sooner  CONGRATS on excellent preventive health management, keep up the great work!  No med changes   Nurse please renew 90 day suppies of all meds x 9 months, thanks  It is important that you exercise regularly at least 30 minutes 5 times a week. If you develop chest pain, have severe difficulty breathing, or feel very tired, stop exercising immediately and seek medical attention  Thanks for choosing [facility], we consider it a privelige to serve you.

## 2022-02-16 NOTE — Progress Notes (Signed)
Kristin Hall     MRN: [id number]      DOB: 18-Oct-1955  HPI in for annual exam vit d dosing discussed 2000 iU per dayRecent labs,  are reviewed. Immunization is reviewed , and  is up[ to date PE: BP 129/78 (BP Location: Right Arm, Patient Position: Sitting)   Pulse 92   Ht 5\' 3"  (1.6 m)   Wt 188 lb (85.3 kg)   SpO2 95%   BMI 33.30 kg/m   Pleasant  female, alert and oriented x 3, in no cardio-pulmonary distress. Afebrile. HEENT No facial trauma or asymetry. Sinuses non tender.  Extra occullar muscles intact.. External ears normal, . Neck: supple, no adenopathy,JVD or thyromegaly.No bruits.  Chest: Clear to ascultation bilaterally.No crackles or wheezes. Non tender to palpation  Breast: No asymetry,no masses or lumps. No tenderness. No nipple discharge or inversion. No axillary or supraclavicular adenopathy  Cardiovascular system; Heart sounds normal,  S1 and  S2 ,no S3.  No murmur, or thrill. Apical beat not displaced Peripheral pulses normal.  Abdomen: Soft, non tender, no organomegaly or masses. No bruits. Bowel sounds normal. No guarding, tenderness or rebound.   GU: External genitalia normal female genitalia , normal female distribution of hair. No lesions. Urethral meatus normal in size, no  Prolapse, no lesions visibly  Present. Bladder non tender. Vagina pink and moist , with no visible lesions , discharge present . Adequate pelvic support no  cystocele or rectocele noted  Uterus absent, no adnexal masses, no  adnexal tenderness.   Musculoskeletal exam: Full ROM of spine, hips , shoulders and knees. No deformity ,swelling or crepitus noted. No muscle wasting or atrophy.   Neurologic: Cranial nerves 2 to 12 intact. Power, tone ,sensation and reflexes normal throughout. No disturbance in gait. No tremor.  Skin: Intact, no ulceration, erythema , scaling or rash noted. Pigmentation normal throughout  Psych; Normal mood and affect. Judgement  and concentration normal   Assessment & Plan:  Annual physical exam Annual exam as documented. Immunization and cancer screening needs are specifically addressed at this visit.

## 2022-02-16 NOTE — Assessment & Plan Note (Signed)
Annual exam as documented. . Immunization and cancer screening needs are specifically addressed at this visit.  

## 2022-02-20 LAB — CYTOLOGY - PAP
Comment: NEGATIVE
Diagnosis: NEGATIVE
High risk HPV: NEGATIVE

## 2022-05-06 ENCOUNTER — Other Ambulatory Visit: Payer: Self-pay | Admitting: Family Medicine

## 2022-05-18 ENCOUNTER — Other Ambulatory Visit: Payer: Self-pay | Admitting: Family Medicine

## 2022-07-03 ENCOUNTER — Telehealth: Payer: Medicare Other | Admitting: Family Medicine

## 2022-07-03 DIAGNOSIS — M5442 Lumbago with sciatica, left side: Secondary | ICD-10-CM

## 2022-07-03 NOTE — Progress Notes (Signed)
[place]  Back pain with numbness and weakness. Advised ED give sensation changes, for in person eval

## 2022-07-07 ENCOUNTER — Ambulatory Visit (INDEPENDENT_AMBULATORY_CARE_PROVIDER_SITE_OTHER): Payer: Medicare Other | Admitting: Family Medicine

## 2022-07-07 ENCOUNTER — Encounter: Payer: Self-pay | Admitting: Family Medicine

## 2022-07-07 VITALS — BP 141/80 | HR 92 | Ht 63.0 in | Wt 188.0 lb

## 2022-07-07 DIAGNOSIS — R350 Frequency of micturition: Secondary | ICD-10-CM

## 2022-07-07 DIAGNOSIS — M79602 Pain in left arm: Secondary | ICD-10-CM | POA: Diagnosis not present

## 2022-07-07 DIAGNOSIS — M5442 Lumbago with sciatica, left side: Secondary | ICD-10-CM | POA: Diagnosis not present

## 2022-07-07 DIAGNOSIS — R202 Paresthesia of skin: Secondary | ICD-10-CM | POA: Diagnosis not present

## 2022-07-07 DIAGNOSIS — R200 Anesthesia of skin: Secondary | ICD-10-CM

## 2022-07-07 DIAGNOSIS — R3121 Asymptomatic microscopic hematuria: Secondary | ICD-10-CM | POA: Diagnosis not present

## 2022-07-07 DIAGNOSIS — I10 Essential (primary) hypertension: Secondary | ICD-10-CM | POA: Diagnosis not present

## 2022-07-07 LAB — POCT URINALYSIS DIP (CLINITEK)
Bilirubin, UA: NEGATIVE
Glucose, UA: NEGATIVE mg/dL
Ketones, POC UA: NEGATIVE mg/dL
Leukocytes, UA: NEGATIVE
Nitrite, UA: NEGATIVE
POC PROTEIN,UA: NEGATIVE
Spec Grav, UA: 1.02 (ref 1.010–1.025)
Urobilinogen, UA: 0.2 E.U./dL
pH, UA: 7.5 (ref 5.0–8.0)

## 2022-07-07 MED ORDER — PREDNISONE 10 MG (21) PO TBPK: ORAL_TABLET | ORAL | 0 refills | Status: DC

## 2022-07-07 MED ORDER — METHYLPREDNISOLONE ACETATE 80 MG/ML IJ SUSP
80.0000 mg | Freq: Once | INTRAMUSCULAR | Status: AC
  Administered 2022-07-07: 80 mg via INTRAMUSCULAR

## 2022-07-07 MED ORDER — GABAPENTIN 100 MG PO CAPS: ORAL_CAPSULE | ORAL | 0 refills | Status: DC

## 2022-07-07 NOTE — Patient Instructions (Signed)
F/U as before, call if you need me sooner  You are treated for acute back and neck pain , depo medrol 80 mg IM and short course of prednisone as well as bedtime gabapentin are prescribed  CCUA today because of frqyuency, it will be sent for culture if you appear to have an infection, no antibiotics are prescribed  Thanks for choosing [facility], we consider it a privelige to serve you.

## 2022-07-07 NOTE — Progress Notes (Unsigned)
Kristin Hall     MRN: [id number]      DOB: 18-Apr-1956   HPI Kristin Hall is here c/o left back and left buttock, lateral thigh and anterior thighk and tingling in al;l 10 toes and fingers. Had been painting the week of 2 12 through 16/2024 Left upper ext pain  Pain in lower ext 10, toes tingling prevents sleep ,alsop tingling in hands  ROS Denies recent fever or chills. Denies sinus pressure, nasal congestion, ear pain or sore throat. Denies chest congestion, productive cough or wheezing. Denies chest pains, palpitations and leg swelling Denies abdominal pain, nausea, vomiting,diarrhea or constipation.   Denies dysuria, frequency, hesitancy or incontinence. Denies joint pain, swelling and limitation in mobility. Denies headaches, seizures, numbness, or tingling. Denies depression, anxiety or insomnia. Denies skin break down or rash.   PE  BP (!) 141/80 (BP Location: Right Arm, Patient Position: Sitting, Cuff Size: Normal)   Pulse 92   Ht 5\' 3"  (1.6 m)   Wt 188 lb (85.3 kg)   SpO2 94%   BMI 33.30 kg/m   Patient alert and oriented and in no cardiopulmonary distress.  HEENT: No facial asymmetry, EOMI,     Neck supple .  Chest: Clear to auscultation bilaterally.  CVS: S1, S2 no murmurs, no S3.Regular rate.  ABD: Soft non tender.   Ext: No edema  MS: Adequate ROM spine, shoulders, hips and knees.  Skin: Intact, no ulcerations or rash noted.  Psych: Good eye contact, normal affect. Memory intact not anxious or depressed appearing.  CNS: CN 2-12 intact, power,  normal throughout.no focal deficits noted.   Assessment & Plan  No problem-specific Assessment & Plan notes found for this encounter.

## 2022-07-08 ENCOUNTER — Encounter: Payer: Self-pay | Admitting: Family Medicine

## 2022-07-08 DIAGNOSIS — M79602 Pain in left arm: Secondary | ICD-10-CM | POA: Insufficient documentation

## 2022-07-08 DIAGNOSIS — M5442 Lumbago with sciatica, left side: Secondary | ICD-10-CM | POA: Insufficient documentation

## 2022-07-08 DIAGNOSIS — R200 Anesthesia of skin: Secondary | ICD-10-CM | POA: Insufficient documentation

## 2022-07-08 NOTE — Assessment & Plan Note (Signed)
CCUA and c/s if abn

## 2022-07-08 NOTE — Assessment & Plan Note (Signed)
Depo medrol 80 mg IM followed by short course of prednisone and bedtime gabapentin

## 2022-07-08 NOTE — Assessment & Plan Note (Signed)
Eevated at visit, no med change as geenrally controlled DASH diet and commitment to daily physical activity for a minimum of 30 minutes discussed and encouraged, as a part of hypertension management. The importance of attaining a healthy weight is also discussed.     07/07/2022   11:01 AM 02/16/2022    9:01 AM 01/18/2022   11:15 AM 12/20/2021    1:07 PM 09/14/2021    4:10 PM 09/14/2021    3:52 PM 03/14/2021    9:25 AM  BP/Weight  Systolic BP Q000111Q Q000111Q Q000111Q [id number] 0000000 Q000111Q  Diastolic BP 80 78 80 78 76 76 84  Wt. (Lbs) 188 188 191.6 189.8  189.12   BMI 33.3 kg/m2 33.3 kg/m2 33.94 kg/m2 33.62 kg/m2  33.5 kg/m2

## 2022-07-08 NOTE — Assessment & Plan Note (Signed)
Acute, gabapentin short term, triggered by painting walls x 1 week

## 2022-07-08 NOTE — Assessment & Plan Note (Signed)
Acute onset following painting of walls , short sharp course of steroids and bedtime gabapentin

## 2022-07-10 NOTE — Progress Notes (Signed)
UA sent for culture  

## 2022-07-11 LAB — URINE CULTURE: Organism ID, Bacteria: NO GROWTH

## 2022-08-18 ENCOUNTER — Encounter: Payer: Self-pay | Admitting: Family Medicine

## 2022-08-18 ENCOUNTER — Ambulatory Visit (INDEPENDENT_AMBULATORY_CARE_PROVIDER_SITE_OTHER): Payer: Medicare Other | Admitting: Family Medicine

## 2022-08-18 VITALS — BP 130/84 | HR 96 | Ht 63.0 in | Wt 187.1 lb

## 2022-08-18 DIAGNOSIS — E559 Vitamin D deficiency, unspecified: Secondary | ICD-10-CM

## 2022-08-18 DIAGNOSIS — E669 Obesity, unspecified: Secondary | ICD-10-CM

## 2022-08-18 DIAGNOSIS — I10 Essential (primary) hypertension: Secondary | ICD-10-CM | POA: Diagnosis not present

## 2022-08-18 DIAGNOSIS — R3121 Asymptomatic microscopic hematuria: Secondary | ICD-10-CM | POA: Diagnosis not present

## 2022-08-18 DIAGNOSIS — E785 Hyperlipidemia, unspecified: Secondary | ICD-10-CM | POA: Diagnosis not present

## 2022-08-18 DIAGNOSIS — Z1231 Encounter for screening mammogram for malignant neoplasm of breast: Secondary | ICD-10-CM

## 2022-08-18 DIAGNOSIS — M5442 Lumbago with sciatica, left side: Secondary | ICD-10-CM

## 2022-08-18 DIAGNOSIS — E66811 Obesity, class 1: Secondary | ICD-10-CM

## 2022-08-18 NOTE — Assessment & Plan Note (Signed)
Refer  urology for eval and nmaagement

## 2022-08-18 NOTE — Assessment & Plan Note (Signed)
Controlled, no change in medication DASH diet and commitment to daily physical activity for a minimum of 30 minutes discussed and encouraged, as a part of hypertension management. The importance of attaining a healthy weight is also discussed.     08/18/2022    9:30 AM 08/18/2022    9:05 AM 07/07/2022   11:01 AM 02/16/2022    9:01 AM 01/18/2022   11:15 AM 12/20/2021    1:07 PM 09/14/2021    4:10 PM  BP/Weight  Systolic BP 130 139 141 129 141 122 120  Diastolic BP 84 71 80 78 80 78 76  Wt. (Lbs)  187.08 188 188 191.6 189.8   BMI  33.14 kg/m2 33.3 kg/m2 33.3 kg/m2 33.94 kg/m2 33.62 kg/m2

## 2022-08-18 NOTE — Assessment & Plan Note (Signed)
Updated lab needed at/ before next visit.   

## 2022-08-18 NOTE — Assessment & Plan Note (Signed)
resolved 

## 2022-08-18 NOTE — Patient Instructions (Addendum)
Annual exam 10 /6 or after call if you need me sooner.  Labs today CBC lipid panel CMP and EGFR TSH and vitamin D.  Please schedule September mammogram at checkout.  You do need 2 vaccines which are at your pharmacy, COVID-vaccine and the RSV vaccine  You are referred to Urology because of persistent blood in your urine  It is important that you exercise regularly at least 30 minutes 5 times a week. If you develop chest pain, have severe difficulty breathing, or feel very tired, stop exercising immediately and seek medical attention   Thanks for choosing [facility], we consider it a privelige to serve you.

## 2022-08-18 NOTE — Assessment & Plan Note (Signed)
  Patient re-educated about  the importance of commitment to a  minimum of 150 minutes of exercise per week as able.  The importance of healthy food choices with portion control discussed, as well as eating regularly and within a 12 hour window most days. The need to choose "clean , green" food 50 to 75% of the time is discussed, as well as to make water the primary drink and set a goal of 64 ounces water daily.       08/18/2022    9:05 AM 07/07/2022   11:01 AM 02/16/2022    9:01 AM  Weight /BMI  Weight 187 lb 1.3 oz 188 lb 188 lb  Height 5\' 3"  (1.6 m) 5\' 3"  (1.6 m) 5\' 3"  (1.6 m)  BMI 33.14 kg/m2 33.3 kg/m2 33.3 kg/m2

## 2022-08-18 NOTE — Progress Notes (Signed)
[name]     MRN: [id number]      DOB: 04/15/1956   HPI Kristin Hall is here for follow up of acute sciatica, and re-evaluation of chronic medical conditions, medication management and review of any available recent lab and radiology data.  Preventive health is updated, specifically  Cancer screening and Immunization.   Questions or concerns regarding consultations or procedures which the PT has had in the interim are  addressed. The PT denies any adverse reactions to current medications since the last visit.  There are no new concerns.  There are no specific complaints , states she has fully recovered from recent sciatica and has an exercise plan in place to start in next several weeks  ROS Denies recent fever or chills. Denies sinus pressure, nasal congestion, ear pain or sore throat. Denies chest congestion, productive cough or wheezing. Denies chest pains, palpitations and leg swelling Denies abdominal pain, nausea, vomiting,diarrhea or constipation.   Denies dysuria, frequency, hesitancy or incontinence. Denies joint pain, swelling and limitation in mobility. Denies headaches, seizures, numbness, or tingling. Denies depression, anxiety or insomnia. Denies skin break down or rash.   PE  BP 130/84   Pulse 96   Ht 5\' 3"  (1.6 m)   Wt 187 lb 1.3 oz (84.9 kg)   SpO2 93%   BMI 33.14 kg/m   Patient alert and oriented and in no cardiopulmonary distress.  HEENT: No facial asymmetry, EOMI,     Neck supple .  Chest: Clear to auscultation bilaterally.  CVS: S1, S2 no murmurs, no S3.Regular rate.  ABD: Soft non tender.   Ext: No edema  MS: Adequate ROM spine, shoulders, hips and knees.  Skin: Intact, no ulcerations or rash noted.  Psych: Good eye contact, normal affect. Memory intact not anxious or depressed appearing.  CNS: CN 2-12 intact, power,  normal throughout.no focal deficits noted.   Assessment & Plan  Essential hypertension Controlled, no change in  medication DASH diet and commitment to daily physical activity for a minimum of 30 minutes discussed and encouraged, as a part of hypertension management. The importance of attaining a healthy weight is also discussed.     08/18/2022    9:30 AM 08/18/2022    9:05 AM 07/07/2022   11:01 AM 02/16/2022    9:01 AM 01/18/2022   11:15 AM 12/20/2021    1:07 PM 09/14/2021    4:10 PM  BP/Weight  Systolic BP 130 139 141 129 141 122 120  Diastolic BP 84 71 80 78 80 78 76  Wt. (Lbs)  187.08 188 188 191.6 189.8   BMI  33.14 kg/m2 33.3 kg/m2 33.3 kg/m2 33.94 kg/m2 33.62 kg/m2        Acute left-sided low back pain with left-sided sciatica resolved  Hyperlipemia Hyperlipidemia:Low fat diet discussed and encouraged.   Lipid Panel  Lab Results  Component Value Date   CHOL 161 07/29/2021   HDL 47 07/29/2021   LDLCALC 97 07/29/2021   TRIG 90 07/29/2021   CHOLHDL 3.4 07/29/2021    Updated lab needed at/ before next visit.   Obesity (BMI 30.0-34.9)  Patient re-educated about  the importance of commitment to a  minimum of 150 minutes of exercise per week as able.  The importance of healthy food choices with portion control discussed, as well as eating regularly and within a 12 hour window most days. The need to choose "clean , green" food 50 to 75% of the time is discussed, as well as  to make water the primary drink and set a goal of 64 ounces water daily.       08/18/2022    9:05 AM 07/07/2022   11:01 AM 02/16/2022    9:01 AM  Weight /BMI  Weight 187 lb 1.3 oz 188 lb 188 lb  Height 5\' 3"  (1.6 m) 5\' 3"  (1.6 m) 5\' 3"  (1.6 m)  BMI 33.14 kg/m2 33.3 kg/m2 33.3 kg/m2      Vitamin D deficiency Updated lab needed at/ before next visit.   Asymptomatic microscopic hematuria Refer  urology for eval and nmaagement

## 2022-08-18 NOTE — Assessment & Plan Note (Signed)
Hyperlipidemia:Low fat diet discussed and encouraged.   Lipid Panel  Lab Results  Component Value Date   CHOL 161 07/29/2021   HDL 47 07/29/2021   LDLCALC 97 07/29/2021   TRIG 90 07/29/2021   CHOLHDL 3.4 07/29/2021    Updated lab needed at/ before next visit.

## 2022-08-19 LAB — CMP14+EGFR
ALT: 17 IU/L (ref 0–32)
AST: 21 IU/L (ref 0–40)
Albumin/Globulin Ratio: 1.3 (ref 1.2–2.2)
Albumin: 4.4 g/dL (ref 3.9–4.9)
Alkaline Phosphatase: 190 IU/L — ABNORMAL HIGH (ref 44–121)
BUN/Creatinine Ratio: 10 — ABNORMAL LOW (ref 12–28)
BUN: 8 mg/dL (ref 8–27)
Bilirubin Total: 0.4 mg/dL (ref 0.0–1.2)
CO2: 25 mmol/L (ref 20–29)
Calcium: 10 mg/dL (ref 8.7–10.3)
Chloride: 100 mmol/L (ref 96–106)
Creatinine, Ser: 0.84 mg/dL (ref 0.57–1.00)
Globulin, Total: 3.3 g/dL (ref 1.5–4.5)
Glucose: 106 mg/dL — ABNORMAL HIGH (ref 70–99)
Potassium: 4.2 mmol/L (ref 3.5–5.2)
Sodium: 141 mmol/L (ref 134–144)
Total Protein: 7.7 g/dL (ref 6.0–8.5)
eGFR: 77 mL/min/{1.73_m2} (ref >59)

## 2022-08-19 LAB — TSH: TSH: 3.04 u[IU]/mL (ref 0.450–4.500)

## 2022-08-19 LAB — LIPID PANEL
Chol/HDL Ratio: 3.8 ratio (ref 0.0–4.4)
Cholesterol, Total: 172 mg/dL (ref 100–199)
HDL: 45 mg/dL (ref >39)
LDL Chol Calc (NIH): 107 mg/dL — ABNORMAL HIGH (ref 0–99)
Triglycerides: 109 mg/dL (ref 0–149)
VLDL Cholesterol Cal: 20 mg/dL (ref 5–40)

## 2022-08-19 LAB — CBC
Hematocrit: 40.5 % (ref 34.0–46.6)
Hemoglobin: 13.4 g/dL (ref 11.1–15.9)
MCH: 29.1 pg (ref 26.6–33.0)
MCHC: 33.1 g/dL (ref 31.5–35.7)
MCV: 88 fL (ref 79–97)
Platelets: 417 10*3/uL (ref 150–450)
RBC: 4.6 x10E6/uL (ref 3.77–5.28)
RDW: 12.4 % (ref 11.7–15.4)
WBC: 10.7 10*3/uL (ref 3.4–10.8)

## 2022-08-19 LAB — VITAMIN D 25 HYDROXY (VIT D DEFICIENCY, FRACTURES): Vit D, 25-Hydroxy: 41 ng/mL (ref 30.0–100.0)

## 2022-10-05 DIAGNOSIS — R12 Heartburn: Secondary | ICD-10-CM | POA: Diagnosis not present

## 2022-10-05 DIAGNOSIS — K2960 Other gastritis without bleeding: Secondary | ICD-10-CM | POA: Diagnosis not present

## 2022-10-08 DIAGNOSIS — R030 Elevated blood-pressure reading, without diagnosis of hypertension: Secondary | ICD-10-CM | POA: Diagnosis not present

## 2022-10-08 DIAGNOSIS — R12 Heartburn: Secondary | ICD-10-CM | POA: Diagnosis not present

## 2022-10-10 ENCOUNTER — Telehealth: Payer: Self-pay

## 2022-10-10 ENCOUNTER — Other Ambulatory Visit: Payer: Self-pay | Admitting: Internal Medicine

## 2022-10-10 MED ORDER — FAMOTIDINE 40 MG PO TABS: 40.0000 mg | ORAL_TABLET | Freq: Every day | ORAL | 5 refills | Status: DC

## 2022-10-10 NOTE — Telephone Encounter (Signed)
Spoke with the pt and was advised by her that she has flared up her acid reflux. The past Monday she ate a lot of strawberries and now she has burning and pain in her throat and chest. She was advised by acute care to double up on her Protonix but the pt states it is not help. The pt ask is there something that can be phoned in for her. Please advise

## 2022-10-10 NOTE — Telephone Encounter (Signed)
Pt phoned again stating she needed something phoned in to take for her acid reflux

## 2022-10-10 NOTE — Telephone Encounter (Signed)
Pepcid sent to patient's pharmacy to take on top of her pantoprazole twice daily to get her over the acute flare.  Once she is feeling better, she can stop the Pepcid and eventually wean back down to pantoprazole once daily.  Thank you

## 2022-10-11 NOTE — Telephone Encounter (Signed)
NOTED   Phoned and spoke with the pt this morning and advised of the note, medication use, and instructions. Pt expressed understanding

## 2022-10-15 ENCOUNTER — Telehealth: Payer: Self-pay | Admitting: Internal Medicine

## 2022-10-15 ENCOUNTER — Emergency Department (HOSPITAL_COMMUNITY): Payer: Medicare Other

## 2022-10-15 ENCOUNTER — Other Ambulatory Visit: Payer: Self-pay

## 2022-10-15 ENCOUNTER — Emergency Department (HOSPITAL_COMMUNITY)
Admission: EM | Admit: 2022-10-15 | Discharge: 2022-10-15 | Disposition: A | Discharge status: Home / Self Care | Payer: Medicare Other | Attending: Student | Admitting: Student

## 2022-10-15 ENCOUNTER — Encounter (HOSPITAL_COMMUNITY): Payer: Self-pay

## 2022-10-15 DIAGNOSIS — Z79899 Other long term (current) drug therapy: Secondary | ICD-10-CM | POA: Diagnosis not present

## 2022-10-15 DIAGNOSIS — I10 Essential (primary) hypertension: Secondary | ICD-10-CM | POA: Diagnosis not present

## 2022-10-15 DIAGNOSIS — R0789 Other chest pain: Secondary | ICD-10-CM | POA: Diagnosis not present

## 2022-10-15 DIAGNOSIS — R079 Chest pain, unspecified: Secondary | ICD-10-CM | POA: Diagnosis not present

## 2022-10-15 LAB — COMPREHENSIVE METABOLIC PANEL
ALT: 23 U/L (ref 0–44)
AST: 26 U/L (ref 15–41)
Albumin: 4.2 g/dL (ref 3.5–5.0)
Alkaline Phosphatase: 139 U/L — ABNORMAL HIGH (ref 38–126)
Anion gap: 10 (ref 5–15)
BUN: 8 mg/dL (ref 8–23)
CO2: 27 mmol/L (ref 22–32)
Calcium: 9.9 mg/dL (ref 8.9–10.3)
Chloride: 102 mmol/L (ref 98–111)
Creatinine, Ser: 0.88 mg/dL (ref 0.44–1.00)
GFR, Estimated: >60 mL/min (ref >60)
Glucose, Bld: 116 mg/dL — ABNORMAL HIGH (ref 70–99)
Potassium: 3.8 mmol/L (ref 3.5–5.1)
Sodium: 139 mmol/L (ref 135–145)
Total Bilirubin: 0.7 mg/dL (ref 0.3–1.2)
Total Protein: 8.4 g/dL — ABNORMAL HIGH (ref 6.5–8.1)

## 2022-10-15 LAB — CBC WITH DIFFERENTIAL/PLATELET
Abs Immature Granulocytes: 0.04 10*3/uL (ref 0.00–0.07)
Basophils Absolute: 0 10*3/uL (ref 0.0–0.1)
Basophils Relative: 0 %
Eosinophils Absolute: 0 10*3/uL (ref 0.0–0.5)
Eosinophils Relative: 0 %
HCT: 41.8 % (ref 36.0–46.0)
Hemoglobin: 13.4 g/dL (ref 12.0–15.0)
Immature Granulocytes: 0 %
Lymphocytes Relative: 23 %
Lymphs Abs: 2.6 10*3/uL (ref 0.7–4.0)
MCH: 29.2 pg (ref 26.0–34.0)
MCHC: 32.1 g/dL (ref 30.0–36.0)
MCV: 91.1 fL (ref 80.0–100.0)
Monocytes Absolute: 0.8 10*3/uL (ref 0.1–1.0)
Monocytes Relative: 7 %
Neutro Abs: 7.8 10*3/uL — ABNORMAL HIGH (ref 1.7–7.7)
Neutrophils Relative %: 70 %
Platelets: 424 10*3/uL — ABNORMAL HIGH (ref 150–400)
RBC: 4.59 MIL/uL (ref 3.87–5.11)
RDW: 13 % (ref 11.5–15.5)
WBC: 11.2 10*3/uL — ABNORMAL HIGH (ref 4.0–10.5)
nRBC: 0 % (ref 0.0–0.2)

## 2022-10-15 LAB — TROPONIN I (HIGH SENSITIVITY)
Troponin I (High Sensitivity): 3 ng/L (ref <18)
Troponin I (High Sensitivity): 3 ng/L (ref <18)

## 2022-10-15 MED ORDER — LIDOCAINE VISCOUS HCL 2 % MT SOLN
15.0000 mL | Freq: Once | OROMUCOSAL | Status: AC
  Administered 2022-10-15: 15 mL via ORAL
  Filled 2022-10-15: qty 15

## 2022-10-15 MED ORDER — KETOROLAC TROMETHAMINE 15 MG/ML IJ SOLN
15.0000 mg | Freq: Once | INTRAMUSCULAR | Status: AC
  Administered 2022-10-15: 15 mg via INTRAVENOUS
  Filled 2022-10-15: qty 1

## 2022-10-15 MED ORDER — LIDOCAINE 5 % EX PTCH: 1.0000 | MEDICATED_PATCH | CUTANEOUS | 0 refills | Status: DC

## 2022-10-15 MED ORDER — ALUM & MAG HYDROXIDE-SIMETH 200-200-20 MG/5ML PO SUSP
30.0000 mL | Freq: Once | ORAL | Status: AC
  Administered 2022-10-15: 30 mL via ORAL
  Filled 2022-10-15: qty 30

## 2022-10-15 MED ORDER — LIDOCAINE 5 % EX PTCH
1.0000 | MEDICATED_PATCH | CUTANEOUS | Status: DC
  Administered 2022-10-15: 1 via TRANSDERMAL
  Filled 2022-10-15: qty 1

## 2022-10-15 MED ORDER — SUCRALFATE 1 GM/10ML PO SUSP: 1.0000 g | Freq: Three times a day (TID) | ORAL | 0 refills | Status: DC

## 2022-10-15 MED ORDER — FAMOTIDINE IN NACL 20-0.9 MG/50ML-% IV SOLN
INTRAVENOUS | Status: AC
  Filled 2022-10-15: qty 50

## 2022-10-15 MED ORDER — FAMOTIDINE IN NACL 20-0.9 MG/50ML-% IV SOLN
20.0000 mg | Freq: Once | INTRAVENOUS | Status: AC
  Administered 2022-10-15: 20 mg via INTRAVENOUS
  Filled 2022-10-15: qty 50

## 2022-10-15 NOTE — ED Notes (Signed)
Pulled Pepcid from [facility] and took to pt room. Cracked open and began to mix when I looked at the bag and it was not the correct medication, so I brought it back to the [facility] to waste, then had to override in order to obtain the correct medication for the pt.

## 2022-10-15 NOTE — ED Triage Notes (Signed)
Pt is complaining of chest pain, center of her chest with burning across her chest. Pt states the pain is worse when she eats. Non stop pain, no stabbing, no sharp pain. Pt went to urgent care about a week ago, and they told her to double up on her protonix. Pt states she has acid reflux and this pain started after she ate a bunch of strawberries. She has been taking her Protonix, she has also taken mylanta and tums with no relief.

## 2022-10-15 NOTE — Discharge Instructions (Signed)
For pain:  - Acetaminophen 1000 mg three times daily (every 8 hours) - lidoderm patches every 12 hours - carafate solution  every 6 hours

## 2022-10-15 NOTE — Telephone Encounter (Signed)
Patient seen in ER today for epigastric pain, acid reflux exacerbation.  Can we see if we can get her in the office this week with an app as I do not have any clinic days this week? She likely needs EGD scheduled.  Thank you!

## 2022-10-15 NOTE — ED Provider Notes (Signed)
[place] EMERGENCY DEPARTMENT AT [facility] Provider Note  CSN: [id number] Arrival date & time: 10/15/22 4098  Chief Complaint(s) Chest Pain  HPI Kristin Hall is a 67 y.o. female with PMH HTN, HLD, GERD who presents emergency room for evaluation of chest pain.  Patient states that over the last 1 week she has had severe persistent central chest pain that she describes as stabbing and burning.  She states that these symptoms began after eating strawberries 1 week ago and she spoke with the gastroenterology office (follows with Dr. Marletta Lor) who instructed the patient to transition to twice daily PPI with Pepcid and they will follow in the outpatient setting.  Her pain has not improved over the course of this week and she returns emergency department for further evaluation.  Here in the ER, she endorses persistent chest pain but denies shortness of breath, nausea, vomiting, diaphoresis or any exertional component to the pain.  Pain is worse with movement and she states that significant worsens after eating.  Denies abdominal pain, headache, fever or other systemic symptoms.   Past Medical History Past Medical History:  Diagnosis Date   Elevated alkaline phosphatase level    Headache    Hyperlipidemia    Hypertension    OSA on CPAP    Thrombocytosis 07/23/2015   Patient Active Problem List   Diagnosis Date Noted   Vitamin D deficiency 08/18/2022   Asymptomatic microscopic hematuria 08/18/2022   Acute left-sided low back pain with left-sided sciatica 07/08/2022   Pain and numbness of left upper extremity 07/08/2022   Numbness and tingling in both hands 07/08/2022   Skin lesion 09/18/2021   Diastolic dysfunction without heart failure 02/11/2021   Heart valve disorder 02/11/2021   Personal history of colonic polyps    OSA on CPAP 02/18/2018   Urinary frequency 03/05/2016   Thrombocytosis 07/23/2015   Obesity (BMI 30.0-34.9) 01/01/2015   Elevated alkaline phosphatase level  04/21/2012   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 Authorizing Provider  famotidine (PEPCID) 40 MG tablet Take 1 tablet (40 mg total) by mouth daily. 10/10/22 04/08/23  Lanelle Bal, DO  amLODipine (NORVASC) 2.5 MG tablet Take 1 tablet (2.5 mg total) by mouth daily. 02/16/22   Kerri Perches, MD  calcium citrate (CALCITRATE - DOSED IN MG ELEMENTAL CALCIUM) 950 (200 Ca) MG tablet Take 1,200 mg of elemental calcium by mouth daily. Otc for calcium 02/06/22   [provider]  diltiazem (TIAZAC) 300 MG 24 hr capsule Take 1 capsule (300 mg total) by mouth daily. 02/16/22   Kerri Perches, MD  ezetimibe-simvastatin (VYTORIN) 10-10 MG tablet Take 1 tablet by mouth at bedtime for cholesterol 02/16/22   Kerri Perches, MD  gabapentin (NEURONTIN) 100 MG capsule Take one to two capsules at bedtime for  2 weeks, then as needed 07/07/22   Kerri Perches, MD  Multiple Vitamins-Minerals (ALIVE WOMENS 50+) TABS Take 1 tablet by mouth daily.    [provider]  olmesartan-hydrochlorothiazide (BENICAR HCT) 40-25 MG tablet TAKE 1 TABLET BY MOUTH DAILY 05/18/22   Kerri Perches, MD  pantoprazole (PROTONIX) 40 MG tablet Take 1 tablet (40 mg total) by mouth daily. 02/16/22   Kerri Perches, MD  potassium chloride SA (KLOR-CON M) 20 MEQ tablet TAKE 1 TABLET(20 MEQ) BY MOUTH TWICE DAILY  05/09/22   Kerri Perches, MD                                                                                                                                    Past Surgical History Past Surgical History:  Procedure Laterality Date   ABDOMINAL HYSTERECTOMY     partial    COLONOSCOPY  10/04/2006   SLF: Normal retroflexed view of the rectum/Normal colon without evidence of polyps, masses, inflammatory changes,  diverticula or arteriovenous malformations   COLONOSCOPY N/A 12/07/2016   Dr. Darrick Penna: 15 mm polyp in cecum, s/p APC and clips. One 30 mm polyp in sigmoid, s/p clip, injection, tattooed. Path with one adenoma and one hamartomatous polyp. Surveillance 2021.    COLONOSCOPY N/A 09/08/2019   Procedure: COLONOSCOPY;  Surgeon: West Bali, MD;  Location: [facility];  Service: Endoscopy;  Laterality: N/A;  8:30AM   POLYPECTOMY  12/07/2016   Procedure: POLYPECTOMY;  Surgeon: West Bali, MD;  Location: [facility];  Service: Endoscopy;;  cecal;   POLYPECTOMY  09/08/2019   Procedure: POLYPECTOMY;  Surgeon: West Bali, MD;  Location: [facility];  Service: Endoscopy;;   Family History Family History  Problem Relation Age of Onset   Cancer Mother        Breast   Dementia Mother    Cancer Father        Lung Cancer, deceased at 66   Colon cancer Neg Hx    Liver disease Neg Hx     Social History Social History   Tobacco Use   Smoking status: Never   Smokeless tobacco: Never  Vaping Use   Vaping Use: Never used  Substance Use Topics   Alcohol use: No   Drug use: No   Allergies Aspirin, Metronidazole, and Valsartan  Review of Systems Review of Systems  Cardiovascular:  Positive for chest pain.    Physical Exam Vital Signs  I have reviewed the triage vital signs BP (!) 151/84   Pulse 98   Temp 97.8 F (36.6 C) (Oral)   Resp 18   Ht 5\' 3"  (1.6 m)   Wt 83.9 kg   SpO2 97%   BMI 32.77 kg/m   Physical Exam Vitals and nursing note reviewed.  Constitutional:      General: She is not in acute distress.    Appearance: She is well-developed.  HENT:     Head: Normocephalic and atraumatic.  Eyes:     Conjunctiva/sclera: Conjunctivae normal.  Cardiovascular:     Rate and Rhythm: Normal rate and regular rhythm.     Heart sounds: No murmur heard. Pulmonary:     Effort: Pulmonary effort is normal. No respiratory distress.     Breath sounds: Normal breath sounds.   Chest:     Chest wall: Tenderness present.  Abdominal:     Palpations: Abdomen is soft.     Tenderness: There  is no abdominal tenderness.  Musculoskeletal:        General: No swelling.     Cervical back: Neck supple.  Skin:    General: Skin is warm and dry.     Capillary Refill: Capillary refill takes less than 2 seconds.  Neurological:     Mental Status: She is alert.  Psychiatric:        Mood and Affect: Mood normal.     ED Results and Treatments Labs (all labs ordered are listed, but only abnormal results are displayed) Labs Reviewed  COMPREHENSIVE METABOLIC PANEL - Abnormal; Notable for the following components:      Result Value   Glucose, Bld 116 (*)    Total Protein 8.4 (*)    Alkaline Phosphatase 139 (*)    All other components within normal limits  CBC WITH DIFFERENTIAL/PLATELET - Abnormal; Notable for the following components:   WBC 11.2 (*)    Platelets 424 (*)    Neutro Abs 7.8 (*)    All other components within normal limits  TROPONIN I (HIGH SENSITIVITY)  TROPONIN I (HIGH SENSITIVITY)                                                                                                                          Radiology DG Chest 2 View  Result Date: 10/15/2022 CLINICAL DATA:  68 year old female with chest pain, burning across chest. EXAM: CHEST - 2 VIEW COMPARISON:  Chest radiographs 06/18/2016 and earlier. FINDINGS: Upright AP and lateral views of the chest at 0831 hours. Lower lung volumes on both views. Mediastinal contours remain within normal limits. Visualized tracheal air column is within normal limits. No pneumothorax, pulmonary edema, pleural effusion or confluent lung opacity. No acute osseous abnormality identified. Negative visible bowel gas. IMPRESSION: Lower lung volumes.  Otherwise no acute cardiopulmonary abnormality. Electronically Signed   By: Odessa Fleming M.D.   On: 10/15/2022 08:39    Pertinent labs & imaging results that were available during my care  of the patient were reviewed by me and considered in my medical decision making (see MDM for details).  Medications Ordered in ED Medications  famotidine (PEPCID) 20-0.9 MG/50ML-% IVPB (has no administration in time range)  lidocaine (LIDODERM) 5 % 1 patch (1 patch Transdermal Patch Applied 10/15/22 0912)  ketorolac (TORADOL) 15 MG/ML injection 15 mg (has no administration in time range)  alum & mag hydroxide-simeth (MAALOX/MYLANTA) 200-200-20 MG/5ML suspension 30 mL (30 mLs Oral Given 10/15/22 0817)    And  lidocaine (XYLOCAINE) 2 % viscous mouth solution 15 mL (15 mLs Oral Given 10/15/22 0817)  famotidine (PEPCID) IVPB 20 mg premix (0 mg Intravenous Stopped 10/15/22 0913)  Procedures Procedures  (including critical care time)  Medical Decision Making / ED Course   This patient presents to the ED for concern of chest pain, this involves an extensive number of treatment options, and is a complaint that carries with it a high risk of complications and morbidity.  The differential diagnosis includes ACS, Pneumothorax, Pneumonia, Esophageal Rupture, PE, Tamponade/Pericardial Effusion, pericarditis, esophageal spasm, dysrhythmia, GERD, costochondritis.  MDM: Patient seen emergency room for evaluation of chest pain.  Physical exam reveals reproducible tenderness over the sternum but is otherwise unremarkable.  ECG unremarkable with no elevations or depressions, no secondary signs of ischemia.  Laboratory evaluation with a mild leukocytosis to 11.4 but is otherwise unremarkable.  Troponin and delta troponin unremarkable.  Patient received an initial GI cocktail with Pepcid and did not have symptomatic improvement with this.  A lidocaine patch was applied over the chest and patient did start to have improvement.  A single dose of Toradol was then given and she had significant  improvement of her chest pain.  I spoke with Dr. Marletta Lor of gastroenterology who will help establish outpatient follow-up to discuss EGD and is recommending addition of Carafate to her regimen.  Patient will be started on a pain regimen with Lidoderm and Tylenol and will follow-up outpatient with gastroenterology.  Patient is low risk by Wells criteria and very low suspicion for PE especially as she has had no shortness of breath, no pleurisy and no tachycardia.  Patient presentation currently is more consistent with costochondritis versus GERD given reproducible symptoms.  Patient be discharged with outpatient follow-up and strict return precautions of which she voiced understanding.   Additional history obtained: -Additional history obtained from son -External records from outside source obtained and reviewed including: Chart review including previous notes, labs, imaging, consultation notes   Lab Tests: -I ordered, reviewed, and interpreted labs.   The pertinent results include:   Labs Reviewed  COMPREHENSIVE METABOLIC PANEL - Abnormal; Notable for the following components:      Result Value   Glucose, Bld 116 (*)    Total Protein 8.4 (*)    Alkaline Phosphatase 139 (*)    All other components within normal limits  CBC WITH DIFFERENTIAL/PLATELET - Abnormal; Notable for the following components:   WBC 11.2 (*)    Platelets 424 (*)    Neutro Abs 7.8 (*)    All other components within normal limits  TROPONIN I (HIGH SENSITIVITY)  TROPONIN I (HIGH SENSITIVITY)      EKG   EKG Interpretation  Date/Time:  Sunday October 15 2022 08:06:38 EDT Ventricular Rate:  92 PR Interval:  169 QRS Duration: 94 QT Interval:  363 QTC Calculation: 449 R Axis:   54 Text Interpretation: Sinus rhythm LAE, consider biatrial enlargement Confirmed by Rayner Erman ([id number]) on 10/15/2022 2:27:27 PM         Imaging Studies ordered: I ordered imaging studies including chest x-ray I independently visualized  and interpreted imaging. I agree with the radiologist interpretation   Medicines ordered and prescription drug management: Meds ordered this encounter  Medications   AND Linked Order Group    alum & mag hydroxide-simeth (MAALOX/MYLANTA) 200-200-20 MG/5ML suspension 30 mL    lidocaine (XYLOCAINE) 2 % viscous mouth solution 15 mL   famotidine (PEPCID) IVPB 20 mg premix   famotidine (PEPCID) 20-0.9 MG/50ML-% IVPB    Rosanna Randy K: cabinet override   lidocaine (LIDODERM) 5 % 1 patch   ketorolac (TORADOL) 15 MG/ML injection 15 mg    -  I have reviewed the patients home medicines and have made adjustments as needed  Critical interventions none  Consultations Obtained: I requested consultation with the gastroenterologist on-call Dr. Marletta Lor,  and discussed lab and imaging findings as well as pertinent plan - they recommend: Outpatient follow-up and addition of Carafate   Cardiac Monitoring: The patient was maintained on a cardiac monitor.  I personally viewed and interpreted the cardiac monitored which showed an underlying rhythm of: NSR  Social Determinants of Health:  Factors impacting patients care include: none   Reevaluation: After the interventions noted above, I reevaluated the patient and found that they have :improved  Co morbidities that complicate the patient evaluation  Past Medical History:  Diagnosis Date   Elevated alkaline phosphatase level    Headache    Hyperlipidemia    Hypertension    OSA on CPAP    Thrombocytosis 07/23/2015      Dispostion: I considered admission for this patient, but at this time she does not meet inpatient criteria for admission she is safe for discharge with outpatient gastroenterology follow-up and return precautions     Final Clinical Impression(s) / ED Diagnoses Final diagnoses:  None     @PCDICTATION @    Glendora Score, MD 10/15/22 1430

## 2022-10-17 NOTE — Progress Notes (Signed)
Referring Provider: Kerri Perches, MD Primary Care Physician:  Kerri Perches, MD Primary GI Physician: Dr. Marletta Lor  Chief Complaint  Patient presents with   Chest Pain    Having pains when she eats in her chest area.     HPI:   Kristin Hall is a 67 y.o. female presenting today with history of chronically elevated alkaline phosphatase with extensive workup previously including CT scan, MRI/MRCP which have been WNL. Last ultrasound 07/16/2020 showed fatty liver otherwise unremarkable. GGT WNL.  Also with history of chronic GERD.  She is presenting today for ER follow-up.   She was evaluated in the emergency room 10/15/22 for severe persistent centreal chest pain described as stabbing and burning that started after eating strawberries 1 week ago. No improvement with increasing PPI to twice daily and adding pepcid. She denied exertional symptoms but reported worsening with meals.  Troponins were flat.  No significant laboratory abnormalities.  She was given famotidine, viscous lidocaine, Maalox without improvement.  She then received a lidocaine patch applied over the chest with some improvement thereafter.  Then given a dose of Toradol with significant improvement thereafter.  ED physician spoke with Dr. Marletta Lor who recommended outpatient follow-up, discuss EGD, and add Carafate.  She was also advised to use lidoderm patch and tylenol.   Today:  Reports about 2 weeks ago, she developed chest pain.  She thought that it was her acid reflux flaring up after eating a bunch of strawberries as she also had some pain after eating.  She had no improvement with increasing PPI to twice daily and adding famotidine, so she went to the emergency room.  She was told that she had a pulled muscle and was to start using lidocaine patches and taking Tylenol.  She was also prescribed Carafate 4 times daily.  Since that time, her abdominal pain has improved quite a bit.  Postprandial chest pain has  completely resolved.  No typical heart burn symptoms, nausea, vomiting, or dysphagia.  She is only taking pantoprazole 40 mg once daily at this point.  Her residual chest pain is now primarily in the left upper chest.  No exertional symptoms.  It is reproducible.   After further questioning, she now remembers that prior to onset of chest pain, she had also been working quite a bit outside in the garden, picking up flowerpots.  It is possible that she pulled something.   Denies BRBPR or melena.  No regular NSAID use.   No prior EGD.   Past Medical History:  Diagnosis Date   Elevated alkaline phosphatase level    Headache    Hyperlipidemia    Hypertension    OSA on CPAP    Thrombocytosis 07/23/2015    Past Surgical History:  Procedure Laterality Date   ABDOMINAL HYSTERECTOMY     partial    COLONOSCOPY  10/04/2006   SLF: Normal retroflexed view of the rectum/Normal colon without evidence of polyps, masses, inflammatory changes, diverticula or arteriovenous malformations   COLONOSCOPY N/A 12/07/2016   Dr. Darrick Penna: 15 mm polyp in cecum, s/p APC and clips. One 30 mm polyp in sigmoid, s/p clip, injection, tattooed. Path with one adenoma and one hamartomatous polyp. Surveillance 2021.    COLONOSCOPY N/A 09/08/2019   Procedure: COLONOSCOPY;  Surgeon: West Bali, MD;  [facility];  Service: Endoscopy;  Laterality: N/A;  8:30AM   POLYPECTOMY  12/07/2016   Procedure: POLYPECTOMY;  Surgeon: West Bali, MD;  [facility];  Service: Endoscopy;;  cecal;   POLYPECTOMY  09/08/2019   Procedure: POLYPECTOMY;  Surgeon: West Bali, MD;  [facility];  Service: Endoscopy;;    Current Outpatient Medications  Medication Sig Dispense Refill   amLODipine (NORVASC) 2.5 MG tablet Take 1 tablet (2.5 mg total) by mouth daily. 90 tablet 3   calcium citrate (CALCITRATE - DOSED IN MG ELEMENTAL CALCIUM) 950 (200 Ca) MG tablet Take 1,200 mg of elemental calcium by mouth  daily. Otc for calcium     diltiazem (TIAZAC) 300 MG 24 hr capsule Take 1 capsule (300 mg total) by mouth daily. 90 capsule 3   ezetimibe-simvastatin (VYTORIN) 10-10 MG tablet Take 1 tablet by mouth at bedtime for cholesterol 90 tablet 3   lidocaine (LIDODERM) 5 % Place 1 patch onto the skin daily. Remove & Discard patch within 12 hours or as directed by MD 30 patch 0   Multiple Vitamins-Minerals (ALIVE WOMENS 50+) TABS Take 1 tablet by mouth daily.     olmesartan-hydrochlorothiazide (BENICAR HCT) 40-25 MG tablet TAKE 1 TABLET BY MOUTH DAILY 90 tablet 3   pantoprazole (PROTONIX) 40 MG tablet Take 1 tablet (40 mg total) by mouth daily. 90 tablet 3   potassium chloride SA (KLOR-CON M) 20 MEQ tablet TAKE 1 TABLET(20 MEQ) BY MOUTH TWICE DAILY 180 tablet 3   sucralfate (CARAFATE) 1 g tablet Take 1 tablet (1 g total) by mouth 4 (four) times daily -  with meals and at bedtime. 120 tablet 0   famotidine (PEPCID) 40 MG tablet Take 1 tablet (40 mg total) by mouth daily. (Patient not taking: Reported on 10/18/2022) 30 tablet 5   gabapentin (NEURONTIN) 100 MG capsule Take one to two capsules at bedtime for  2 weeks, then as needed (Patient not taking: Reported on 10/18/2022) 40 capsule 0   No current facility-administered medications for this visit.    Allergies as of 10/18/2022 - Review Complete 10/18/2022  Allergen Reaction Noted   Aspirin Other (See Comments) 09/22/2013   Metronidazole Hives and Itching 11/12/2007   Valsartan Cough 04/19/2015    Family History  Problem Relation Age of Onset   Cancer Mother        Breast   Dementia Mother    Cancer Father        Lung Cancer, deceased at 68   Colon cancer Neg Hx    Liver disease Neg Hx     Social History   Socioeconomic History   Marital status: Widowed    Spouse name: Not on file   Number of children: Not on file   Years of education: Not on file   Highest education level: Not on file  Occupational History   Not on file  Tobacco Use    Smoking status: Never   Smokeless tobacco: Never  Vaping Use   Vaping Use: Never used  Substance and Sexual Activity   Alcohol use: No   Drug use: No   Sexual activity: Yes  Other Topics Concern   Not on file  Social History Narrative   Not on file   Social Determinants of Health   Financial Resource Strain: Not on file  Food Insecurity: Not on file  Transportation Needs: Not on file  Physical Activity: Not on file  Stress: Not on file  Social Connections: Not on file    Review of Systems: Gen: Denies fever, chills, cold or flulike symptoms, presyncope, syncope. CV: Denies exertional chest pain or palpitations. Resp: Denies  dyspnea, cough. GI: See HPI Heme: See HPI  Physical Exam: BP 132/83 (BP Location: Right Arm, Patient Position: Sitting, Cuff Size: Normal)   Pulse 89   Temp 97.6 F (36.4 C) (Temporal)   Ht 5\' 3"  (1.6 m)   Wt 185 lb 9.6 oz (84.2 kg)   BMI 32.88 kg/m  General:   Alert and oriented. No distress noted. Pleasant and cooperative.  Head:  Normocephalic and atraumatic. Eyes:  Conjuctiva clear without scleral icterus. Heart:  S1, S2 present without murmurs appreciated. Lungs:  Clear to auscultation bilaterally. No wheezes, rales, or rhonchi. No distress.  Abdomen:  +BS, soft, non-tender and non-distended. No rebound or guarding. No HSM or masses noted. Msk:  Symmetrical without gross deformities. Normal posture. Extremities:  Without edema. Neurologic:  Alert and  oriented x4 Psych:  Normal mood and affect.    Assessment:  67 year old female with history of GERD, chronically elevated alkaline phosphatase with benign workup previously, HTN, HLD, thrombocytosis, presenting today for ER follow-up of chest pain.   I suspect her chest pain is likely multifactorial.  May have experienced a GERD flare she was having some postprandial symptoms; however, she also has reproducible chest pain and reports working quite a bit outside, picking up flowerpots prior  to onset as well.  She has had resolution of postprandial symptoms with the addition of Carafate and has no GI alarm symptoms. Denies ever having dysphagia, abdominal pain, brbpr, or melena.  Her residual chest pain is primarily left upper chest and is reproducible.  Denies any exertional chest pain and overall this is also improved with the use of lidocaine patches and Tylenol.   We discussed the possibility of an EGD, but she would like to continue to monitor over the next few weeks.   Plan:  Continue pantoprazole 40 mg daily. Continue Carafate 1 g 4 times daily to complete a 30-day course. May use famotidine as needed for breakthrough heartburn. Reinforced GERD diet/lifestyle.  Written instructions provided on AVS. Continue lidocaine patches and Tylenol to help with MSK pain. Follow-up in 4 weeks.   Kristin Memos, PA-C [facility] 10/18/2022

## 2022-10-18 ENCOUNTER — Ambulatory Visit: Payer: Medicare Other | Admitting: Gastroenterology

## 2022-10-18 ENCOUNTER — Encounter: Payer: Self-pay | Admitting: Gastroenterology

## 2022-10-18 ENCOUNTER — Other Ambulatory Visit: Payer: Self-pay | Admitting: Gastroenterology

## 2022-10-18 VITALS — BP 132/83 | HR 89 | Temp 97.6°F | Ht 63.0 in | Wt 185.6 lb

## 2022-10-18 DIAGNOSIS — R079 Chest pain, unspecified: Secondary | ICD-10-CM | POA: Diagnosis not present

## 2022-10-18 DIAGNOSIS — K219 Gastro-esophageal reflux disease without esophagitis: Secondary | ICD-10-CM | POA: Diagnosis not present

## 2022-10-18 MED ORDER — SUCRALFATE 1 G PO TABS
1.0000 g | ORAL_TABLET | Freq: Three times a day (TID) | ORAL | 0 refills | Status: DC
Start: 2022-10-18 — End: 2022-11-15

## 2022-10-18 NOTE — Patient Instructions (Signed)
As we discussed, your symptoms do seem more consistent with a musculoskeletal cause of your chest pain.  However, as you were experiencing some pain after eating, you may have also been experiencing a flare of your acid reflux.  As your pain after eating has resolved, we will continue to monitor for a few weeks.  Continue using lidocaine patches and taking Tylenol to help with your musculoskeletal pain.  Continue taking pantoprazole 40 mg daily.  I would like for you to complete a full 30-day course of Carafate 4 times daily.  I am sending in a new prescription to your pharmacy.  You may use famotidine if needed if you have some breakthrough heartburn symptoms.  Follow a GERD diet:  Avoid fried, fatty, greasy, spicy, citrus foods. Avoid caffeine and carbonated beverages. Avoid chocolate.  We will follow-up with you in about 4 weeks.  Do not hesitate to call sooner if you have questions or concerns.  It was good to see you today!  Ermalinda Memos, PA-C [facility]

## 2022-10-19 ENCOUNTER — Telehealth: Payer: Self-pay

## 2022-10-19 NOTE — Telephone Encounter (Signed)
Transition Care Management Unsuccessful Follow-up Telephone Call  Date of discharge and from where:  10/15/2022 [facility]  Attempts:  1st Attempt  Reason for unsuccessful TCM follow-up call:  Left voice message  Kristin Hall [place]  [facility] Population Health Community Resource Care Guide   ??[email or web address]  ?? [phone]   Website: [email or web address]  [email or web address]      

## 2022-10-20 ENCOUNTER — Telehealth: Payer: Self-pay

## 2022-10-20 NOTE — Telephone Encounter (Signed)
Transition Care Management Unsuccessful Follow-up Telephone Call  Date of discharge and from where:  10/15/2022 [facility]  Attempts:  2nd Attempt  Reason for unsuccessful TCM follow-up call:  Left voice message  Kristin Hall [facility]  [facility] Population Health Community Resource Care Guide   [email or web address]  ?? [phone]   Website: [email or web address]  [email or web address]

## 2022-11-02 ENCOUNTER — Encounter: Payer: Self-pay | Admitting: Urology

## 2022-11-02 ENCOUNTER — Ambulatory Visit (INDEPENDENT_AMBULATORY_CARE_PROVIDER_SITE_OTHER): Payer: Medicare Other | Admitting: Urology

## 2022-11-02 VITALS — BP 157/83 | HR 108 | Ht 63.0 in | Wt 185.6 lb

## 2022-11-02 DIAGNOSIS — R3129 Other microscopic hematuria: Secondary | ICD-10-CM

## 2022-11-02 DIAGNOSIS — R350 Frequency of micturition: Secondary | ICD-10-CM

## 2022-11-02 LAB — URINALYSIS, ROUTINE W REFLEX MICROSCOPIC
Bilirubin, UA: NEGATIVE
Glucose, UA: NEGATIVE
Ketones, UA: NEGATIVE
Leukocytes,UA: NEGATIVE
Nitrite, UA: NEGATIVE
Protein,UA: NEGATIVE
Specific Gravity, UA: 1.015 (ref 1.005–1.030)
Urobilinogen, Ur: 0.2 mg/dL (ref 0.2–1.0)
pH, UA: 7 (ref 5.0–7.5)

## 2022-11-02 LAB — MICROSCOPIC EXAMINATION: Epithelial Cells (non renal): >10 /hpf — AB (ref 0–10)

## 2022-11-02 NOTE — Progress Notes (Signed)
Subjective: 1. Microscopic hematuria   2. Urinary frequency      Consult requested by Syliva Overman MD  Kristin Hall is a 67 yo female who is sent for microhematuria.   She has had trace blood on her two most recent UA's but the micro is negative but she has 3-10 RBC's today.  She has had some frequency of urination.  She has nocturia x 1.  She has no issues with urgency.  She has small voids at times but she generally has a good stream. She doesn't feel she empties.  She had a hysterectomy but no bladder surgery.  She has had some UTI's but not recently. She has had no stones.  She has no incontinence.  She is a non-smoker.  ROS:  ROS  Allergies  Allergen Reactions   Aspirin Other (See Comments)    Generalized aches   Metronidazole Hives and Itching   Valsartan Cough    Past Medical History:  Diagnosis Date   Elevated alkaline phosphatase level    Headache    Hyperlipidemia    Hypertension    OSA on CPAP    Thrombocytosis 07/23/2015    Past Surgical History:  Procedure Laterality Date   ABDOMINAL HYSTERECTOMY     partial    COLONOSCOPY  10/04/2006   SLF: Normal retroflexed view of the rectum/Normal colon without evidence of polyps, masses, inflammatory changes, diverticula or arteriovenous malformations   COLONOSCOPY N/A 12/07/2016   Dr. Darrick Penna: 15 mm polyp in cecum, s/p APC and clips. One 30 mm polyp in sigmoid, s/p clip, injection, tattooed. Path with one adenoma and one hamartomatous polyp. Surveillance 2021.    COLONOSCOPY N/A 09/08/2019   Procedure: COLONOSCOPY;  Surgeon: West Bali, MD;  [facility];  Service: Endoscopy;  Laterality: N/A;  8:30AM   POLYPECTOMY  12/07/2016   Procedure: POLYPECTOMY;  Surgeon: West Bali, MD;  [facility];  Service: Endoscopy;;  cecal;   POLYPECTOMY  09/08/2019   Procedure: POLYPECTOMY;  Surgeon: West Bali, MD;  [facility];  Service: Endoscopy;;    Social History   Socioeconomic  History   Marital status: Widowed    Spouse name: Not on file   Number of children: Not on file   Years of education: Not on file   Highest education level: Not on file  Occupational History   Not on file  Tobacco Use   Smoking status: Never   Smokeless tobacco: Never  Vaping Use   Vaping Use: Never used  Substance and Sexual Activity   Alcohol use: No   Drug use: No   Sexual activity: Yes  Other Topics Concern   Not on file  Social History Narrative   Not on file   Social Determinants of Health   Financial Resource Strain: Not on file  Food Insecurity: Not on file  Transportation Needs: Not on file  Physical Activity: Not on file  Stress: Not on file  Social Connections: Not on file  Intimate Partner Violence: Not on file    Family History  Problem Relation Age of Onset   Cancer Mother        Breast   Dementia Mother    Cancer Father        Lung Cancer, deceased at 79   Colon cancer Neg Hx    Liver disease Neg Hx     Anti-infectives: Anti-infectives (From admission, onward)    None       Current  Outpatient Medications  Medication Sig Dispense Refill   amLODipine (NORVASC) 2.5 MG tablet Take 1 tablet (2.5 mg total) by mouth daily. 90 tablet 3   calcium citrate (CALCITRATE - DOSED IN MG ELEMENTAL CALCIUM) 950 (200 Ca) MG tablet Take 1,200 mg of elemental calcium by mouth daily. Otc for calcium     diltiazem (TIAZAC) 300 MG 24 hr capsule Take 1 capsule (300 mg total) by mouth daily. 90 capsule 3   ezetimibe-simvastatin (VYTORIN) 10-10 MG tablet Take 1 tablet by mouth at bedtime for cholesterol 90 tablet 3   famotidine (PEPCID) 40 MG tablet Take 1 tablet (40 mg total) by mouth daily. 30 tablet 5   lidocaine (LIDODERM) 5 % Place 1 patch onto the skin daily. Remove & Discard patch within 12 hours or as directed by MD 30 patch 0   Multiple Vitamins-Minerals (ALIVE WOMENS 50+) TABS Take 1 tablet by mouth daily.     olmesartan-hydrochlorothiazide (BENICAR HCT)  40-25 MG tablet TAKE 1 TABLET BY MOUTH DAILY 90 tablet 3   pantoprazole (PROTONIX) 40 MG tablet Take 1 tablet (40 mg total) by mouth daily. 90 tablet 3   potassium chloride SA (KLOR-CON M) 20 MEQ tablet TAKE 1 TABLET(20 MEQ) BY MOUTH TWICE DAILY 180 tablet 3   sucralfate (CARAFATE) 1 g tablet Take 1 tablet (1 g total) by mouth 4 (four) times daily -  with meals and at bedtime. 120 tablet 0   No current facility-administered medications for this visit.     Objective: Vital signs in last 24 hours: BP (!) 157/83   Pulse (!) 108   Ht 5\' 3"  (1.6 m)   Wt 185 lb 9.6 oz (84.2 kg)   BMI 32.88 kg/m   Intake/Output from previous day: No intake/output data recorded. Intake/Output this shift: @IOTHISSHIFT @   Physical Exam Vitals reviewed.  Constitutional:      Appearance: Normal appearance.  Cardiovascular:     Rate and Rhythm: Normal rate and regular rhythm.     Pulses: Normal pulses.  Pulmonary:     Effort: Pulmonary effort is normal. No respiratory distress.     Breath sounds: Normal breath sounds.  Abdominal:     General: Abdomen is flat.     Palpations: Abdomen is soft.     Tenderness: There is no abdominal tenderness.  Musculoskeletal:        General: No swelling. Normal range of motion.  Skin:    General: Skin is warm and dry.  Neurological:     General: No focal deficit present.     Mental Status: She is alert and oriented to person, place, and time.  Psychiatric:        Mood and Affect: Mood normal.        Behavior: Behavior normal.     Lab Results:  Results for orders placed or performed in visit on 11/02/22 (from the past 24 hour(s))  Urinalysis, Routine w reflex microscopic     Status: Abnormal   Collection Time: 11/02/22 10:01 AM  Result Value Ref Range   Specific Gravity, UA 1.015 1.005 - 1.030   pH, UA 7.0 5.0 - 7.5   Color, UA Yellow Yellow   Appearance Ur Clear Clear   Leukocytes,UA Negative Negative   Protein,UA Negative Negative/Trace   Glucose, UA  Negative Negative   Ketones, UA Negative Negative   RBC, UA Trace (A) Negative   Bilirubin, UA Negative Negative   Urobilinogen, Ur 0.2 0.2 - 1.0 mg/dL   Nitrite, UA Negative  Negative   Microscopic Examination See below:    Narrative   Performed at:  [address] - [facility] [address], [place], [place]  [id number] Lab Director: Chinita Pester MT, Phone:  [phone]  Microscopic Examination     Status: Abnormal   Collection Time: 11/02/22 10:01 AM   Urine  Result Value Ref Range   WBC, UA 0-5 0 - 5 /hpf   RBC, Urine 3-10 (A) 0 - 2 /hpf   Epithelial Cells (non renal) >10 (A) 0 - 10 /hpf   Bacteria, UA Moderate (A) None seen/Few   Narrative   Performed at:  [address] - [facility]  [address], [place], [place]  [id number] Lab Director: Chinita Pester MT, Phone:  [phone]    BMET No results for input(s): "NA", "K", "CL", "CO2", "GLUCOSE", "BUN", "CREATININE", "CALCIUM" in the last 72 hours. PT/INR No results for input(s): "LABPROT", "INR" in the last 72 hours. ABG No results for input(s): "PHART", "HCO3" in the last 72 hours.  Invalid input(s): "PCO2", "PO2" UA has 3-10 RBC's Cr was 0.88 on 10/15/22.  Studies/Results: No results found. CT in 2019 was unremarkable.    Assessment/Plan: Microhematuria with some frequency.  I will get her set up for a CT hematuria study and then f/u for possible cystoscopy.   No orders of the defined types were placed in this encounter.    Orders Placed This Encounter  Procedures   Microscopic Examination   CT HEMATURIA WORKUP    Standing Status:   Future    Standing Expiration Date:   11/02/2023    Order Specific Question:   Reason for Exam (SYMPTOM  OR DIAGNOSIS REQUIRED)    Answer:   microhematuria    Order Specific Question:   Preferred imaging location?    Answer:   [facility]    Order Specific Question:   Radiology Contrast Protocol - do NOT remove file path    Answer:    [email or web address]   Urinalysis, Routine w reflex microscopic     Return for Next available cystoscopy.    CC: Dr. Syliva Overman.      Bjorn Pippin 11/03/2022

## 2022-11-10 ENCOUNTER — Ambulatory Visit: Payer: Medicare Other | Admitting: Urology

## 2022-11-13 ENCOUNTER — Telehealth: Payer: Self-pay

## 2022-11-13 NOTE — Telephone Encounter (Signed)
Patient called in today and left voice message that she has a right side pain and would like pain medication sent in to her pharmacy. Patient is aware a message will be sent to Dr. Wilson Singer for recommendation. Patient voiced understanding

## 2022-11-13 NOTE — Progress Notes (Unsigned)
Referring Provider: Kerri Perches, MD Primary Care Physician:  Kerri Perches, MD Primary GI Physician: Dr. Marletta Lor  No chief complaint on file.   HPI:   Kristin Hall is a 67 y.o. female with history of GERD, chronically elevated alkaline phosphatase with benign workup previously, HTN, HLD, thrombocytosis, presenting today for follow-up of atypical chest pain.   Last seen in the office 10/18/22 for ER follow-up of chest pain.  Chest pain was suspected to be multifactorial.  Suspected GERD flare she was reporting postprandial symptoms, but also possibly musculoskeletal as she had reproducible chest pain and reported working outside a bit picking up flowerpots prior to onset.  She had resolution of postprandial symptoms with the addition of Carafate and had no GI alarm symptoms.  Her residual chest pain was primarily left upper chest and was reproducible.  Denied exertional chest pain.  We discussed the possibility of an EGD, but patient requested to continue to monitor over the next few weeks.  Recommend continuing Protonix 40 mg daily, Carafate 4 times daily to complete a 30-day course, famotidine as needed, continue lidocaine patches and Tylenol as needed for MSK pain, follow-up in 4 weeks.  Today:     Past Medical History:  Diagnosis Date   Elevated alkaline phosphatase level    Headache    Hyperlipidemia    Hypertension    OSA on CPAP    Thrombocytosis 07/23/2015    Past Surgical History:  Procedure Laterality Date   ABDOMINAL HYSTERECTOMY     partial    COLONOSCOPY  10/04/2006   SLF: Normal retroflexed view of the rectum/Normal colon without evidence of polyps, masses, inflammatory changes, diverticula or arteriovenous malformations   COLONOSCOPY N/A 12/07/2016   Dr. Darrick Penna: 15 mm polyp in cecum, s/p APC and clips. One 30 mm polyp in sigmoid, s/p clip, injection, tattooed. Path with one adenoma and one hamartomatous polyp. Surveillance 2021.    COLONOSCOPY N/A  09/08/2019   Procedure: COLONOSCOPY;  Surgeon: West Bali, MD;  [facility];  Service: Endoscopy;  Laterality: N/A;  8:30AM   POLYPECTOMY  12/07/2016   Procedure: POLYPECTOMY;  Surgeon: West Bali, MD;  [facility];  Service: Endoscopy;;  cecal;   POLYPECTOMY  09/08/2019   Procedure: POLYPECTOMY;  Surgeon: West Bali, MD;  [facility];  Service: Endoscopy;;    Current Outpatient Medications  Medication Sig Dispense Refill   amLODipine (NORVASC) 2.5 MG tablet Take 1 tablet (2.5 mg total) by mouth daily. 90 tablet 3   calcium citrate (CALCITRATE - DOSED IN MG ELEMENTAL CALCIUM) 950 (200 Ca) MG tablet Take 1,200 mg of elemental calcium by mouth daily. Otc for calcium     diltiazem (TIAZAC) 300 MG 24 hr capsule Take 1 capsule (300 mg total) by mouth daily. 90 capsule 3   ezetimibe-simvastatin (VYTORIN) 10-10 MG tablet Take 1 tablet by mouth at bedtime for cholesterol 90 tablet 3   famotidine (PEPCID) 40 MG tablet Take 1 tablet (40 mg total) by mouth daily. 30 tablet 5   lidocaine (LIDODERM) 5 % Place 1 patch onto the skin daily. Remove & Discard patch within 12 hours or as directed by MD 30 patch 0   Multiple Vitamins-Minerals (ALIVE WOMENS 50+) TABS Take 1 tablet by mouth daily.     olmesartan-hydrochlorothiazide (BENICAR HCT) 40-25 MG tablet TAKE 1 TABLET BY MOUTH DAILY 90 tablet 3   pantoprazole (PROTONIX) 40 MG tablet Take 1 tablet (40 mg  total) by mouth daily. 90 tablet 3   potassium chloride SA (KLOR-CON M) 20 MEQ tablet TAKE 1 TABLET(20 MEQ) BY MOUTH TWICE DAILY 180 tablet 3   sucralfate (CARAFATE) 1 g tablet Take 1 tablet (1 g total) by mouth 4 (four) times daily -  with meals and at bedtime. 120 tablet 0   No current facility-administered medications for this visit.    Allergies as of 11/15/2022 - Review Complete 11/02/2022  Allergen Reaction Noted   Aspirin Other (See Comments) 09/22/2013   Metronidazole Hives and Itching 11/12/2007    Valsartan Cough 04/19/2015    Family History  Problem Relation Age of Onset   Cancer Mother        Breast   Dementia Mother    Cancer Father        Lung Cancer, deceased at 28   Colon cancer Neg Hx    Liver disease Neg Hx     Social History   Socioeconomic History   Marital status: Widowed    Spouse name: Not on file   Number of children: Not on file   Years of education: Not on file   Highest education level: Not on file  Occupational History   Not on file  Tobacco Use   Smoking status: Never   Smokeless tobacco: Never  Vaping Use   Vaping Use: Never used  Substance and Sexual Activity   Alcohol use: No   Drug use: No   Sexual activity: Yes  Other Topics Concern   Not on file  Social History Narrative   Not on file   Social Determinants of Health   Financial Resource Strain: Not on file  Food Insecurity: Not on file  Transportation Needs: Not on file  Physical Activity: Not on file  Stress: Not on file  Social Connections: Not on file    Review of Systems: Gen: Denies fever, chills, anorexia. Denies fatigue, weakness, weight loss.  CV: Denies chest pain, palpitations, syncope, peripheral edema, and claudication. Resp: Denies dyspnea at rest, cough, wheezing, coughing up blood, and pleurisy. GI: Denies vomiting blood, jaundice, and fecal incontinence.   Denies dysphagia or odynophagia. Derm: Denies rash, itching, dry skin Psych: Denies depression, anxiety, memory loss, confusion. No homicidal or suicidal ideation.  Heme: Denies bruising, bleeding, and enlarged lymph nodes.  Physical Exam: There were no vitals taken for this visit. General:   Alert and oriented. No distress noted. Pleasant and cooperative.  Head:  Normocephalic and atraumatic. Eyes:  Conjuctiva clear without scleral icterus. Heart:  S1, S2 present without murmurs appreciated. Lungs:  Clear to auscultation bilaterally. No wheezes, rales, or rhonchi. No distress.  Abdomen:  +BS, soft,  non-tender and non-distended. No rebound or guarding. No HSM or masses noted. Msk:  Symmetrical without gross deformities. Normal posture. Extremities:  Without edema. Neurologic:  Alert and  oriented x4 Psych:  Normal mood and affect.    Assessment:     Plan:  ***   Ermalinda Memos, PA-C [facility] 11/15/2022

## 2022-11-15 ENCOUNTER — Encounter: Payer: Self-pay | Admitting: Gastroenterology

## 2022-11-15 ENCOUNTER — Ambulatory Visit: Payer: Medicare Other | Admitting: Gastroenterology

## 2022-11-15 ENCOUNTER — Telehealth: Payer: Medicare Other | Admitting: Physician Assistant

## 2022-11-15 VITALS — BP 117/81 | HR 86 | Temp 97.6°F | Ht 63.0 in | Wt 185.8 lb

## 2022-11-15 DIAGNOSIS — K219 Gastro-esophageal reflux disease without esophagitis: Secondary | ICD-10-CM

## 2022-11-15 DIAGNOSIS — R079 Chest pain, unspecified: Secondary | ICD-10-CM

## 2022-11-15 DIAGNOSIS — N390 Urinary tract infection, site not specified: Secondary | ICD-10-CM

## 2022-11-15 NOTE — Telephone Encounter (Signed)
Patient left a voice message 11-15-2022  Checking on status of pain medication.  Please advise.

## 2022-11-15 NOTE — Patient Instructions (Signed)
I am glad you are feeling much better!  Continue to take your pantoprazole once daily to control your acid reflux.  Continue avoiding spicy foods that trigger your reflux.  Will plan to see you back in 6 months or sooner if needed.  It was great to see you again today!  Ermalinda Memos, PA-C [facility]

## 2022-11-15 NOTE — Progress Notes (Signed)
Because of back and belly pain with this and need for urine culture, I feel your condition warrants further evaluation and I recommend that you be seen in a face to face visit.   NOTE: There will be NO CHARGE for this eVisit   If you are having a true medical emergency please call 911.      For an urgent face to face visit, [facility] has eight urgent care centers for your convenience:   [facility] at [facility] Get Driving Directions [phone] [address] [place], [address]    [facility] Urgent Care Center at [facility] Get Driving Directions [phone] [address] [place], [place] [address]   [facility] [place]) Get Driving Directions [phone] [address] [place], [place] [address]  [facility] Health Urgent Care Center [facility]) Get Driving Directions [phone] [address] [place],  [place]  [address]  [facility] Health Urgent Care Center [facility] - at [employer]  [phone] [address] [place],  [place] [address]   [facility] Health Urgent Care at [facility] Get Driving Directions [phone] [address] [place], [place] [address]   [facility] Health Urgent Care at [facility] Get Driving Directions  [phone] [address] [place], [place] [address]   [facility] Health Urgent Care at [facility] Directions [phone] [address], [address] [place], [place] [address]  Your MyChart E-visit questionnaire answers were reviewed by a board certified advanced clinical practitioner to complete your personal care plan based on your specific symptoms.  Thank you for using e-Visits.

## 2022-11-20 NOTE — Telephone Encounter (Signed)
Please see patient request for pain medication.  

## 2022-11-24 ENCOUNTER — Ambulatory Visit
Admission: RE | Admit: 2022-11-24 | Discharge: 2022-11-24 | Disposition: A | Discharge status: Home / Self Care | Payer: Medicare Other | Source: Ambulatory Visit | Attending: Urology | Admitting: Urology

## 2022-11-24 DIAGNOSIS — R160 Hepatomegaly, not elsewhere classified: Secondary | ICD-10-CM | POA: Diagnosis not present

## 2022-11-24 DIAGNOSIS — N289 Disorder of kidney and ureter, unspecified: Secondary | ICD-10-CM | POA: Diagnosis not present

## 2022-11-24 DIAGNOSIS — R3129 Other microscopic hematuria: Secondary | ICD-10-CM | POA: Insufficient documentation

## 2022-11-24 DIAGNOSIS — K449 Diaphragmatic hernia without obstruction or gangrene: Secondary | ICD-10-CM | POA: Diagnosis not present

## 2022-11-24 MED ORDER — IOHEXOL 300 MG/ML  SOLN
100.0000 mL | Freq: Once | INTRAMUSCULAR | Status: AC | PRN
  Administered 2022-11-24: 100 mL via INTRAVENOUS

## 2022-11-26 ENCOUNTER — Other Ambulatory Visit: Payer: Self-pay | Admitting: Gastroenterology

## 2022-11-26 DIAGNOSIS — R079 Chest pain, unspecified: Secondary | ICD-10-CM

## 2022-11-26 DIAGNOSIS — K219 Gastro-esophageal reflux disease without esophagitis: Secondary | ICD-10-CM

## 2022-11-27 NOTE — Telephone Encounter (Signed)
Okay, Ct completed 07/12  Read not available yet

## 2022-12-04 ENCOUNTER — Encounter: Payer: Self-pay | Admitting: Internal Medicine

## 2022-12-04 ENCOUNTER — Telehealth: Payer: Self-pay | Admitting: Family Medicine

## 2022-12-04 ENCOUNTER — Telehealth (INDEPENDENT_AMBULATORY_CARE_PROVIDER_SITE_OTHER): Payer: Medicare Other | Admitting: Internal Medicine

## 2022-12-04 DIAGNOSIS — I10 Essential (primary) hypertension: Secondary | ICD-10-CM | POA: Diagnosis not present

## 2022-12-04 DIAGNOSIS — U071 COVID-19: Secondary | ICD-10-CM | POA: Insufficient documentation

## 2022-12-04 NOTE — Telephone Encounter (Signed)
error 

## 2022-12-04 NOTE — Progress Notes (Unsigned)
Virtual Visit via Video Note   Because of Kahli B Lagares's co-morbid illnesses, she is at least at moderate risk for complications without adequate follow up.  This format is felt to be most appropriate for this patient at this time.  All issues noted in this document were discussed and addressed.  A limited physical exam was performed with this format.      Evaluation Performed:  Follow-up visit  Date:  12/04/2022   ID:  Kristin Hall, DOB Aug 19, 1955, MRN [id number]  Patient Location: Home Provider Location: Office/Clinic  Participants: Patient Location of Patient: Home Location of Provider: Telehealth Consent was obtain for visit to be over via telehealth. I verified that I am speaking with the correct person using two identifiers.  PCP:  Kerri Perches, MD   Chief Complaint: Headache/COVID  History of Present Illness:    Kristin Hall is a 67 y.o. female who has a video visit for c/o headache for the last 1 week.  She tested positive for COVID at home about 6 days ago.  She denies any fever or chills.  Denies any dyspnea or wheezing currently.  She has generalized headache and dizziness currently.  Denies any visual disturbance currently.  She has been taking ibuprofen for headache.  Has not Paxlovid.  The patient {does/does not:200015} have symptoms concerning for COVID-19 infection (fever, chills, cough, or new shortness of breath).   Past Medical, Surgical, Social History, Allergies, and Medications have been Reviewed.  Past Medical History:  Diagnosis Date   Elevated alkaline phosphatase level    Headache    Hyperlipidemia    Hypertension    OSA on CPAP    Thrombocytosis 07/23/2015   Past Surgical History:  Procedure Laterality Date   ABDOMINAL HYSTERECTOMY     partial    COLONOSCOPY  10/04/2006   SLF: Normal retroflexed view of the rectum/Normal colon without evidence of polyps, masses, inflammatory changes, diverticula or arteriovenous  malformations   COLONOSCOPY N/A 12/07/2016   Dr. Darrick Penna: 15 mm polyp in cecum, s/p APC and clips. One 30 mm polyp in sigmoid, s/p clip, injection, tattooed. Path with one adenoma and one hamartomatous polyp. Surveillance 2021.    COLONOSCOPY N/A 09/08/2019   Procedure: COLONOSCOPY;  Surgeon: West Bali, MD;  [facility];  Service: Endoscopy;  Laterality: N/A;  8:30AM   POLYPECTOMY  12/07/2016   Procedure: POLYPECTOMY;  Surgeon: West Bali, MD;  [facility];  Service: Endoscopy;;  cecal;   POLYPECTOMY  09/08/2019   Procedure: POLYPECTOMY;  Surgeon: West Bali, MD;  [facility];  Service: Endoscopy;;     No outpatient medications have been marked as taking for the 12/04/22 encounter (Video Visit) with Anabel Halon, MD.     Allergies:   Aspirin, Metronidazole, and Valsartan   ROS:   Please see the history of present illness.    *** All other systems reviewed and are negative.   Labs/Other Tests and Data Reviewed:    Recent Labs: 08/18/2022: TSH 3.040 10/15/2022: ALT 23; BUN 8; Creatinine, Ser 0.88; Hemoglobin 13.4; Platelets 424; Potassium 3.8; Sodium 139   Recent Lipid Panel Lab Results  Component Value Date/Time   CHOL 172 08/18/2022 09:41 AM   TRIG 109 08/18/2022 09:41 AM   HDL 45 08/18/2022 09:41 AM   CHOLHDL 3.8 08/18/2022 09:41 AM   CHOLHDL 3.9 04/25/2019 09:54 AM   LDLCALC 107 (H) 08/18/2022 09:41 AM   LDLCALC 99  02/15/2018 10:13 AM    Wt Readings from Last 3 Encounters:  11/15/22 185 lb 12.8 oz (84.3 kg)  11/02/22 185 lb 9.6 oz (84.2 kg)  10/18/22 185 lb 9.6 oz (84.2 kg)     Objective:    Vital Signs:  There were no vitals taken for this visit.   {[facility] Virtual Exam (Optional):[phone]::"VITAL SIGNS:  reviewed"}  ASSESSMENT & PLAN:     I discussed the assessment and treatment plan with the patient. The patient was provided an opportunity to ask questions, and all were answered. The patient  agreed with the plan and demonstrated an understanding of the instructions.   The patient was advised to call back or seek an in-person evaluation if the symptoms worsen or if the condition fails to improve as anticipated.  The above assessment and management plan was discussed with the patient. The patient verbalized understanding of and has agreed to the management plan.   Medication Adjustments/Labs and Tests Ordered: Current medicines are reviewed at length with the patient today.  Concerns regarding medicines are outlined above.   Tests Ordered: No orders of the defined types were placed in this encounter.   Medication Changes: No orders of the defined types were placed in this encounter.    Note: This dictation was prepared with Dragon dictation along with smaller phrase technology. Similar sounding words can be transcribed inadequately or may not be corrected upon review. Any transcriptional errors that result from this process are unintentional.      Disposition:  Follow up  Signed, Anabel Halon, MD  12/04/2022 3:51 PM     [facility]

## 2022-12-04 NOTE — Patient Instructions (Signed)
Please take 2 tablets of Amlodipine once daily for now.  Please maintain at least 64 ounces of fluid intake in a day.  You can come out of quarantine now as it has been more than 5 days since you tested positive.

## 2022-12-05 ENCOUNTER — Encounter: Payer: Self-pay | Admitting: Internal Medicine

## 2022-12-05 NOTE — Assessment & Plan Note (Signed)
Had tested positive about 6 days ago, outside window period Of Paxlovid She still has headache and dizziness, could be due to uncontrolled HTN in the setting of decongestant/antitussive and ibuprofen use

## 2022-12-05 NOTE — Assessment & Plan Note (Signed)
BP: 160/82 today at home Uncontrolled with olmesartan-HCTZ 40-25 mg QD and amlodipine 2.5 mg QD BP uncontrolled, likely due to use of ibuprofen and decongestant-could be contributing to headache Advised to take amlodipine 2 tablets QD and f/u after 1 week Counseled for compliance with the medications Advised DASH diet and moderate exercise/walking, at least 150 mins/week

## 2022-12-11 ENCOUNTER — Ambulatory Visit: Payer: Medicare Other | Admitting: Internal Medicine

## 2022-12-28 ENCOUNTER — Other Ambulatory Visit: Payer: Medicare Other | Admitting: Urology

## 2023-01-24 ENCOUNTER — Other Ambulatory Visit: Payer: Self-pay | Admitting: Family Medicine

## 2023-02-05 ENCOUNTER — Ambulatory Visit (HOSPITAL_COMMUNITY)
Admission: RE | Admit: 2023-02-05 | Discharge: 2023-02-05 | Disposition: A | Discharge status: Home / Self Care | Payer: Medicare Other | Source: Ambulatory Visit | Attending: Family Medicine | Admitting: Family Medicine

## 2023-02-05 DIAGNOSIS — Z1231 Encounter for screening mammogram for malignant neoplasm of breast: Secondary | ICD-10-CM | POA: Insufficient documentation

## 2023-02-07 ENCOUNTER — Ambulatory Visit: Payer: Medicare Other

## 2023-02-07 VITALS — Ht 63.0 in | Wt 182.0 lb

## 2023-02-07 DIAGNOSIS — Z0000 Encounter for general adult medical examination without abnormal findings: Secondary | ICD-10-CM

## 2023-02-07 NOTE — Patient Instructions (Signed)
Kristin Hall , Thank you for taking time to come for your Medicare Wellness Visit. I appreciate your ongoing commitment to your health goals. Please review the following plan we discussed and let me know if I can assist you in the future.   Referrals/Orders/Follow-Ups/Clinician Recommendations: Aim for 30 minutes of exercise or brisk walking, 6-8 glasses of water, and 5 servings of fruits and vegetables each day.   This is a list of the screening recommended for you and due dates:  Health Maintenance  Topic Date Due   Flu Shot  12/14/2022   COVID-19 Vaccine (5 - 2023-24 season) 01/14/2023   Colon Cancer Screening  09/08/2023   Medicare Annual Wellness Visit  02/07/2024   Mammogram  02/04/2025   DTaP/Tdap/Td vaccine (3 - Td or Tdap) 10/03/2026   Pneumonia Vaccine  Completed   DEXA scan (bone density measurement)  Completed   Hepatitis C Screening  Completed   Zoster (Shingles) Vaccine  Completed   HPV Vaccine  Aged Out    Advanced directives: (Copy Requested) Please bring a copy of your health care power of attorney and living will to the office to be added to your chart at your convenience.  Next Medicare Annual Wellness Visit scheduled for next year: Yes  Insert Preventive Care attachment Insert FALL PREVENTION attachment if needed

## 2023-02-07 NOTE — Progress Notes (Signed)
Subjective:   Kristin Hall is a 67 y.o. female who presents for Medicare Annual (Subsequent) preventive examination.  Visit Complete: Virtual  I connected with  Kristin Hall on 02/07/23 by a audio enabled telemedicine application and verified that I am speaking with the correct person using two identifiers.  Patient Location: Home  Provider Location: Home Office  I discussed the limitations of evaluation and management by telemedicine. The patient expressed understanding and agreed to proceed.  Patient Medicare AWV questionnaire was completed by the patient on 02/07/2023; I have confirmed that all information answered by patient is correct and no changes since this date.  Cardiac Risk Factors include: advanced age (>31men, >35 women);dyslipidemia;hypertensionBecause this visit was a virtual/telehealth visit, some criteria may be missing or patient reported. Any vitals not documented were not able to be obtained and vitals that have been documented are patient reported.       Objective:    Today's Vitals   02/07/23 1005  Weight: 182 lb (82.6 kg)  Height: 5\' 3"  (1.6 m)   Body mass index is 32.24 kg/m.     02/07/2023   10:08 AM 10/15/2022    8:05 AM 09/08/2019    7:36 AM 12/07/2016    7:26 AM 06/18/2016   12:15 PM 05/01/2016    1:43 PM 12/09/2015    9:19 AM  Advanced Directives  Does Patient Have a Medical Advance Directive? No No No No No No No  Would patient like information on creating a medical advance directive? Yes (MAU/Ambulatory/Procedural Areas - Information given) No - Patient declined No - Patient declined No - Patient declined No - Patient declined No - Patient declined     Current Medications (verified) Outpatient Encounter Medications as of 02/07/2023  Medication Sig   amLODipine (NORVASC) 2.5 MG tablet Take 1 tablet (2.5 mg total) by mouth daily.   calcium citrate (CALCITRATE - DOSED IN MG ELEMENTAL CALCIUM) 950 (200 Ca) MG tablet Take 1,200 mg of elemental  calcium by mouth daily. Otc for calcium   diltiazem (TIAZAC) 300 MG 24 hr capsule Take 1 capsule (300 mg total) by mouth daily.   ezetimibe-simvastatin (VYTORIN) 10-10 MG tablet TAKE 1 TABLET BY MOUTH AT BEDTIME FOR CHOLESTEROL   Multiple Vitamins-Minerals (ALIVE WOMENS 50+) TABS Take 1 tablet by mouth daily.   olmesartan-hydrochlorothiazide (BENICAR HCT) 40-25 MG tablet TAKE 1 TABLET BY MOUTH DAILY   pantoprazole (PROTONIX) 40 MG tablet Take 1 tablet (40 mg total) by mouth daily.   potassium chloride SA (KLOR-CON M) 20 MEQ tablet TAKE 1 TABLET(20 MEQ) BY MOUTH TWICE DAILY   lidocaine (LIDODERM) 5 % Place 1 patch onto the skin daily. Remove & Discard patch within 12 hours or as directed by MD   No facility-administered encounter medications on file as of 02/07/2023.    Allergies (verified) Aspirin, Metronidazole, and Valsartan   History: Past Medical History:  Diagnosis Date   Elevated alkaline phosphatase level    Headache    Hyperlipidemia    Hypertension    OSA on CPAP    Thrombocytosis 07/23/2015   Past Surgical History:  Procedure Laterality Date   ABDOMINAL HYSTERECTOMY     partial    COLONOSCOPY  10/04/2006   SLF: Normal retroflexed view of the rectum/Normal colon without evidence of polyps, masses, inflammatory changes, diverticula or arteriovenous malformations   COLONOSCOPY N/A 12/07/2016   Dr. Darrick Penna: 15 mm polyp in cecum, s/p APC and clips. One 30 mm polyp in sigmoid, s/p clip, injection,  tattooed. Path with one adenoma and one hamartomatous polyp. Surveillance 2021.    COLONOSCOPY N/A 09/08/2019   Procedure: COLONOSCOPY;  Surgeon: West Bali, MD;  [facility];  Service: Endoscopy;  Laterality: N/A;  8:30AM   POLYPECTOMY  12/07/2016   Procedure: POLYPECTOMY;  Surgeon: West Bali, MD;  [facility];  Service: Endoscopy;;  cecal;   POLYPECTOMY  09/08/2019   Procedure: POLYPECTOMY;  Surgeon: West Bali, MD;  [facility];   Service: Endoscopy;;   Family History  Problem Relation Age of Onset   Cancer Mother        Breast   Dementia Mother    Cancer Father        Lung Cancer, deceased at 28   Colon cancer Neg Hx    Liver disease Neg Hx    Social History   Socioeconomic History   Marital status: Widowed    Spouse name: Not on file   Number of children: Not on file   Years of education: Not on file   Highest education level: Not on file  Occupational History   Not on file  Tobacco Use   Smoking status: Never   Smokeless tobacco: Never  Vaping Use   Vaping status: Never Used  Substance and Sexual Activity   Alcohol use: No   Drug use: No   Sexual activity: Yes  Other Topics Concern   Not on file  Social History Narrative   Not on file   Social Determinants of Health   Financial Resource Strain: Low Risk  (02/07/2023)   Overall Financial Resource Strain (CARDIA)    Difficulty of Paying Living Expenses: Not hard at all  Food Insecurity: No Food Insecurity (02/07/2023)   Hunger Vital Sign    Worried About Running Out of Food in the Last Year: Never true    Ran Out of Food in the Last Year: Never true  Transportation Needs: No Transportation Needs (02/07/2023)   PRAPARE - Administrator, Civil Service (Medical): No    Lack of Transportation (Non-Medical): No  Physical Activity: Insufficiently Active (02/07/2023)   Exercise Vital Sign    Days of Exercise per Week: 3 days    Minutes of Exercise per Session: 30 min  Stress: No Stress Concern Present (02/07/2023)   [employer] of Occupational Health - Occupational Stress Questionnaire    Feeling of Stress : Not at all  Social Connections: Moderately Isolated (02/07/2023)   Social Connection and Isolation Panel [NHANES]    Frequency of Communication with Friends and Family: More than three times a week    Frequency of Social Gatherings with Friends and Family: More than three times a week    Attends Religious Services: More than  4 times per year    Active Member of [employer] or Organizations: No    Attends Banker Meetings: Never    Marital Status: Widowed    Tobacco Counseling Counseling given: Not Answered   Clinical Intake:  Pre-visit preparation completed: Yes  Pain : No/denies pain     Nutritional Risks: None Diabetes: No  How often do you need to have someone help you when you read instructions, pamphlets, or other written materials from your doctor or pharmacy?: 1 - Never  Interpreter Needed?: No  Information entered by :: Renie Ora, LPN   Activities of Daily Living    02/07/2023   10:09 AM 02/04/2023    4:57 PM  In your  present state of health, do you have any difficulty performing the following activities:  Hearing? 0 0  Vision? 0 0  Difficulty concentrating or making decisions? 0 0  Walking or climbing stairs? 0 0  Dressing or bathing? 0 0  Doing errands, shopping? 0 0  Preparing Food and eating ? N N  Using the Toilet? N N  In the past six months, have you accidently leaked urine? N N  Do you have problems with loss of bowel control? N N  Managing your Medications? N N  Managing your Finances? N N  Housekeeping or managing your Housekeeping? N N    Patient Care Team: Kerri Perches, MD as PCP - [facility], Darleene Cleaver, MD (Inactive) as Attending Physician (Gastroenterology)  Indicate any recent Medical Services you may have received from other than [facility] providers in the past year (date may be approximate).     Assessment:   This is a routine wellness examination for Keith.  Hearing/Vision screen Vision Screening - Comments:: Wears rx glasses - up to date with routine eye exams with  Dr.Cotter    Goals Addressed             This Visit's Progress    Exercise 3x per week (30 min per time)         Depression Screen    02/07/2023   10:07 AM 12/04/2022    1:33 PM 08/18/2022    9:06 AM 07/07/2022   11:02 AM 02/16/2022    9:03 AM 09/14/2021    3:54  PM 02/11/2021    9:51 AM  PHQ 2/9 Scores  PHQ - 2 Score 0 0 0 0 0 0 0  PHQ- 9 Score       0    Fall Risk    02/07/2023   10:05 AM 02/04/2023    4:57 PM 12/04/2022    1:33 PM 08/18/2022    9:06 AM 07/07/2022   11:02 AM  Fall Risk   Falls in the past year? 0 1 0 0 0  Number falls in past yr: 0 0 0 0 0  Injury with Fall? 0 0 0 0 0  Risk for fall due to : No Fall Risks   No Fall Risks   Follow up Falls prevention discussed   Falls evaluation completed     MEDICARE RISK AT HOME: Medicare Risk at Home Any stairs in or around the home?: No If so, are there any without handrails?: No Home free of loose throw rugs in walkways, pet beds, electrical cords, etc?: Yes Adequate lighting in your home to reduce risk of falls?: Yes Life alert?: No Use of a cane, walker or w/c?: No Grab bars in the bathroom?: Yes Shower chair or bench in shower?: Yes Elevated toilet seat or a handicapped toilet?: Yes  TIMED UP AND GO:  Was the test performed?  No    Cognitive Function:        02/07/2023   10:09 AM  6CIT Screen  What Year? 0 points  What month? 0 points  What time? 0 points  Count back from 20 0 points  Months in reverse 0 points  Repeat phrase 0 points  Total Score 0 points    Immunizations Immunization History  Administered Date(s) Administered   Fluad Quad(high Dose 65+) 02/08/2022   Influenza Split 02/09/2014   Influenza Whole 06/05/2007   Influenza,inj,Quad PF,6+ Mos 02/18/2018, 01/14/2020, 02/11/2021   Influenza-Unspecified 02/02/2017   Moderna SARS-COV2 Booster Vaccination 02/08/2022  Moderna Sars-Covid-2 Vaccination 06/07/2019, 07/10/2019, 03/25/2020   PNEUMOCOCCAL CONJUGATE-20 12/20/2021   Td 08/15/2006   Tdap 10/02/2016   Zoster Recombinant(Shingrix) 02/19/2017, 10/29/2017    TDAP status: Up to date  Flu Vaccine status: Up to date  Pneumococcal vaccine status: Up to date  Covid-19 vaccine status: Completed vaccines  Qualifies for Shingles Vaccine? Yes    Zostavax completed Yes   Shingrix Completed?: Yes  Screening Tests Health Maintenance  Topic Date Due   INFLUENZA VACCINE  12/14/2022   COVID-19 Vaccine (5 - 2023-24 season) 01/14/2023   Colonoscopy  09/08/2023   Medicare Annual Wellness (AWV)  02/07/2024   MAMMOGRAM  02/04/2025   DTaP/Tdap/Td (3 - Td or Tdap) 10/03/2026   Pneumonia Vaccine 28+ Years old  Completed   DEXA SCAN  Completed   Hepatitis C Screening  Completed   Zoster Vaccines- Shingrix  Completed   HPV VACCINES  Aged Out    Health Maintenance  Health Maintenance Due  Topic Date Due   INFLUENZA VACCINE  12/14/2022   COVID-19 Vaccine (5 - 2023-24 season) 01/14/2023    Colorectal cancer screening: Type of screening: Colonoscopy. Completed 09/08/2019. Repeat every 3 years  Mammogram status: Completed 02/05/2023. Repeat every year  Bone Density status: Completed 02/01/2022. Results reflect: Bone density results: OSTEOPENIA. Repeat every 5 years.  Lung Cancer Screening: (Low Dose CT Chest recommended if Age 58-80 years, 20 pack-year currently smoking OR have quit w/in 15years.) does not qualify.   Lung Cancer Screening Referral: n/a  Additional Screening:  Hepatitis C Screening: does not qualify; Completed 05/28/2015  Vision Screening: Recommended annual ophthalmology exams for early detection of glaucoma and other disorders of the eye. Is the patient up to date with their annual eye exam?  Yes  Who is the provider or what is the name of the office in which the patient attends annual eye exams? Dr.Cotter  If pt is not established with a provider, would they like to be referred to a provider to establish care? No .   Dental Screening: Recommended annual dental exams for proper oral hygiene   Community Resource Referral / Chronic Care Management: CRR required this visit?  No   CCM required this visit?  No     Plan:     I have personally reviewed and noted the following in the patient's chart:    Medical and social history Use of alcohol, tobacco or illicit drugs  Current medications and supplements including opioid prescriptions. Patient is not currently taking opioid prescriptions. Functional ability and status Nutritional status Physical activity Advanced directives List of other physicians Hospitalizations, surgeries, and ER visits in previous 12 months Vitals Screenings to include cognitive, depression, and falls Referrals and appointments  In addition, I have reviewed and discussed with patient certain preventive protocols, quality metrics, and best practice recommendations. A written personalized care plan for preventive services as well as general preventive health recommendations were provided to patient.     Lorrene Reid, LPN   [id number]   After Visit Summary: (MyChart) Due to this being a telephonic visit, the after visit summary with patients personalized plan was offered to patient via MyChart   Nurse Notes: none

## 2023-02-21 ENCOUNTER — Ambulatory Visit (INDEPENDENT_AMBULATORY_CARE_PROVIDER_SITE_OTHER): Payer: Medicare Other | Admitting: Family Medicine

## 2023-02-21 ENCOUNTER — Encounter: Payer: Self-pay | Admitting: Family Medicine

## 2023-02-21 VITALS — BP 129/78 | HR 88 | Ht 63.0 in | Wt 184.0 lb

## 2023-02-21 DIAGNOSIS — E66811 Obesity, class 1: Secondary | ICD-10-CM

## 2023-02-21 DIAGNOSIS — G4733 Obstructive sleep apnea (adult) (pediatric): Secondary | ICD-10-CM | POA: Diagnosis not present

## 2023-02-21 DIAGNOSIS — Z0001 Encounter for general adult medical examination with abnormal findings: Secondary | ICD-10-CM

## 2023-02-21 DIAGNOSIS — Z860100 Personal history of colon polyps, unspecified: Secondary | ICD-10-CM

## 2023-02-21 DIAGNOSIS — E785 Hyperlipidemia, unspecified: Secondary | ICD-10-CM | POA: Diagnosis not present

## 2023-02-21 DIAGNOSIS — I10 Essential (primary) hypertension: Secondary | ICD-10-CM

## 2023-02-21 NOTE — Patient Instructions (Signed)
F/u in 6 months, call if you ned me sooner  Fasting lipid, cmp and EGFR and hBA1C today  Best for the rest of 2024 and 2025  You are  up to date with all screening and immunization  Great you are ready to exercise  You are referred to Dr Sherene Sires for OSA management  Next colonoscopy is due in 2026  Please get appt for eye exam as you report being monitored for glaucoma  Thanks for choosing [facility], we consider it a privelige to serve you.

## 2023-02-22 LAB — CMP14+EGFR
ALT: 16 [IU]/L (ref 0–32)
AST: 21 [IU]/L (ref 0–40)
Albumin: 4.4 g/dL (ref 3.9–4.9)
Alkaline Phosphatase: 158 [IU]/L — ABNORMAL HIGH (ref 44–121)
BUN/Creatinine Ratio: 13 (ref 12–28)
BUN: 11 mg/dL (ref 8–27)
Bilirubin Total: 0.5 mg/dL (ref 0.0–1.2)
CO2: 26 mmol/L (ref 20–29)
Calcium: 10.1 mg/dL (ref 8.7–10.3)
Chloride: 101 mmol/L (ref 96–106)
Creatinine, Ser: 0.87 mg/dL (ref 0.57–1.00)
Globulin, Total: 3.2 g/dL (ref 1.5–4.5)
Glucose: 96 mg/dL (ref 70–99)
Potassium: 4 mmol/L (ref 3.5–5.2)
Sodium: 141 mmol/L (ref 134–144)
Total Protein: 7.6 g/dL (ref 6.0–8.5)
eGFR: 73 mL/min/{1.73_m2} (ref >59)

## 2023-02-22 LAB — HEMOGLOBIN A1C
Est. average glucose Bld gHb Est-mCnc: 123 mg/dL
Hgb A1c MFr Bld: 5.9 % — ABNORMAL HIGH (ref 4.8–5.6)

## 2023-02-22 LAB — LIPID PANEL
Chol/HDL Ratio: 3.9 {ratio} (ref 0.0–4.4)
Cholesterol, Total: 163 mg/dL (ref 100–199)
HDL: 42 mg/dL (ref >39)
LDL Chol Calc (NIH): 94 mg/dL (ref 0–99)
Triglycerides: 156 mg/dL — ABNORMAL HIGH (ref 0–149)
VLDL Cholesterol Cal: 27 mg/dL (ref 5–40)

## 2023-02-25 ENCOUNTER — Encounter: Payer: Self-pay | Admitting: Family Medicine

## 2023-02-25 DIAGNOSIS — Z0001 Encounter for general adult medical examination with abnormal findings: Secondary | ICD-10-CM | POA: Insufficient documentation

## 2023-02-25 NOTE — Assessment & Plan Note (Signed)
Needs to establish with new Provider , refer to Dr Vassie Loll

## 2023-02-25 NOTE — Assessment & Plan Note (Signed)

## 2023-02-25 NOTE — Progress Notes (Signed)
Kristin Hall     MRN: [id number]      DOB: 1956-02-08  Chief Complaint  Patient presents with   Annual Exam    CPE     HPI: Patient is in for annual physical exam.no other health concerns are expressed or addressed at the visit. Recent labs,  are reviewed.and are excellent Immunization is reviewed , and  is up to date  PE: BP 129/78 (BP Location: Right Arm, Patient Position: Sitting, Cuff Size: Large)   Pulse 88   Ht 5\' 3"  (1.6 m)   Wt 184 lb 0.6 oz (83.5 kg)   SpO2 94%   BMI 32.60 kg/m   Pleasant  female, alert and oriented x 3, in no cardio-pulmonary distress. Afebrile. HEENT No facial trauma or asymetry. Sinuses non tender.  Extra occullar muscles intact.. External ears normal, . Neck: supple, no adenopathy,JVD or thyromegaly.No bruits.  Chest: Clear to ascultation bilaterally.No crackles or wheezes. Non tender to palpation    Cardiovascular system; Heart sounds normal,  S1 and  S2 ,no S3.  No murmur, or thrill. Apical beat not displaced Peripheral pulses normal.  Abdomen: Soft, non tender, no organomegaly or masses. No bruits. Bowel sounds normal. No guarding, tenderness or rebound.     Musculoskeletal exam: Full ROM of spine, hips , shoulders and knees. No deformity ,swelling or crepitus noted. No muscle wasting or atrophy.   Neurologic: Cranial nerves 2 to 12 intact. Power, tone ,sensation and reflexes normal throughout. No disturbance in gait. No tremor.  Skin: Intact, no ulceration, erythema , scaling or rash noted. Pigmentation normal throughout  Psych; Normal mood and affect. Judgement and concentration normal   Assessment & Plan:  OSA on CPAP Needs to establish with new Provider , refer to Dr Vassie Loll  Annual visit for general adult medical examination with abnormal findings Annual exam as documented. Counseling done  re healthy lifestyle involving commitment to 150 minutes exercise per week, heart healthy diet, and attaining  healthy weight.The importance of adequate sleep also discussed. Regular seat belt use and home safety, is also discussed. Changes in health habits are decided on by the patient with goals and time frames  set for achieving them. Immunization and cancer screening needs are specifically addressed at this visit.

## 2023-03-06 ENCOUNTER — Other Ambulatory Visit: Payer: Self-pay | Admitting: Family Medicine

## 2023-03-29 ENCOUNTER — Encounter: Payer: Self-pay | Admitting: Gastroenterology

## 2023-04-16 ENCOUNTER — Other Ambulatory Visit: Payer: Self-pay | Admitting: Family Medicine

## 2023-04-16 DIAGNOSIS — K219 Gastro-esophageal reflux disease without esophagitis: Secondary | ICD-10-CM

## 2023-04-18 ENCOUNTER — Ambulatory Visit (INDEPENDENT_AMBULATORY_CARE_PROVIDER_SITE_OTHER): Payer: Medicare Other | Admitting: Family Medicine

## 2023-04-18 ENCOUNTER — Encounter: Payer: Self-pay | Admitting: Family Medicine

## 2023-04-18 VITALS — BP 138/77 | HR 94 | Ht 63.0 in | Wt 182.1 lb

## 2023-04-18 DIAGNOSIS — M25862 Other specified joint disorders, left knee: Secondary | ICD-10-CM

## 2023-04-18 DIAGNOSIS — M5442 Lumbago with sciatica, left side: Secondary | ICD-10-CM | POA: Diagnosis not present

## 2023-04-18 DIAGNOSIS — I10 Essential (primary) hypertension: Secondary | ICD-10-CM

## 2023-04-18 MED ORDER — METHYLPREDNISOLONE ACETATE 80 MG/ML IJ SUSP
80.0000 mg | Freq: Once | INTRAMUSCULAR | Status: AC
Start: 2023-04-18 — End: 2023-04-18
  Administered 2023-04-18: 80 mg via INTRAMUSCULAR

## 2023-04-18 MED ORDER — KETOROLAC TROMETHAMINE 60 MG/2ML IM SOLN
60.0000 mg | Freq: Once | INTRAMUSCULAR | Status: AC
Start: 2023-04-18 — End: 2023-04-18
  Administered 2023-04-18: 60 mg via INTRAMUSCULAR

## 2023-04-18 MED ORDER — MELOXICAM 15 MG PO TABS: ORAL_TABLET | ORAL | 0 refills | Status: DC

## 2023-04-18 MED ORDER — PREDNISONE 20 MG PO TABS: 20.0000 mg | ORAL_TABLET | Freq: Two times a day (BID) | ORAL | 0 refills | Status: DC

## 2023-04-18 MED ORDER — GABAPENTIN 100 MG PO CAPS: ORAL_CAPSULE | ORAL | 2 refills | Status: DC

## 2023-04-18 NOTE — Assessment & Plan Note (Signed)
1 week h/o mobile mass left knee impression is cyst, refer Ortho for e/m

## 2023-04-18 NOTE — Progress Notes (Signed)
   Kristin Hall     MRN: [id number]      DOB: 1955/07/22  Chief Complaint  Patient presents with   Follow-up    Sciatic nerve pain x 1 week needs refill on gabapentin     HPI Kristin Hall is here woith a 10 pain from low back to left buttock and left posterior thigh to knee, aggravated by hanging decorartions 04/07/2023, Pain rated at 10, also  has tingling in toes and pain in feet. No red flags, no new numbness, weakness or incontinence, but easily provoked severe pain recurring ROS Denies recent fever or chills. Denies sinus pressure, nasal congestion, ear pain or sore throat. Denies chest congestion, productive cough or wheezing. Denies chest pains, palpitations and leg swelling Denies abdominal pain, nausea, vomiting,diarrhea or constipation.   Denies dysuria, frequency, hesitancy or incontinence. Denies depression, anxiety or insomnia. Denies skin break down or rash.   PE  BP 138/77 (BP Location: Right Arm, Patient Position: Sitting, Cuff Size: Large)   Pulse 94   Ht 5\' 3"  (1.6 m)   Wt 182 lb 1.9 oz (82.6 kg)   SpO2 94%   BMI 32.26 kg/m  Pt in pain Patient alert and oriented and in no cardiopulmonary distress.  HEENT: No facial asymmetry, EOMI,     Neck supple .  Chest: Clear to auscultation bilaterally.  CVS: S1, S2 no murmurs, no S3.Regular rate.  ABD: Soft non tender.   Ext: No edema  MS: markedly decreased  ROM lumbar spine, tender over left SI joint, normal in shoulders, hips and knees.  Skin: Intact, no ulcerations or rash noted. Non tender cystic mass underskin of left knee , anterior aspect  Psych: Good eye contact, normal affect. Memory intact not anxious or depressed appearing.  CNS: CN 2-12 intact, power,  normal throughout.no focal deficits noted.   Assessment & Plan  Low back pain with left-sided sciatica Uncontrolled.Toradol and depo medrol administered IM in the office , to be followed by a short course of oral prednisone and  NSAIDS.   Cyst of left knee joint 1 week h/o mobile mass left knee impression is cyst, refer Ortho for e/m  Essential hypertension Controlled, no change in medication

## 2023-04-18 NOTE — Patient Instructions (Signed)
Follow-up in 2 to 4 weeks, call if you need to be seen sooner.  X-ray of your low back today.  Toradol 60 mg IM and Depo-Medrol 80 mg IM in the office today for acute back pain.  Starting today please also take prednisone 1 tablet twice daily for the next 5 days.  Gabapentin is refilled.  New for acute flares of pain is meloxicam 15 mg 1 daily this will be dispensed in the next 2 weeks you do not take it with the prednisone.  I am concerned that the pain is flaring up with little aggravation so it is important that you call you keep your follow-up visit which is being scheduled sooner than before.  You are referred to Dr. Romeo Apple regarding the  nodule/ cyst  in  your left knee.  Thanks for choosing [facility], we consider it a privelige to serve you.

## 2023-04-18 NOTE — Assessment & Plan Note (Signed)
Uncontrolled.Toradol and depo medrol administered IM in the office , to be followed by a short course of oral prednisone and NSAIDS.  

## 2023-04-23 NOTE — Assessment & Plan Note (Signed)
Controlled, no change in medication  

## 2023-04-24 ENCOUNTER — Ambulatory Visit (HOSPITAL_COMMUNITY)
Admission: RE | Admit: 2023-04-24 | Discharge: 2023-04-24 | Disposition: A | Discharge status: Home / Self Care | Payer: Medicare Other | Source: Ambulatory Visit | Attending: Family Medicine | Admitting: Family Medicine

## 2023-04-24 DIAGNOSIS — M5442 Lumbago with sciatica, left side: Secondary | ICD-10-CM | POA: Insufficient documentation

## 2023-04-24 DIAGNOSIS — M47816 Spondylosis without myelopathy or radiculopathy, lumbar region: Secondary | ICD-10-CM | POA: Diagnosis not present

## 2023-04-24 DIAGNOSIS — M545 Low back pain, unspecified: Secondary | ICD-10-CM | POA: Diagnosis not present

## 2023-04-30 ENCOUNTER — Encounter: Payer: Self-pay | Admitting: Orthopedic Surgery

## 2023-04-30 ENCOUNTER — Other Ambulatory Visit (INDEPENDENT_AMBULATORY_CARE_PROVIDER_SITE_OTHER): Payer: Medicare Other

## 2023-04-30 ENCOUNTER — Ambulatory Visit: Payer: Medicare Other | Admitting: Orthopedic Surgery

## 2023-04-30 VITALS — BP 129/89 | HR 90 | Ht 63.0 in | Wt 180.0 lb

## 2023-04-30 DIAGNOSIS — M7989 Other specified soft tissue disorders: Secondary | ICD-10-CM

## 2023-04-30 DIAGNOSIS — D3613 Benign neoplasm of peripheral nerves and autonomic nervous system of lower limb, including hip: Secondary | ICD-10-CM

## 2023-04-30 NOTE — Progress Notes (Signed)
Office Visit Note   Patient: Kristin Hall           Date of Birth: 1956/01/08           MRN: [id number] Visit Date: 04/30/2023 Requested by: Kerri Perches, MD [address], [address] [place],  [place] [address] PCP: Kerri Perches, MD   Assessment & Plan:   Possible neurofibroma anteromedial left knee  Encounter Diagnoses  Name Primary?   Mass of soft tissue of knee Yes   Neurofibroma of lower extremity     No orders of the defined types were placed in this encounter.   Observation   Subjective: Chief Complaint  Patient presents with   Knee Pain    Left medial knee pain for a couple weeks states Dr Lodema Hong told her is a cyst in the knee     HPI: 67 year old female subcutaneous mass left knee thought to be a cyst causing mild discomfort              ROS: Negative   Images personally read and my interpretation : See report  Visit Diagnoses:  1. Mass of soft tissue of knee   2. Neurofibroma of lower extremity      Follow-Up Instructions: Return if symptoms worsen or fail to improve.    Objective: Vital Signs: BP 129/89   Pulse 90   Ht 5\' 3"  (1.6 m)   Wt 180 lb (81.6 kg)   BMI 31.89 kg/m   Physical Exam Vitals and nursing note reviewed.  Constitutional:      Appearance: Normal appearance.  HENT:     Head: Normocephalic and atraumatic.  Eyes:     General: No scleral icterus.       Right eye: No discharge.        Left eye: No discharge.     Extraocular Movements: Extraocular movements intact.     Conjunctiva/sclera: Conjunctivae normal.     Pupils: Pupils are equal, round, and reactive to light.  Cardiovascular:     Rate and Rhythm: Normal rate.     Pulses: Normal pulses.  Skin:    General: Skin is warm and dry.     Capillary Refill: Capillary refill takes less than 2 seconds.  Neurological:     General: No focal deficit present.     Mental Status: She is alert and oriented to person, place, and time.  Psychiatric:        Mood  and Affect: Mood normal.        Behavior: Behavior normal.        Thought Content: Thought content normal.        Judgment: Judgment normal.      Ortho Exam  Normal range of motion left knee normal exam, and the subcutaneous tissue anteromedial aspect mobile mass with minor tenderness when palpated  No effusion   Specialty Comments:  No specialty comments available.  Imaging: DG Knee AP/LAT W/Sunrise Left Result Date: 04/30/2023 Soft tissue mass left knee anteromedial aspect in the subcutaneous tissue Films show normal alignment no degenerative changes Impression normal x-ray left knee     PMFS History: Patient Active Problem List   Diagnosis Date Noted   Cyst of left knee joint 04/18/2023   Annual visit for general adult medical examination with abnormal findings 02/25/2023   Vitamin D deficiency 08/18/2022   Asymptomatic microscopic hematuria 08/18/2022   Low back pain with left-sided sciatica 07/08/2022   Pain and numbness of left upper extremity  07/08/2022   Skin lesion 09/18/2021   Diastolic dysfunction without heart failure 02/11/2021   Heart valve disorder 02/11/2021   History of colonic polyps    OSA on CPAP 02/18/2018   Thrombocytosis 07/23/2015   Chest pain 06/03/2015   Obesity (BMI 30.0-34.9) 01/01/2015   Elevated alkaline phosphatase level 04/21/2012   DYSKINESIA OF ESOPHAGUS 04/20/2010   GERD 04/20/2010   Hyperlipemia 11/12/2007   Essential hypertension 11/12/2007   Past Medical History:  Diagnosis Date   Elevated alkaline phosphatase level    Headache    Hyperlipidemia    Hypertension    OSA on CPAP    Thrombocytosis 07/23/2015    Family History  Problem Relation Age of Onset   Cancer Mother        Breast   Dementia Mother    Cancer Father        Lung Cancer, deceased at 28   Colon cancer Neg Hx    Liver disease Neg Hx     Past Surgical History:  Procedure Laterality Date   ABDOMINAL HYSTERECTOMY     partial    COLONOSCOPY   10/04/2006   SLF: Normal retroflexed view of the rectum/Normal colon without evidence of polyps, masses, inflammatory changes, diverticula or arteriovenous malformations   COLONOSCOPY N/A 12/07/2016   Dr. Darrick Penna: 15 mm polyp in cecum, s/p APC and clips. One 30 mm polyp in sigmoid, s/p clip, injection, tattooed. Path with one adenoma and one hamartomatous polyp. Surveillance 2021.    COLONOSCOPY N/A 09/08/2019   Procedure: COLONOSCOPY;  Surgeon: West Bali, MD;  [facility];  Service: Endoscopy;  Laterality: N/A;  8:30AM   POLYPECTOMY  12/07/2016   Procedure: POLYPECTOMY;  Surgeon: West Bali, MD;  [facility];  Service: Endoscopy;;  cecal;   POLYPECTOMY  09/08/2019   Procedure: POLYPECTOMY;  Surgeon: West Bali, MD;  [facility];  Service: Endoscopy;;   Social History   Occupational History   Not on file  Tobacco Use   Smoking status: Never   Smokeless tobacco: Never  Vaping Use   Vaping status: Never Used  Substance and Sexual Activity   Alcohol use: No   Drug use: No   Sexual activity: Yes

## 2023-05-11 ENCOUNTER — Other Ambulatory Visit: Payer: Self-pay | Admitting: Family Medicine

## 2023-05-29 ENCOUNTER — Ambulatory Visit: Payer: Medicare Other | Admitting: Family Medicine

## 2023-07-06 ENCOUNTER — Ambulatory Visit: Payer: Medicare Other | Admitting: Gastroenterology

## 2023-07-07 NOTE — Progress Notes (Unsigned)
 Referring Provider: Kerri Perches, MD Primary Care Physician:  Kerri Perches, MD Primary GI Physician: Dr. Marletta Lor  No chief complaint on file.   HPI:   Kristin Hall is a 68 y.o. female with GI history of GERD, chronically elevated alkaline phosphatase with benign workup previously (normal immunoglobulin, AMA normal x2, GGT normal on several occasions),  adenomatous colon polyps due for surveillance in 2026, presenting today for routine follow-up.    GERD:    Elevated alk phos:  - Most recent labs 02/21/23 with alk phos 158, other LFTs and bilirubin wnl.  - MR Abd MRCP w w/o 06/03/2012: Normal liver and biliary system.  - RUQ [place] 07/16/20: Fatty liver.  - Dr. Marletta Lor previously recommended updating [place] every 2 years ***    Past Medical History:  Diagnosis Date   Elevated alkaline phosphatase level    Headache    Hyperlipidemia    Hypertension    OSA on CPAP    Thrombocytosis 07/23/2015    Past Surgical History:  Procedure Laterality Date   ABDOMINAL HYSTERECTOMY     partial    COLONOSCOPY  10/04/2006   SLF: Normal retroflexed view of the rectum/Normal colon without evidence of polyps, masses, inflammatory changes, diverticula or arteriovenous malformations   COLONOSCOPY N/A 12/07/2016   Dr. Darrick Penna: 15 mm polyp in cecum, s/p APC and clips. One 30 mm polyp in sigmoid, s/p clip, injection, tattooed. Path with one adenoma and one hamartomatous polyp. Surveillance 2021.    COLONOSCOPY N/A 09/08/2019   Procedure: COLONOSCOPY;  Surgeon: West Bali, MD;  [facility]: [facility];  Service: Endoscopy;  Laterality: N/A;  8:30AM   POLYPECTOMY  12/07/2016   Procedure: POLYPECTOMY;  Surgeon: West Bali, MD;  [facility]: [facility];  Service: Endoscopy;;  cecal;   POLYPECTOMY  09/08/2019   Procedure: POLYPECTOMY;  Surgeon: West Bali, MD;  [facility]: [facility];  Service: Endoscopy;;    Current Outpatient Medications  Medication Sig Dispense Refill    amLODipine (NORVASC) 2.5 MG tablet TAKE 1 TABLET(2.5 MG) BY MOUTH DAILY 90 tablet 3   calcium citrate (CALCITRATE - DOSED IN MG ELEMENTAL CALCIUM) 950 (200 Ca) MG tablet Take 1,200 mg of elemental calcium by mouth daily. Otc for calcium     diltiazem (TIAZAC) 300 MG 24 hr capsule TAKE 1 CAPSULE(300 MG) BY MOUTH DAILY 90 capsule 3   ezetimibe-simvastatin (VYTORIN) 10-10 MG tablet TAKE 1 TABLET BY MOUTH AT BEDTIME FOR CHOLESTEROL 90 tablet 3   famotidine (PEPCID) 40 MG tablet Take 40 mg by mouth daily.     gabapentin (NEURONTIN) 100 MG capsule TAKE 1 TO 2 CAPSULES BY MOUTH AT BEDTIME FOR 2 WEEKS THEN AS NEEDED 40 capsule 2   meloxicam (MOBIC) 15 MG tablet Take one tablet once daily as needed, for uncontrolled back  pain 30 tablet 0   Multiple Vitamins-Minerals (ALIVE WOMENS 50+) TABS Take 1 tablet by mouth daily.     olmesartan-hydrochlorothiazide (BENICAR HCT) 40-25 MG tablet TAKE 1 TABLET BY MOUTH DAILY 90 tablet 3   pantoprazole (PROTONIX) 40 MG tablet TAKE 1 TABLET(40 MG) BY MOUTH DAILY 90 tablet 3   potassium chloride SA (KLOR-CON M) 20 MEQ tablet TAKE 1 TABLET(20 MEQ) BY MOUTH TWICE DAILY 180 tablet 3   predniSONE (DELTASONE) 20 MG tablet Take 1 tablet (20 mg total) by mouth 2 (two) times daily with a meal. 10 tablet 0   No current facility-administered medications for this visit.  Allergies as of 07/09/2023   (No Active Allergies)    Family History  Problem Relation Age of Onset   Cancer Mother        Breast   Dementia Mother    Cancer Father        Lung Cancer, deceased at 44   Colon cancer Neg Hx    Liver disease Neg Hx     Social History   Socioeconomic History   Marital status: Widowed    Spouse name: Not on file   Number of children: Not on file   Years of education: Not on file   Highest education level: Associate degree: occupational, Scientist, product/process development, or vocational program  Occupational History   Not on file  Tobacco Use   Smoking status: Never   Smokeless tobacco:  Never  Vaping Use   Vaping status: Never Used  Substance and Sexual Activity   Alcohol use: No   Drug use: No   Sexual activity: Yes  Other Topics Concern   Not on file  Social History Narrative   Not on file   Social Drivers of Health   Financial Resource Strain: Low Risk  (04/17/2023)   Overall Financial Resource Strain (CARDIA)    Difficulty of Paying Living Expenses: Not hard at all  Food Insecurity: No Food Insecurity (04/17/2023)   Hunger Vital Sign    Worried About Running Out of Food in the Last Year: Never true    Ran Out of Food in the Last Year: Never true  Transportation Needs: No Transportation Needs (04/17/2023)   PRAPARE - Administrator, Civil Service (Medical): No    Lack of Transportation (Non-Medical): No  Physical Activity: Insufficiently Active (04/17/2023)   Exercise Vital Sign    Days of Exercise per Week: 3 days    Minutes of Exercise per Session: 30 min  Stress: No Stress Concern Present (04/17/2023)   [employer] of Occupational Health - Occupational Stress Questionnaire    Feeling of Stress : Not at all  Social Connections: Moderately Isolated (04/17/2023)   Social Connection and Isolation Panel [NHANES]    Frequency of Communication with Friends and Family: More than three times a week    Frequency of Social Gatherings with Friends and Family: More than three times a week    Attends Religious Services: More than 4 times per year    Active Member of [employer] or Organizations: No    Attends Banker Meetings: Never    Marital Status: Widowed    Review of Systems: Gen: Denies fever, chills, anorexia. Denies fatigue, weakness, weight loss.  CV: Denies chest pain, palpitations, syncope, peripheral edema, and claudication. Resp: Denies dyspnea at rest, cough, wheezing, coughing up blood, and pleurisy. GI: Denies vomiting blood, jaundice, and fecal incontinence.   Denies dysphagia or odynophagia. Derm: Denies rash, itching, dry  skin Psych: Denies depression, anxiety, memory loss, confusion. No homicidal or suicidal ideation.  Heme: Denies bruising, bleeding, and enlarged lymph nodes.  Physical Exam: There were no vitals taken for this visit. General:   Alert and oriented. No distress noted. Pleasant and cooperative.  Head:  Normocephalic and atraumatic. Eyes:  Conjuctiva clear without scleral icterus. Heart:  S1, S2 present without murmurs appreciated. Lungs:  Clear to auscultation bilaterally. No wheezes, rales, or rhonchi. No distress.  Abdomen:  +BS, soft, non-tender and non-distended. No rebound or guarding. No HSM or masses noted. Msk:  Symmetrical without gross deformities. Normal posture. Extremities:  Without edema. Neurologic:  Alert and  oriented x4 Psych:  Normal mood and affect.    Assessment:     Plan:  ***   Ermalinda Memos, PA-C [facility] 07/09/2023

## 2023-07-09 ENCOUNTER — Ambulatory Visit: Payer: Medicare Other | Admitting: Gastroenterology

## 2023-07-09 ENCOUNTER — Encounter: Payer: Self-pay | Admitting: Gastroenterology

## 2023-07-09 VITALS — BP 127/79 | HR 89 | Temp 98.6°F | Ht 62.5 in | Wt 182.0 lb

## 2023-07-09 DIAGNOSIS — K219 Gastro-esophageal reflux disease without esophagitis: Secondary | ICD-10-CM | POA: Diagnosis not present

## 2023-07-09 DIAGNOSIS — R748 Abnormal levels of other serum enzymes: Secondary | ICD-10-CM

## 2023-07-09 DIAGNOSIS — K760 Fatty (change of) liver, not elsewhere classified: Secondary | ICD-10-CM

## 2023-07-09 NOTE — Patient Instructions (Addendum)
 Continue pantoprazole 40 mg daily.  Will get you scheduled for an ultrasound of your liver to follow-up on fatty liver disease and elevated alkaline phosphatase.  Instructions for fatty liver: Recommend 1-2# weight loss per week until ideal body weight through exercise & diet. Low fat/cholesterol diet.   Avoid sweets, sodas, fruit juices, sweetened beverages like tea, etc. Gradually increase exercise from 15 min daily up to 1 hr per day 5 days/week. Avoid alcohol use.  When you have your routine blood work with Dr. Lodema Hong, I would like for her to check the following: Liver enzymes including alkaline phosphatase Alkaline phosphatase isoenzymes  We can plan to follow-up in 6 months or sooner if needed.  It was great to see you again today!  Ermalinda Memos, PA-C [facility]

## 2023-07-11 ENCOUNTER — Ambulatory Visit (HOSPITAL_COMMUNITY)
Admission: RE | Admit: 2023-07-11 | Discharge: 2023-07-11 | Disposition: A | Discharge status: Home / Self Care | Payer: Medicare Other | Source: Ambulatory Visit | Attending: Gastroenterology | Admitting: Gastroenterology

## 2023-07-11 DIAGNOSIS — R748 Abnormal levels of other serum enzymes: Secondary | ICD-10-CM | POA: Diagnosis not present

## 2023-07-11 DIAGNOSIS — K760 Fatty (change of) liver, not elsewhere classified: Secondary | ICD-10-CM | POA: Insufficient documentation

## 2023-07-30 ENCOUNTER — Other Ambulatory Visit: Payer: Self-pay | Admitting: *Deleted

## 2023-07-30 ENCOUNTER — Encounter: Payer: Self-pay | Admitting: *Deleted

## 2023-07-30 DIAGNOSIS — R748 Abnormal levels of other serum enzymes: Secondary | ICD-10-CM

## 2023-07-30 DIAGNOSIS — K760 Fatty (change of) liver, not elsewhere classified: Secondary | ICD-10-CM

## 2023-08-02 ENCOUNTER — Encounter: Payer: Self-pay | Admitting: Nurse Practitioner

## 2023-08-02 ENCOUNTER — Ambulatory Visit: Payer: Medicare Other | Admitting: Nurse Practitioner

## 2023-08-02 VITALS — BP 128/80 | HR 89 | Ht 62.5 in | Wt 181.0 lb

## 2023-08-02 DIAGNOSIS — E66811 Obesity, class 1: Secondary | ICD-10-CM | POA: Diagnosis not present

## 2023-08-02 DIAGNOSIS — G4733 Obstructive sleep apnea (adult) (pediatric): Secondary | ICD-10-CM | POA: Diagnosis not present

## 2023-08-02 DIAGNOSIS — Z789 Other specified health status: Secondary | ICD-10-CM

## 2023-08-02 NOTE — Progress Notes (Unsigned)
 @Patient  ID: Kristin Hall, female    DOB: 1955-06-27, 68 y.o.   MRN: [id number]  Chief Complaint  Patient presents with   Sleep Consult    Referred by Dr. Syliva Overman- pt with known OSA. She has not been able to tolerate CPAP and has not used in over a year at least.     Referring provider: Kerri Perches, MD  HPI: 68 year old female, never smoker referred for sleep consult.  Past medical history significant for hypertension, OSA on CPAP, GERD, diastolic dysfunction, HLD, obesity.  TEST/EVENTS:  06/20/2017 NPSG: AHI 27.3/h, SpO2 low 76%.  Moderate OSA; weight 183 lb  08/02/2023: Today - sleep consult Discussed the use of AI scribe software for clinical note transcription with the patient, who gave verbal consent to proceed.  History of Present Illness   Kristin Hall is a 68 year old female with sleep apnea who presents for sleep consult   She has not used her CPAP machine for at least a year. She finds the CPAP machine uncomfortable, particularly due to the headgear and difficulty sleeping on her back. The machine is approximately four years old.   She acknowledges snoring at night as reported by others but denies waking up gasping for air or experiencing morning headaches. No drowsy driving or falling asleep while driving, but she does experience daytime sleepiness, typically taking a nap between 2 and 4 PM daily.  Her sleep apnea was classified as moderate during her initial sleep study, with an average of 27 events per hour. She has maintained a stable weight over the past five years, with no significant changes.  She would like to discuss alternative options for management of her sleep apnea.  She does have a cousin who got the inspire device and she is interested in this.  She goes to bed between 9 and 11 PM.  Typically takes her about an hour to fall asleep.  Wakes 2-3 times a night.  Gets up around 4 AM.  She is retired.  Weight has been stable.  Last sleep study  was in 2019.  Does not use any supplemental oxygen.  She is a never smoker.  Does not drink alcohol.  No excessive caffeine intake.  Lives by herself.  Family history of cancer.  Epworth 6      No Active Allergies  Immunization History  Administered Date(s) Administered   Fluad Quad(high Dose 65+) 02/08/2022, 01/30/2023   Influenza Split 02/09/2014   Influenza Whole 06/05/2007   Influenza,inj,Quad PF,6+ Mos 02/18/2018, 01/14/2020, 02/11/2021   Influenza-Unspecified 02/02/2017   Moderna Covid-19 Vaccine Bivalent Booster 65yrs & up 01/30/2023   Moderna SARS-COV2 Booster Vaccination 02/08/2022   Moderna Sars-Covid-2 Vaccination 06/07/2019, 07/10/2019, 03/25/2020   PNEUMOCOCCAL CONJUGATE-20 12/20/2021   Td 08/15/2006   Tdap 10/02/2016   Zoster Recombinant(Shingrix) 02/19/2017, 10/29/2017    Past Medical History:  Diagnosis Date   Elevated alkaline phosphatase level    Headache    Hyperlipidemia    Hypertension    OSA on CPAP    Thrombocytosis 07/23/2015    Tobacco History: Social History   Tobacco Use  Smoking Status Never  Smokeless Tobacco Never   Counseling given: Not Answered   Outpatient Medications Prior to Visit  Medication Sig Dispense Refill   amLODipine (NORVASC) 2.5 MG tablet TAKE 1 TABLET(2.5 MG) BY MOUTH DAILY 90 tablet 3   calcium citrate (CALCITRATE - DOSED IN MG ELEMENTAL CALCIUM) 950 (200 Ca) MG tablet Take 1,200 mg of elemental calcium by  mouth daily. Otc for calcium     diltiazem (TIAZAC) 300 MG 24 hr capsule TAKE 1 CAPSULE(300 MG) BY MOUTH DAILY 90 capsule 3   ezetimibe-simvastatin (VYTORIN) 10-10 MG tablet TAKE 1 TABLET BY MOUTH AT BEDTIME FOR CHOLESTEROL 90 tablet 3   famotidine (PEPCID) 40 MG tablet Take 40 mg by mouth daily.     gabapentin (NEURONTIN) 100 MG capsule TAKE 1 TO 2 CAPSULES BY MOUTH AT BEDTIME FOR 2 WEEKS THEN AS NEEDED 40 capsule 2   meloxicam (MOBIC) 15 MG tablet Take one tablet once daily as needed, for uncontrolled back  pain  30 tablet 0   Multiple Vitamins-Minerals (ALIVE WOMENS 50+) TABS Take 1 tablet by mouth daily.     olmesartan-hydrochlorothiazide (BENICAR HCT) 40-25 MG tablet TAKE 1 TABLET BY MOUTH DAILY 90 tablet 3   pantoprazole (PROTONIX) 40 MG tablet TAKE 1 TABLET(40 MG) BY MOUTH DAILY 90 tablet 3   potassium chloride SA (KLOR-CON M) 20 MEQ tablet TAKE 1 TABLET(20 MEQ) BY MOUTH TWICE DAILY 180 tablet 3   predniSONE (DELTASONE) 20 MG tablet Take 1 tablet (20 mg total) by mouth 2 (two) times daily with a meal. (Patient not taking: Reported on 07/09/2023) 10 tablet 0   No facility-administered medications prior to visit.     Review of Systems:   Constitutional: No weight loss or gain, night sweats, fevers, chills, or lassitude. +fatigue  HEENT: No headaches, difficulty swallowing, tooth/dental problems, or sore throat. No sneezing, itching, ear ache, nasal congestion, or post nasal drip CV:  No chest pain, orthopnea, PND, swelling in lower extremities, anasarca, dizziness, palpitations, syncope Resp: +snoring. No shortness of breath with exertion or at rest. No excess mucus or change in color of mucus. No productive or non-productive. No hemoptysis. No wheezing.  No chest wall deformity GI:  No heartburn, indigestion GU: +nocturia. No dysuria, daytime frequency, hematuria  Skin: No rash, lesions, ulcerations MSK:  No joint pain or swelling.   Neuro: No dizziness or lightheadedness.  Psych: No depression or anxiety. Mood stable. +sleep disturbance     Physical Exam:  BP 128/80 (BP Location: Left Arm, Cuff Size: Normal)   Pulse 89   Ht 5' 2.5" (1.588 m)   Wt 181 lb (82.1 kg)   SpO2 98%   BMI 32.58 kg/m   GEN: Pleasant, interactive, well-appearing; obese; in no acute distress HEENT:  Normocephalic and atraumatic. PERRLA. Sclera white. Nasal turbinates pink, moist and patent bilaterally. No rhinorrhea present. Oropharynx pink and moist, without exudate or edema. No lesions, ulcerations, or  postnasal drip. Mallampati II NECK:  Supple w/ fair ROM. Thyroid symmetrical with no goiter or nodules palpated. No lymphadenopathy.   CV: RRR, no m/r/g, no peripheral edema. Pulses intact, +2 bilaterally. No cyanosis, pallor or clubbing. PULMONARY:  Unlabored, regular breathing. Clear bilaterally A&P w/o wheezes/rales/rhonchi. No accessory muscle use.  GI: BS present and normoactive. Soft, non-tender to palpation. No organomegaly or masses detected. MSK: No erythema, warmth or tenderness. Cap refil <2 sec all extrem. No deformities or joint swelling noted.  Neuro: A/Ox3. No focal deficits noted.   Skin: Warm, no lesions or rashe Psych: Normal affect and behavior. Judgement and thought content appropriate.     Lab Results:  CBC    Component Value Date/Time   WBC 11.2 (H) 10/15/2022 0815   RBC 4.59 10/15/2022 0815   HGB 13.4 10/15/2022 0815   HGB 13.4 08/18/2022 0941   HCT 41.8 10/15/2022 0815   HCT 40.5 08/18/2022 0941  PLT 424 (H) 10/15/2022 0815   PLT 417 08/18/2022 0941   MCV 91.1 10/15/2022 0815   MCV 88 08/18/2022 0941   MCH 29.2 10/15/2022 0815   MCHC 32.1 10/15/2022 0815   RDW 13.0 10/15/2022 0815   RDW 12.4 08/18/2022 0941   LYMPHSABS 2.6 10/15/2022 0815   MONOABS 0.8 10/15/2022 0815   EOSABS 0.0 10/15/2022 0815   BASOSABS 0.0 10/15/2022 0815    BMET    Component Value Date/Time   NA 141 02/21/2023 1039   K 4.0 02/21/2023 1039   CL 101 02/21/2023 1039   CO2 26 02/21/2023 1039   GLUCOSE 96 02/21/2023 1039   GLUCOSE 116 (H) 10/15/2022 0815   BUN 11 02/21/2023 1039   CREATININE 0.87 02/21/2023 1039   CREATININE 0.82 02/15/2018 1013   CALCIUM 10.1 02/21/2023 1039   GFRNONAA >60 10/15/2022 0815   GFRNONAA 77 02/15/2018 1013   GFRAA 82 06/17/2020 0933   GFRAA 90 02/15/2018 1013    BNP No results found for: "BNP"   Imaging:  [place] ELASTOGRAPHY LIVER Result Date: 07/20/2023 CLINICAL DATA:  Elevated alkaline phosphatase. EXAM: [place] ELASTOGRAPHY HEPATIC  TECHNIQUE: Sonography of the liver was performed. In addition, ultrasound elastography evaluation of the liver was performed. A region of interest was placed within the right lobe of the liver. Following application of a compressive sonographic pulse, tissue compressibility was assessed. Multiple assessments were performed at the selected site. Median tissue compressibility was determined. Previously, hepatic stiffness was assessed by shear wave velocity. Based on recently published Society of Radiologists in Ultrasound consensus article, reporting is now recommended to be performed in the SI units of pressure (kiloPascals) representing hepatic stiffness/elasticity. The obtained result is compared to the published reference standards. (cACLD = compensated Advanced Chronic Liver Disease) COMPARISON:  CT 11/24/2022.  Ultrasound 07/16/2020. FINDINGS: Liver: Diffusely echogenic hepatic parenchyma consistent with fatty liver infiltration. Portal vein is patent on color Doppler imaging with normal direction of blood flow towards the liver. ULTRASOUND HEPATIC ELASTOGRAPHY Device: Siemens Helix VTQ Patient position: Supine Transducer: 9C2 Number of measurements: 10 Hepatic segment:  8 Median kPa: 11.9 IQR: 10.2 IQR/Median kPa ratio: 0.86 Data quality: i. IQR/Median kPa ratio of 0.6 or greater when using the DAX probe indicates reduced accuracy Diagnostic category: >9 kPa and ?13 kPa: suggestive of cACLD, but needs further testing The use of hepatic elastography is applicable to patients with viral hepatitis and non-alcoholic fatty liver disease. At this time, there is insufficient data for the referenced cut-off values and use in other causes of liver disease, including alcoholic liver disease. Patients, however, may be assessed by elastography and serve as their own reference standard/baseline. In patients with non-alcoholic liver disease, the values suggesting compensated advanced chronic liver disease (cACLD) may be  lower, and patients may need additional testing with elasticity results of 7-9 kPa. Please note that abnormal hepatic elasticity and shear wave velocities may also be identified in clinical settings other than with hepatic fibrosis, such as: acute hepatitis, elevated right heart and central venous pressures including use of beta blockers, veno-occlusive disease (Budd-Chiari), infiltrative processes such as mastocytosis/amyloidosis/infiltrative tumor/lymphoma, extrahepatic cholestasis, with hyperemia in the post-prandial state, and with liver transplantation. Correlation with patient history, laboratory data, and clinical condition recommended. Diagnostic Categories: < or =5 kPa: high probability of being normal < or =9 kPa: in the absence of other known clinical signs, rules out cACLD >9 kPa and ?13 kPa: suggestive of cACLD, but needs further testing >13 kPa: highly suggestive of cACLD >  or =17 kPa: highly suggestive of cACLD with an increased probability of clinically significant portal hypertension IMPRESSION: ULTRASOUND LIVER: Fatty liver infiltration ULTRASOUND HEPATIC ELASTOGRAPHY: Median kPa:  11.9 Diagnostic category: >9 kPa and ?13 kPa: suggestive of cACLD, but needs further testing Electronically Signed   By: Karen Kays M.D.   On: 07/20/2023 15:02    Administration History     None           No data to display          No results found for: "NITRICOXIDE"      Assessment & Plan:   OSA on CPAP She has a history of moderate sleep apnea, previously on CPAP.  Has had issues with tolerating CPAP and interested in alternative therapies.  We reviewed risks of untreated moderate to severe sleep apnea and potential treatment options.  She is interested in inspire device.  Discussed risk/benefits of procedure.  We will need to start by reassessing the severity of her sleep apnea since her last sleep study was 5 years ago.  Home sleep study ordered today.  If she continues to have moderate  to severe sleep apnea, we will get her set up for drug-induced sleep endoscopy to see if she would qualify for inspire.  If she is cleared after this, we will refer her to ENT for placement.  Safe during practices reviewed.  Healthy weight loss encouraged.  Patient Instructions  Given your symptoms, I am concerned that you still have sleep disordered breathing with sleep apnea. You will need a sleep study for further evaluation. Someone will contact you to schedule this.   We discussed how untreated sleep apnea puts an individual at risk for cardiac arrhthymias, pulm HTN, DM, stroke and increases their risk for daytime accidents. We also briefly reviewed treatment options including weight loss, side sleeping position, oral appliance, CPAP therapy or referral to ENT for possible surgical options  Use caution when driving and pull over if you become sleepy.  If you still have moderate to severe sleep apnea, we will work you up for [facility] device  If you have mild sleep apnea, we could consider oral appliance or positional sleeping   Follow up in 6 weeks with Katie Avia Merkley,NP to go over sleep study results, or sooner, if needed. Friday PM virtual clinic preferred       Obesity (BMI 30.0-34.9) BMI 32.  Weight has been stable.  Healthy weight loss encouraged.  Intolerance of continuous positive airway pressure (CPAP) ventilation See above plan  Advised if symptoms do not improve or worsen, to please contact office for sooner follow up or seek emergency care.   I spent 45 minutes of dedicated to the care of this patient on the date of this encounter to include pre-visit review of records, face-to-face time with the patient discussing conditions above, post visit ordering of testing, clinical documentation with the electronic health record, making appropriate referrals as documented, and communicating necessary findings to members of the patients care team.  Noemi Chapel, NP 08/03/2023  Pt  aware and understands NP's role.

## 2023-08-02 NOTE — Patient Instructions (Addendum)
 Given your symptoms, I am concerned that you still have sleep disordered breathing with sleep apnea. You will need a sleep study for further evaluation. Someone will contact you to schedule this.   We discussed how untreated sleep apnea puts an individual at risk for cardiac arrhthymias, pulm HTN, DM, stroke and increases their risk for daytime accidents. We also briefly reviewed treatment options including weight loss, side sleeping position, oral appliance, CPAP therapy or referral to ENT for possible surgical options  Use caution when driving and pull over if you become sleepy.  If you still have moderate to severe sleep apnea, we will work you up for [facility] device  If you have mild sleep apnea, we could consider oral appliance or positional sleeping   Follow up in 6 weeks with Katie Erikah Thumm,NP to go over sleep study results, or sooner, if needed. Friday PM virtual clinic preferred

## 2023-08-03 ENCOUNTER — Encounter: Payer: Self-pay | Admitting: Nurse Practitioner

## 2023-08-03 DIAGNOSIS — Z789 Other specified health status: Secondary | ICD-10-CM | POA: Insufficient documentation

## 2023-08-03 NOTE — Assessment & Plan Note (Signed)
 BMI 32.  Weight has been stable.  Healthy weight loss encouraged.

## 2023-08-03 NOTE — Assessment & Plan Note (Signed)
 See above plan.

## 2023-08-03 NOTE — Assessment & Plan Note (Signed)
 She has a history of moderate sleep apnea, previously on CPAP.  Has had issues with tolerating CPAP and interested in alternative therapies.  We reviewed risks of untreated moderate to severe sleep apnea and potential treatment options.  She is interested in inspire device.  Discussed risk/benefits of procedure.  We will need to start by reassessing the severity of her sleep apnea since her last sleep study was 5 years ago.  Home sleep study ordered today.  If she continues to have moderate to severe sleep apnea, we will get her set up for drug-induced sleep endoscopy to see if she would qualify for inspire.  If she is cleared after this, we will refer her to ENT for placement.  Safe during practices reviewed.  Healthy weight loss encouraged.  Patient Instructions  Given your symptoms, I am concerned that you still have sleep disordered breathing with sleep apnea. You will need a sleep study for further evaluation. Someone will contact you to schedule this.   We discussed how untreated sleep apnea puts an individual at risk for cardiac arrhthymias, pulm HTN, DM, stroke and increases their risk for daytime accidents. We also briefly reviewed treatment options including weight loss, side sleeping position, oral appliance, CPAP therapy or referral to ENT for possible surgical options  Use caution when driving and pull over if you become sleepy.  If you still have moderate to severe sleep apnea, we will work you up for [facility] device  If you have mild sleep apnea, we could consider oral appliance or positional sleeping   Follow up in 6 weeks with Katie Osa Campoli,NP to go over sleep study results, or sooner, if needed. Friday PM virtual clinic preferred

## 2023-08-16 ENCOUNTER — Encounter

## 2023-08-16 DIAGNOSIS — G4733 Obstructive sleep apnea (adult) (pediatric): Secondary | ICD-10-CM

## 2023-08-16 DIAGNOSIS — G4730 Sleep apnea, unspecified: Secondary | ICD-10-CM | POA: Diagnosis not present

## 2023-08-22 ENCOUNTER — Ambulatory Visit (INDEPENDENT_AMBULATORY_CARE_PROVIDER_SITE_OTHER): Payer: Medicare Other | Admitting: Family Medicine

## 2023-08-22 VITALS — BP 122/79 | HR 96 | Resp 16 | Ht 62.5 in | Wt 181.0 lb

## 2023-08-22 DIAGNOSIS — E785 Hyperlipidemia, unspecified: Secondary | ICD-10-CM

## 2023-08-22 DIAGNOSIS — Z1231 Encounter for screening mammogram for malignant neoplasm of breast: Secondary | ICD-10-CM | POA: Diagnosis not present

## 2023-08-22 DIAGNOSIS — R748 Abnormal levels of other serum enzymes: Secondary | ICD-10-CM

## 2023-08-22 DIAGNOSIS — G4733 Obstructive sleep apnea (adult) (pediatric): Secondary | ICD-10-CM | POA: Diagnosis not present

## 2023-08-22 DIAGNOSIS — E559 Vitamin D deficiency, unspecified: Secondary | ICD-10-CM

## 2023-08-22 DIAGNOSIS — Z131 Encounter for screening for diabetes mellitus: Secondary | ICD-10-CM

## 2023-08-22 DIAGNOSIS — I10 Essential (primary) hypertension: Secondary | ICD-10-CM

## 2023-08-22 DIAGNOSIS — D75839 Thrombocytosis, unspecified: Secondary | ICD-10-CM

## 2023-08-22 DIAGNOSIS — E66811 Obesity, class 1: Secondary | ICD-10-CM

## 2023-08-22 DIAGNOSIS — R7303 Prediabetes: Secondary | ICD-10-CM

## 2023-08-22 DIAGNOSIS — K219 Gastro-esophageal reflux disease without esophagitis: Secondary | ICD-10-CM

## 2023-08-22 NOTE — Patient Instructions (Addendum)
 Annual exam 10/10 or after, call if you need me sooner  Please schedule mammogram at checkout . 9 Nurse pls order)  Fasting labs today, nurse pls order CBC, lipid, cmp and EGFR , hBA1C, tSH and vit D, pls let lab staff know that GI woulfd like the NASH fibroSURE test they ordered on 3/17/  done today as well  It is important that you exercise regularly at least 30 minutes 5 times a week. If you develop chest pain, have severe difficulty breathing, or feel very tired, stop exercising immediately and seek medical attention    Thanks for choosing [facility], we consider it a privelige to serve you.

## 2023-08-23 ENCOUNTER — Encounter: Payer: Self-pay | Admitting: Family Medicine

## 2023-08-23 ENCOUNTER — Other Ambulatory Visit: Payer: Self-pay

## 2023-08-23 DIAGNOSIS — R739 Hyperglycemia, unspecified: Secondary | ICD-10-CM

## 2023-08-23 DIAGNOSIS — I10 Essential (primary) hypertension: Secondary | ICD-10-CM

## 2023-08-23 DIAGNOSIS — R7303 Prediabetes: Secondary | ICD-10-CM | POA: Insufficient documentation

## 2023-08-23 DIAGNOSIS — E785 Hyperlipidemia, unspecified: Secondary | ICD-10-CM

## 2023-08-23 LAB — LIPID PANEL
Chol/HDL Ratio: 4.3 ratio (ref 0.0–4.4)
Cholesterol, Total: 172 mg/dL (ref 100–199)
HDL: 40 mg/dL (ref >39)
LDL Chol Calc (NIH): 105 mg/dL — ABNORMAL HIGH (ref 0–99)
Triglycerides: 154 mg/dL — ABNORMAL HIGH (ref 0–149)
VLDL Cholesterol Cal: 27 mg/dL (ref 5–40)

## 2023-08-23 LAB — HEMOGLOBIN A1C
Est. average glucose Bld gHb Est-mCnc: 128 mg/dL
Hgb A1c MFr Bld: 6.1 % — ABNORMAL HIGH (ref 4.8–5.6)

## 2023-08-23 LAB — CBC WITH DIFFERENTIAL/PLATELET
Basophils Absolute: 0.1 10*3/uL (ref 0.0–0.2)
Basos: 1 %
EOS (ABSOLUTE): 0.1 10*3/uL (ref 0.0–0.4)
Eos: 1 %
Hematocrit: 40.9 % (ref 34.0–46.6)
Hemoglobin: 13.5 g/dL (ref 11.1–15.9)
Immature Grans (Abs): 0 10*3/uL (ref 0.0–0.1)
Immature Granulocytes: 0 %
Lymphocytes Absolute: 2.8 10*3/uL (ref 0.7–3.1)
Lymphs: 26 %
MCH: 29.6 pg (ref 26.6–33.0)
MCHC: 33 g/dL (ref 31.5–35.7)
MCV: 90 fL (ref 79–97)
Monocytes Absolute: 0.6 10*3/uL (ref 0.1–0.9)
Monocytes: 5 %
Neutrophils Absolute: 7.1 10*3/uL — ABNORMAL HIGH (ref 1.4–7.0)
Neutrophils: 67 %
Platelets: 417 10*3/uL (ref 150–450)
RBC: 4.56 x10E6/uL (ref 3.77–5.28)
RDW: 11.8 % (ref 11.7–15.4)
WBC: 10.7 10*3/uL (ref 3.4–10.8)

## 2023-08-23 LAB — CMP14+EGFR
ALT: 14 IU/L (ref 0–32)
AST: 21 IU/L (ref 0–40)
Albumin: 4.3 g/dL (ref 3.9–4.9)
Alkaline Phosphatase: 186 IU/L — ABNORMAL HIGH (ref 44–121)
BUN/Creatinine Ratio: 9 — ABNORMAL LOW (ref 12–28)
BUN: 9 mg/dL (ref 8–27)
Bilirubin Total: 0.6 mg/dL (ref 0.0–1.2)
CO2: 24 mmol/L (ref 20–29)
Calcium: 10.4 mg/dL — ABNORMAL HIGH (ref 8.7–10.3)
Chloride: 102 mmol/L (ref 96–106)
Creatinine, Ser: 1.02 mg/dL — ABNORMAL HIGH (ref 0.57–1.00)
Globulin, Total: 3.3 g/dL (ref 1.5–4.5)
Glucose: 107 mg/dL — ABNORMAL HIGH (ref 70–99)
Potassium: 4.2 mmol/L (ref 3.5–5.2)
Sodium: 144 mmol/L (ref 134–144)
Total Protein: 7.6 g/dL (ref 6.0–8.5)
eGFR: 61 mL/min/{1.73_m2} (ref >59)

## 2023-08-23 LAB — TSH: TSH: 1.87 u[IU]/mL (ref 0.450–4.500)

## 2023-08-23 LAB — VITAMIN D 25 HYDROXY (VIT D DEFICIENCY, FRACTURES): Vit D, 25-Hydroxy: 42.3 ng/mL (ref 30.0–100.0)

## 2023-08-23 NOTE — Assessment & Plan Note (Signed)
 Compliant with treatmet , managed by Pulmonary

## 2023-08-23 NOTE — Assessment & Plan Note (Signed)
 Being evaluated and managed by Hematology

## 2023-08-23 NOTE — Progress Notes (Signed)
 Kristin Hall     MRN: [id number]      DOB: 11/10/1955  Chief Complaint  Patient presents with   Hypertension    Follow up     HPI Ms. Kristin Hall is here for follow up and re-evaluation of chronic medical conditions, medication management and review of any available recent lab and radiology data.  Preventive health is updated, specifically  Cancer screening and Immunization.   Questions or concerns regarding consultations or procedures which the PT has had in the interim are  addressed.Very pleased with visits with GI and hematology The PT denies any adverse reactions to current medications since the last visit.  There are no new concerns.  There are no specific complaints   ROS Denies recent fever or chills. Denies sinus pressure, nasal congestion, ear pain or sore throat. Denies chest congestion, productive cough or wheezing. Denies chest pains, palpitations and leg swelling Denies abdominal pain, nausea, vomiting,diarrhea or constipation.   Denies dysuria, frequency, hesitancy or incontinence. Denies uncontrolled  joint pain, swelling and limitation in mobility. Denies headaches, seizures, numbness, or tingling. Denies depression, anxiety or insomnia. Denies skin break down or rash.   PE  BP 122/79   Pulse 96   Resp 16   Ht 5' 2.5" (1.588 m)   Wt 181 lb (82.1 kg)   SpO2 94%   BMI 32.58 kg/m   Patient alert and oriented and in no cardiopulmonary distress.  HEENT: No facial asymmetry, EOMI,     Neck supple .  Chest: Clear to auscultation bilaterally.  CVS: S1, S2 no murmurs, no S3.Regular rate.  ABD: Soft non tender.   Ext: No edema  MS: Adequate ROM spine, shoulders, hips and knees.  Skin: Intact, no ulcerations or rash noted.  Psych: Good eye contact, normal affect. Memory intact not anxious or depressed appearing.  CNS: CN 2-12 intact, power,  normal throughout.no focal deficits noted.   Assessment & Plan  Essential hypertension Controlled, no  change in medication DASH diet and commitment to daily physical activity for a minimum of 30 minutes discussed and encouraged, as a part of hypertension management. The importance of attaining a healthy weight is also discussed.     08/22/2023    8:56 AM 08/02/2023    2:35 PM 07/09/2023    2:22 PM 04/30/2023    8:31 AM 04/18/2023    8:34 AM 02/21/2023    9:42 AM 02/07/2023   10:05 AM  BP/Weight  Systolic BP 122 128 127 129 138 129 --  Diastolic BP 79 80 79 89 77 78 --  Wt. (Lbs) 181 181 182 180 182.12 184.04 182  BMI 32.58 kg/m2 32.58 kg/m2 32.76 kg/m2 31.89 kg/m2 32.26 kg/m2 32.6 kg/m2 32.24 kg/m2       Elevated alkaline phosphatase level Being followed by GI  Prediabetes Patient educated about the importance of limiting  Carbohydrate intake , the need to commit to daily physical activity for a minimum of 30 minutes , and to commit weight loss. The fact that changes in all these areas will reduce or eliminate all together the development of diabetes is stressed.      Latest Ref Rng & Units 08/22/2023    9:37 AM 02/21/2023   10:39 AM 10/15/2022    8:15 AM 08/18/2022    9:41 AM 07/29/2021    9:03 AM  Diabetic Labs  HbA1c 4.8 - 5.6 % 6.1  5.9      Chol 100 - 199 mg/dL 841  324  172  161   HDL >39 mg/dL 40  42   45  47   Calc LDL 0 - 99 mg/dL 409  94   811  97   Triglycerides 0 - 149 mg/dL 914  782   956  90   Creatinine 0.57 - 1.00 mg/dL 2.13  0.86  5.78  4.69  0.86       08/22/2023    8:56 AM 08/02/2023    2:35 PM 07/09/2023    2:22 PM 04/30/2023    8:31 AM 04/18/2023    8:34 AM 02/21/2023    9:42 AM 02/07/2023   10:05 AM  BP/Weight  Systolic BP 122 128 127 129 138 129 --  Diastolic BP 79 80 79 89 77 78 --  Wt. (Lbs) 181 181 182 180 182.12 184.04 182  BMI 32.58 kg/m2 32.58 kg/m2 32.76 kg/m2 31.89 kg/m2 32.26 kg/m2 32.6 kg/m2 32.24 kg/m2       No data to display          Updated lab needed at/ before next visit.   Vitamin D deficiency Updated lab needed at/ before next  visit.   Thrombocytosis (HCC) Being evaluated and managed by Hematology  OSA on CPAP Compliant with treatmet , managed by Pulmonary  Obesity (BMI 30.0-34.9)  Patient re-educated about  the importance of commitment to a  minimum of 150 minutes of exercise per week as able.  The importance of healthy food choices with portion control discussed, as well as eating regularly and within a 12 hour window most days. The need to choose "clean , green" food 50 to 75% of the time is discussed, as well as to make water the primary drink and set a goal of 64 ounces water daily.       08/22/2023    8:56 AM 08/02/2023    2:35 PM 07/09/2023    2:22 PM  Weight /BMI  Weight 181 lb 181 lb 182 lb  Height 5' 2.5" (1.588 m) 5' 2.5" (1.588 m) 5' 2.5" (1.588 m)  BMI 32.58 kg/m2 32.58 kg/m2 32.76 kg/m2    unchanged  GERD Controlled, no change in medication

## 2023-08-23 NOTE — Assessment & Plan Note (Signed)
  Patient re-educated about  the importance of commitment to a  minimum of 150 minutes of exercise per week as able.  The importance of healthy food choices with portion control discussed, as well as eating regularly and within a 12 hour window most days. The need to choose "clean , green" food 50 to 75% of the time is discussed, as well as to make water the primary drink and set a goal of 64 ounces water daily.       08/22/2023    8:56 AM 08/02/2023    2:35 PM 07/09/2023    2:22 PM  Weight /BMI  Weight 181 lb 181 lb 182 lb  Height 5' 2.5" (1.588 m) 5' 2.5" (1.588 m) 5' 2.5" (1.588 m)  BMI 32.58 kg/m2 32.58 kg/m2 32.76 kg/m2    unchanged

## 2023-08-23 NOTE — Assessment & Plan Note (Signed)
Being followed by GI 

## 2023-08-23 NOTE — Assessment & Plan Note (Signed)
 Patient educated about the importance of limiting  Carbohydrate intake , the need to commit to daily physical activity for a minimum of 30 minutes , and to commit weight loss. The fact that changes in all these areas will reduce or eliminate all together the development of diabetes is stressed.      Latest Ref Rng & Units 08/22/2023    9:37 AM 02/21/2023   10:39 AM 10/15/2022    8:15 AM 08/18/2022    9:41 AM 07/29/2021    9:03 AM  Diabetic Labs  HbA1c 4.8 - 5.6 % 6.1  5.9      Chol 100 - 199 mg/dL 604  540   981  191   HDL >39 mg/dL 40  42   45  47   Calc LDL 0 - 99 mg/dL 478  94   295  97   Triglycerides 0 - 149 mg/dL 621  308   657  90   Creatinine 0.57 - 1.00 mg/dL 8.46  9.62  9.52  8.41  0.86       08/22/2023    8:56 AM 08/02/2023    2:35 PM 07/09/2023    2:22 PM 04/30/2023    8:31 AM 04/18/2023    8:34 AM 02/21/2023    9:42 AM 02/07/2023   10:05 AM  BP/Weight  Systolic BP 122 128 127 129 138 129 --  Diastolic BP 79 80 79 89 77 78 --  Wt. (Lbs) 181 181 182 180 182.12 184.04 182  BMI 32.58 kg/m2 32.58 kg/m2 32.76 kg/m2 31.89 kg/m2 32.26 kg/m2 32.6 kg/m2 32.24 kg/m2       No data to display          Updated lab needed at/ before next visit.

## 2023-08-23 NOTE — Assessment & Plan Note (Signed)
 Controlled, no change in medication

## 2023-08-23 NOTE — Assessment & Plan Note (Signed)
 Controlled, no change in medication DASH diet and commitment to daily physical activity for a minimum of 30 minutes discussed and encouraged, as a part of hypertension management. The importance of attaining a healthy weight is also discussed.     08/22/2023    8:56 AM 08/02/2023    2:35 PM 07/09/2023    2:22 PM 04/30/2023    8:31 AM 04/18/2023    8:34 AM 02/21/2023    9:42 AM 02/07/2023   10:05 AM  BP/Weight  Systolic BP 122 128 127 129 138 129 --  Diastolic BP 79 80 79 89 77 78 --  Wt. (Lbs) 181 181 182 180 182.12 184.04 182  BMI 32.58 kg/m2 32.58 kg/m2 32.76 kg/m2 31.89 kg/m2 32.26 kg/m2 32.6 kg/m2 32.24 kg/m2

## 2023-08-23 NOTE — Assessment & Plan Note (Signed)
 Updated lab needed at/ before next visit.

## 2023-09-03 ENCOUNTER — Telehealth: Payer: Self-pay | Admitting: Pulmonary Disease

## 2023-09-03 DIAGNOSIS — G4733 Obstructive sleep apnea (adult) (pediatric): Secondary | ICD-10-CM | POA: Diagnosis not present

## 2023-09-03 NOTE — Telephone Encounter (Signed)
 Spoke with patient regarding sleep study result's   Sleep study result   Date of study: 08/16/2023   Impression: Moderate obstructive sleep apnea with AHI of 26.9, moderate oxygen  desaturations   Recommendation:   Consider options of treatment for sleep disordered breathing including CPAP therapy   Evaluation for hypoglossal nerve stimulator may also be considered   Encouraged continuing weight loss measures  Patient does have a f/u with Kristin Hall 09/28/2023.  Patient's voice was understanding. Nothing else further needed.

## 2023-09-03 NOTE — Telephone Encounter (Signed)
 Call patient  Sleep study result  Date of study: 08/16/2023  Impression: Moderate obstructive sleep apnea with AHI of 26.9, moderate oxygen  desaturations  Recommendation:  Consider options of treatment for sleep disordered breathing including CPAP therapy  Evaluation for hypoglossal nerve stimulator may also be considered  Encouraged continuing weight loss measures

## 2023-09-04 ENCOUNTER — Other Ambulatory Visit: Payer: Self-pay

## 2023-09-04 DIAGNOSIS — R748 Abnormal levels of other serum enzymes: Secondary | ICD-10-CM

## 2023-09-04 DIAGNOSIS — K760 Fatty (change of) liver, not elsewhere classified: Secondary | ICD-10-CM

## 2023-09-05 ENCOUNTER — Telehealth: Payer: Self-pay

## 2023-09-05 DIAGNOSIS — G4733 Obstructive sleep apnea (adult) (pediatric): Secondary | ICD-10-CM | POA: Insufficient documentation

## 2023-09-05 NOTE — Telephone Encounter (Signed)
 I have notified the patient. She would like to go ahead with the [facility] device.

## 2023-09-05 NOTE — Telephone Encounter (Signed)
-----   Message from Roetta Clarke sent at 09/05/2023  1:26 PM EDT ----- Moderate OSA. Please let patient know and see if she would like to go with Inspire, as we discussed previously, or retry CPAP. Thanks!

## 2023-09-06 DIAGNOSIS — R748 Abnormal levels of other serum enzymes: Secondary | ICD-10-CM | POA: Diagnosis not present

## 2023-09-06 DIAGNOSIS — K760 Fatty (change of) liver, not elsewhere classified: Secondary | ICD-10-CM | POA: Diagnosis not present

## 2023-09-06 NOTE — Telephone Encounter (Signed)
 I have scheduled procedure with endoscopy on 5/6 at 8:30 AM Please let patient know. N.p.o. after midnight

## 2023-09-06 NOTE — Telephone Encounter (Signed)
 Pt with moderate OSA, intolerant of CPAP despite multiple mask trials in past. She has a family member with Inspire. Reviewed risks/benefits are prior OV. She would like to move forward with Inspire workup. Can you please review case and set her up for DISE?   Thanks!

## 2023-09-07 ENCOUNTER — Encounter: Payer: Self-pay | Admitting: Pulmonary Disease

## 2023-09-09 LAB — ALKALINE PHOSPHATASE, ISOENZYMES
Alkaline Phosphatase: 157 IU/L — ABNORMAL HIGH (ref 44–121)
BONE FRACTION: 15 % (ref 14–68)
INTESTINAL FRAC.: 2 % (ref 0–18)
LIVER FRACTION: 83 % (ref 18–85)

## 2023-09-09 LAB — NASH FIBROSURE(R) PLUS
ALPHA 2-MACROGLOBULINS, QN: 171 mg/dL (ref 110–276)
ALT (SGPT) P5P: 18 IU/L (ref 0–40)
AST (SGOT) P5P: 28 IU/L (ref 0–40)
Apolipoprotein A-1: 136 mg/dL (ref 116–209)
Bilirubin, Total: 0.4 mg/dL (ref 0.0–1.2)
Cholesterol, Total: 162 mg/dL (ref 100–199)
Fibrosis Score: 0.1 (ref 0.00–0.21)
GGT: 21 IU/L (ref 0–60)
Glucose: 124 mg/dL — ABNORMAL HIGH (ref 70–99)
Haptoglobin: 380 mg/dL — ABNORMAL HIGH (ref 37–355)
NASH Score: 0.57 — ABNORMAL HIGH (ref 0.00–0.25)
Steatosis Score: 0.68 — ABNORMAL HIGH (ref 0.00–0.40)
Triglycerides: 190 mg/dL — ABNORMAL HIGH (ref 0–149)

## 2023-09-18 ENCOUNTER — Other Ambulatory Visit: Payer: Self-pay

## 2023-09-18 ENCOUNTER — Encounter (HOSPITAL_COMMUNITY): Payer: Self-pay | Admitting: Pulmonary Disease

## 2023-09-18 ENCOUNTER — Ambulatory Visit (HOSPITAL_COMMUNITY)
Admission: RE | Admit: 2023-09-18 | Discharge: 2023-09-18 | Disposition: A | Discharge status: Home / Self Care | Attending: Pulmonary Disease | Admitting: Pulmonary Disease

## 2023-09-18 ENCOUNTER — Telehealth: Payer: Self-pay | Admitting: Pulmonary Disease

## 2023-09-18 ENCOUNTER — Ambulatory Visit (HOSPITAL_COMMUNITY): Admitting: Anesthesiology

## 2023-09-18 ENCOUNTER — Encounter (HOSPITAL_COMMUNITY)
Admission: RE | Disposition: A | Discharge status: Home / Self Care | Payer: Self-pay | Source: Home / Self Care | Attending: Pulmonary Disease

## 2023-09-18 DIAGNOSIS — I10 Essential (primary) hypertension: Secondary | ICD-10-CM | POA: Insufficient documentation

## 2023-09-18 DIAGNOSIS — Z6832 Body mass index (BMI) 32.0-32.9, adult: Secondary | ICD-10-CM | POA: Diagnosis not present

## 2023-09-18 DIAGNOSIS — K219 Gastro-esophageal reflux disease without esophagitis: Secondary | ICD-10-CM | POA: Diagnosis not present

## 2023-09-18 DIAGNOSIS — G4733 Obstructive sleep apnea (adult) (pediatric): Secondary | ICD-10-CM | POA: Insufficient documentation

## 2023-09-18 DIAGNOSIS — E669 Obesity, unspecified: Secondary | ICD-10-CM | POA: Insufficient documentation

## 2023-09-18 HISTORY — PX: DRUG INDUCED ENDOSCOPY: SHX6808

## 2023-09-18 SURGERY — DRUG INDUCED SLEEP ENDOSCOPY
Anesthesia: Monitor Anesthesia Care | Laterality: Bilateral

## 2023-09-18 MED ORDER — PROPOFOL 500 MG/50ML IV EMUL
INTRAVENOUS | Status: DC | PRN
  Administered 2023-09-18: 20 mg via INTRAVENOUS
  Administered 2023-09-18: 100 ug/kg/min via INTRAVENOUS
  Administered 2023-09-18: 10 mg via INTRAVENOUS

## 2023-09-18 MED ORDER — PHENYLEPHRINE HCL 0.25 % NA SOLN
1.0000 | Freq: Four times a day (QID) | NASAL | Status: DC | PRN
  Filled 2023-09-18: qty 15

## 2023-09-18 MED ORDER — GLYCOPYRROLATE 0.2 MG/ML IJ SOLN
INTRAMUSCULAR | Status: DC | PRN
  Administered 2023-09-18: .1 mg via INTRAVENOUS

## 2023-09-18 MED ORDER — SODIUM CHLORIDE 0.9 % IV SOLN: INTRAVENOUS | Status: DC | PRN

## 2023-09-18 NOTE — H&P (Signed)
 HPI: 68 year old female, never smoker referred for sleep consult.  Past medical history significant for hypertension, OSA on CPAP, GERD, diastolic dysfunction, HLD, obesity.   TEST/EVENTS:  06/20/2017 NPSG: AHI 27.3/h, SpO2 low 76%.  Moderate OSA; weight 183 lb   08/02/2023: Today - sleep consult Discussed the use of AI scribe software for clinical note transcription with the patient, who gave verbal consent to proceed.   History of Present Illness   Kristin Hall is a 68 year old female with sleep apnea who presents for sleep consult    She has not used her CPAP machine for at least a year. She finds the CPAP machine uncomfortable, particularly due to the headgear and difficulty sleeping on her back. The machine is approximately four years old.    She acknowledges snoring at night as reported by others but denies waking up gasping for air or experiencing morning headaches. No drowsy driving or falling asleep while driving, but she does experience daytime sleepiness, typically taking a nap between 2 and 4 PM daily.   Her sleep apnea was classified as moderate during her initial sleep study, with an average of 27 events per hour. She has maintained a stable weight over the past five years, with no significant changes.  She would like to discuss alternative options for management of her sleep apnea.  She does have a cousin who got the inspire device and she is interested in this.   She goes to bed between 9 and 11 PM.  Typically takes her about an hour to fall asleep.  Wakes 2-3 times a night.  Gets up around 4 AM.  She is retired.  Weight has been stable.  Last sleep study was in 2019.  Does not use any supplemental oxygen .   She is a never smoker.  Does not drink alcohol.  No excessive caffeine intake.  Lives by herself.  Family history of cancer.   Epworth 6        Allergies  No Active Allergies         Immunization History  Administered Date(s) Administered   Fluad Quad(high Dose  65+) 02/08/2022, 01/30/2023   Influenza Split 02/09/2014   Influenza Whole 06/05/2007   Influenza,inj,Quad PF,6+ Mos 02/18/2018, 01/14/2020, 02/11/2021   Influenza-Unspecified 02/02/2017   Moderna Covid-19 Vaccine Bivalent Booster 33yrs & up 01/30/2023   Moderna SARS-COV2 Booster Vaccination 02/08/2022   Moderna Sars-Covid-2 Vaccination 06/07/2019, 07/10/2019, 03/25/2020   PNEUMOCOCCAL CONJUGATE-20 12/20/2021   Td 08/15/2006   Tdap 10/02/2016   Zoster Recombinant(Shingrix ) 02/19/2017, 10/29/2017          Past Medical History:  Diagnosis Date   Elevated alkaline phosphatase level     Headache     Hyperlipidemia     Hypertension     OSA on CPAP     Thrombocytosis 07/23/2015          Tobacco History: Tobacco Use History  Social History       Tobacco Use  Smoking Status Never  Smokeless Tobacco Never      Counseling given: Not Answered           Outpatient Medications Prior to Visit  Medication Sig Dispense Refill   amLODipine  (NORVASC ) 2.5 MG tablet TAKE 1 TABLET(2.5 MG) BY MOUTH DAILY 90 tablet 3   calcium citrate (CALCITRATE - DOSED IN MG ELEMENTAL CALCIUM) 950 (200 Ca) MG tablet Take 1,200 mg of elemental calcium by mouth daily. Otc for calcium       diltiazem  (TIAZAC ) 300  MG 24 hr capsule TAKE 1 CAPSULE(300 MG) BY MOUTH DAILY 90 capsule 3   ezetimibe -simvastatin  (VYTORIN ) 10-10 MG tablet TAKE 1 TABLET BY MOUTH AT BEDTIME FOR CHOLESTEROL 90 tablet 3   famotidine  (PEPCID ) 40 MG tablet Take 40 mg by mouth daily.       gabapentin  (NEURONTIN ) 100 MG capsule TAKE 1 TO 2 CAPSULES BY MOUTH AT BEDTIME FOR 2 WEEKS THEN AS NEEDED 40 capsule 2   meloxicam  (MOBIC ) 15 MG tablet Take one tablet once daily as needed, for uncontrolled back  pain 30 tablet 0   Multiple Vitamins-Minerals (ALIVE WOMENS 50+) TABS Take 1 tablet by mouth daily.       olmesartan -hydrochlorothiazide  (BENICAR  HCT) 40-25 MG tablet TAKE 1 TABLET BY MOUTH DAILY 90 tablet 3   pantoprazole  (PROTONIX ) 40 MG  tablet TAKE 1 TABLET(40 MG) BY MOUTH DAILY 90 tablet 3   potassium chloride  SA (KLOR-CON  M) 20 MEQ tablet TAKE 1 TABLET(20 MEQ) BY MOUTH TWICE DAILY 180 tablet 3   predniSONE  (DELTASONE ) 20 MG tablet Take 1 tablet (20 mg total) by mouth 2 (two) times daily with a meal. (Patient not taking: Reported on 07/09/2023) 10 tablet 0      No facility-administered medications prior to visit.          Review of Systems:    Constitutional: No weight loss or gain, night sweats, fevers, chills, or lassitude. +fatigue  HEENT: No headaches, difficulty swallowing, tooth/dental problems, or sore throat. No sneezing, itching, ear ache, nasal congestion, or post nasal drip CV:  No chest pain, orthopnea, PND, swelling in lower extremities, anasarca, dizziness, palpitations, syncope Resp: +snoring. No shortness of breath with exertion or at rest. No excess mucus or change in color of mucus. No productive or non-productive. No hemoptysis. No wheezing.  No chest wall deformity GI:  No heartburn, indigestion GU: +nocturia. No dysuria, daytime frequency, hematuria  Skin: No rash, lesions, ulcerations MSK:  No joint pain or swelling.   Neuro: No dizziness or lightheadedness.  Psych: No depression or anxiety. Mood stable. +sleep disturbance        Physical Exam:   BP 128/80 (BP Location: Left Arm, Cuff Size: Normal)   Pulse 89   Ht 5' 2.5" (1.588 m)   Wt 181 lb (82.1 kg)   SpO2 98%   BMI 32.58 kg/m    GEN: Pleasant, interactive, well-appearing; obese; in no acute distress HEENT:  Normocephalic and atraumatic. PERRLA. Sclera white. Nasal turbinates pink, moist and patent bilaterally. No rhinorrhea present. Oropharynx pink and moist, without exudate or edema. No lesions, ulcerations, or postnasal drip. Mallampati II NECK:  Supple w/ fair ROM. Thyroid  symmetrical with no goiter or nodules palpated. No lymphadenopathy.   CV: RRR, no m/r/g, no peripheral edema. Pulses intact, +2 bilaterally. No cyanosis, pallor  or clubbing. PULMONARY:  Unlabored, regular breathing. Clear bilaterally A&P w/o wheezes/rales/rhonchi. No accessory muscle use.  GI: BS present and normoactive. Soft, non-tender to palpation. No organomegaly or masses detected. MSK: No erythema, warmth or tenderness. Cap refil <2 sec all extrem. No deformities or joint swelling noted.  Neuro: A/Ox3. No focal deficits noted.   Skin: Warm, no lesions or rashe Psych: Normal affect and behavior. Judgement and thought content appropriate.        Lab Results:   CBC Labs (Brief)          Component Value Date/Time    WBC 11.2 (H) 10/15/2022 0815    RBC 4.59 10/15/2022 0815    HGB 13.4 10/15/2022  0815    HGB 13.4 08/18/2022 0941    HCT 41.8 10/15/2022 0815    HCT 40.5 08/18/2022 0941    PLT 424 (H) 10/15/2022 0815    PLT 417 08/18/2022 0941    MCV 91.1 10/15/2022 0815    MCV 88 08/18/2022 0941    MCH 29.2 10/15/2022 0815    MCHC 32.1 10/15/2022 0815    RDW 13.0 10/15/2022 0815    RDW 12.4 08/18/2022 0941    LYMPHSABS 2.6 10/15/2022 0815    MONOABS 0.8 10/15/2022 0815    EOSABS 0.0 10/15/2022 0815    BASOSABS 0.0 10/15/2022 0815        BMET Labs (Brief)          Component Value Date/Time    NA 141 02/21/2023 1039    K 4.0 02/21/2023 1039    CL 101 02/21/2023 1039    CO2 26 02/21/2023 1039    GLUCOSE 96 02/21/2023 1039    GLUCOSE 116 (H) 10/15/2022 0815    BUN 11 02/21/2023 1039    CREATININE 0.87 02/21/2023 1039    CREATININE 0.82 02/15/2018 1013    CALCIUM 10.1 02/21/2023 1039    GFRNONAA >60 10/15/2022 0815    GFRNONAA 77 02/15/2018 1013    GFRAA 82 06/17/2020 0933    GFRAA 90 02/15/2018 1013        BNP Labs (Brief)  No results found for: "BNP"       Imaging:    Imaging Results  US  ELASTOGRAPHY LIVER Result Date: 07/20/2023 CLINICAL DATA:  Elevated alkaline phosphatase. EXAM: US  ELASTOGRAPHY HEPATIC TECHNIQUE: Sonography of the liver was performed. In addition, ultrasound elastography evaluation of the  liver was performed. A region of interest was placed within the right lobe of the liver. Following application of a compressive sonographic pulse, tissue compressibility was assessed. Multiple assessments were performed at the selected site. Median tissue compressibility was determined. Previously, hepatic stiffness was assessed by shear wave velocity. Based on recently published Society of Radiologists in Ultrasound consensus article, reporting is now recommended to be performed in the SI units of pressure (kiloPascals) representing hepatic stiffness/elasticity. The obtained result is compared to the published reference standards. (cACLD = compensated Advanced Chronic Liver Disease) COMPARISON:  CT 11/24/2022.  Ultrasound 07/16/2020. FINDINGS: Liver: Diffusely echogenic hepatic parenchyma consistent with fatty liver infiltration. Portal vein is patent on color Doppler imaging with normal direction of blood flow towards the liver. ULTRASOUND HEPATIC ELASTOGRAPHY Device: Siemens Helix VTQ Patient position: Supine Transducer: 9C2 Number of measurements: 10 Hepatic segment:  8 Median kPa: 11.9 IQR: 10.2 IQR/Median kPa ratio: 0.86 Data quality: i. IQR/Median kPa ratio of 0.6 or greater when using the DAX probe indicates reduced accuracy Diagnostic category: >9 kPa and ?13 kPa: suggestive of cACLD, but needs further testing The use of hepatic elastography is applicable to patients with viral hepatitis and non-alcoholic fatty liver disease. At this time, there is insufficient data for the referenced cut-off values and use in other causes of liver disease, including alcoholic liver disease. Patients, however, may be assessed by elastography and serve as their own reference standard/baseline. In patients with non-alcoholic liver disease, the values suggesting compensated advanced chronic liver disease (cACLD) may be lower, and patients may need additional testing with elasticity results of 7-9 kPa. Please note that abnormal  hepatic elasticity and shear wave velocities may also be identified in clinical settings other than with hepatic fibrosis, such as: acute hepatitis, elevated right heart and central venous pressures including use of  beta blockers, veno-occlusive disease (Budd-Chiari), infiltrative processes such as mastocytosis/amyloidosis/infiltrative tumor/lymphoma, extrahepatic cholestasis, with hyperemia in the post-prandial state, and with liver transplantation. Correlation with patient history, laboratory data, and clinical condition recommended. Diagnostic Categories: < or =5 kPa: high probability of being normal < or =9 kPa: in the absence of other known clinical signs, rules out cACLD >9 kPa and ?13 kPa: suggestive of cACLD, but needs further testing >13 kPa: highly suggestive of cACLD > or =17 kPa: highly suggestive of cACLD with an increased probability of clinically significant portal hypertension IMPRESSION: ULTRASOUND LIVER: Fatty liver infiltration ULTRASOUND HEPATIC ELASTOGRAPHY: Median kPa:  11.9 Diagnostic category: >9 kPa and ?13 kPa: suggestive of cACLD, but needs further testing Electronically Signed   By: Adrianna Horde M.D.   On: 07/20/2023 15:02       Administration History       None                 No data to display             Recent Labs  No results found for: "NITRICOXIDE"             Assessment & Plan:    OSA on CPAP She has a history of moderate sleep apnea, previously on CPAP.  Has had issues with tolerating CPAP and interested in alternative therapies.  We reviewed risks of untreated moderate to severe sleep apnea and potential treatment options.  She is interested in inspire device.  Discussed risk/benefits of procedure.  We will need to start by reassessing the severity of her sleep apnea since her last sleep study was 5 years ago.  Home sleep study ordered today.  If she continues to have moderate to severe sleep apnea, we will get her set up for drug-induced sleep  endoscopy to see if she would qualify for inspire.  If she is cleared after this, we will refer her to ENT for placement.  Safe during practices reviewed.  Healthy weight loss encouraged.   Patient Instructions  Given your symptoms, I am concerned that you still have sleep disordered breathing with sleep apnea. You will need a sleep study for further evaluation. Someone will contact you to schedule this.    We discussed how untreated sleep apnea puts an individual at risk for cardiac arrhthymias, pulm HTN, DM, stroke and increases their risk for daytime accidents. We also briefly reviewed treatment options including weight loss, side sleeping position, oral appliance, CPAP therapy or referral to ENT for possible surgical options   Use caution when driving and pull over if you become sleepy.   If you still have moderate to severe sleep apnea, we will work you up for [facility] device  If you have mild sleep apnea, we could consider oral appliance or positional sleeping    Follow up in 6 weeks with Katie Cobb,NP to go over sleep study results, or sooner, if needed. Friday PM virtual clinic preferred      Kristin Hall has presented today for procedure, with the diagnosis of OSA. The various methods of treatment have been discussed with the patient and family. After consideration of risks, benefits and other options for treatment, the patient has consented to Procedure(s) :  DISE .  The patient's history has been reviewed, patient examined, no change in status, stable for surgery. I have reviewed the patient's chart and labs. Questions were answered to the patient's satisfaction   Kristin Pullin V. Villa Greaser MD

## 2023-09-18 NOTE — Discharge Instructions (Signed)
 ENT referral for inspire implantation

## 2023-09-18 NOTE — Telephone Encounter (Signed)
 She had AP collapse on DISE Proceed with ENT consultation

## 2023-09-18 NOTE — Op Note (Signed)
 Procedure: Evaluation of sleep-disordered breathing by examination of upper airway using an endoscope  CPT Codes: 16109 Evaluation of sleep-disordered breathing by examination of upper airway using an endoscope  Pre-Op Diagnose: Moderate /Severe obstructive sleep apnea with positive airway pressure intolerance (ICD-10 G47.33).  Post-Op Diagnosis: Moderate /Severe obstructive sleep apnea with positive pressure airway intolerance (ICD-10 G47.33).  ANESTHESIA: IV propofol sedation.  ESTIMATED BLOOD LOSS: None.  COMPLICATIONS: None.  BRIEF CLINICAL HISTORY: This is a 68 year old patient with a history of moderate to severe symptomatic obstructive sleep apnea, who is intolerant and unable to achieve benefit with positive pressure therapy.She  presents today for drug-induced sleep endoscopy to better characterize her locations and pattern of obstruction and to predict appropriate medical and/or surgical options moving forward.  PROCEDURE FINDINGS: There was no evidence of complete concentric palatal obstruction and she is a candidate anatomically for hypoglossal nerve stimulation therapy.  DESCRIPTION OF PROCEDURE: The patient was brought to the endoscopy room and was anesthetized via the standard drug-induced sleep endoscopy protocol. The propofol infusion rate was started at 75 mcg and gradually increased at which point, conditions that mimic sleep were gradually observed.   With the patient not responsive to verbal commands, but still with spontaneous respiration, sleep disordered breathing events and associated desaturations were clearly observed  Under these conditions, the flexible endoscope was inserted to examine both sides of the nose as well as the pharynx and larynx.  The VOTE score at baseline was complete AP , partial AP , partial AP, partial AP.  With simulated jaw advancement and tongue advancement, the hypopharyngeal obstruction and secondarily the palatal collapse also  improved.  See pictures in media tab  In summary, there was no evidence of complete concentric palatal obstruction and she is a candidate anatomically for hypoglossal nerve stimulation therapy.  I was present for and performed the entire procedure.  Dictated By: Jennifer Moellers Villa Greaser MD  Post-Op Plan: ENT evaluation for implantation  Diagnostic Codes: G47.33 Obstructive sleep apnea (adult)    Willard Farquharson V. Villa Greaser MD

## 2023-09-18 NOTE — Telephone Encounter (Signed)
 I called and spoke with the pt and notified of recommendations for Katie. She was okay with cancelling appt for 09/28/23. This was done. She will call back to reschedule once she finds out when her Belva Boyden will be implanted. Nothing further needed.

## 2023-09-18 NOTE — Telephone Encounter (Signed)
 Pt has appt with me 5/16. She is undergoing workup for Inspire; recently passed DISE. Awaiting ENT evaluation. We can reschedule this for post Inspire implantation with ENT. Thanks!

## 2023-09-18 NOTE — Transfer of Care (Signed)
 Immediate Anesthesia Transfer of Care Note  Patient: Kristin Hall  Procedure(s) Performed: DRUG INDUCED SLEEP ENDOSCOPY (Bilateral)  Patient Location: Endoscopy Unit  Anesthesia Type:MAC  Level of Consciousness: awake and alert   Airway & Oxygen  Therapy: Patient Spontanous Breathing and Patient connected to nasal cannula oxygen   Post-op Assessment: Report given to RN and Post -op Vital signs reviewed and stable  Post vital signs: Reviewed and stable  Last Vitals:  Vitals Value Taken Time  BP    Temp    Pulse    Resp    SpO2      Last Pain:  Vitals:   09/18/23 0713  TempSrc: Temporal  PainSc: 0-No pain         Complications: No notable events documented.

## 2023-09-18 NOTE — Anesthesia Preprocedure Evaluation (Signed)
 Anesthesia Evaluation  Patient identified by MRN, date of birth, ID band Patient awake    Reviewed: Allergy & Precautions, H&P , NPO status , Patient's Chart, lab work & pertinent test results  Airway Mallampati: II   Neck ROM: full    Dental   Pulmonary sleep apnea    breath sounds clear to auscultation       Cardiovascular hypertension,  Rhythm:regular Rate:Normal     Neuro/Psych  Headaches    GI/Hepatic ,GERD  ,,  Endo/Other    Renal/GU      Musculoskeletal   Abdominal   Peds  Hematology   Anesthesia Other Findings   Reproductive/Obstetrics                             Anesthesia Physical Anesthesia Plan  ASA: 2  Anesthesia Plan: MAC   Post-op Pain Management:    Induction: Intravenous  PONV Risk Score and Plan: 2 and Propofol infusion and Treatment may vary due to age or medical condition  Airway Management Planned: Nasal Cannula  Additional Equipment:   Intra-op Plan:   Post-operative Plan:   Informed Consent: I have reviewed the patients History and Physical, chart, labs and discussed the procedure including the risks, benefits and alternatives for the proposed anesthesia with the patient or authorized representative who has indicated his/her understanding and acceptance.     Dental advisory given  Plan Discussed with: CRNA, Anesthesiologist and Surgeon  Anesthesia Plan Comments:        Anesthesia Quick Evaluation

## 2023-09-19 NOTE — Anesthesia Postprocedure Evaluation (Signed)
 Anesthesia Post Note  Patient: Kristin Hall  Procedure(s) Performed: DRUG INDUCED SLEEP ENDOSCOPY (Bilateral)     Patient location during evaluation: PACU Anesthesia Type: MAC Level of consciousness: awake and alert Pain management: pain level controlled Vital Signs Assessment: post-procedure vital signs reviewed and stable Respiratory status: spontaneous breathing, nonlabored ventilation, respiratory function stable and patient connected to nasal cannula oxygen  Cardiovascular status: stable and blood pressure returned to baseline Postop Assessment: no apparent nausea or vomiting Anesthetic complications: no   No notable events documented.  Last Vitals:  Vitals:   09/18/23 0920 09/18/23 0921  BP: 107/71   Pulse: 78 80  Resp: (!) 23 13  Temp:    SpO2: 93% 94%    Last Pain:  Vitals:   09/18/23 0921  TempSrc:   PainSc: 0-No pain                 [name] S

## 2023-09-20 ENCOUNTER — Encounter (HOSPITAL_COMMUNITY): Payer: Self-pay | Admitting: Pulmonary Disease

## 2023-09-28 ENCOUNTER — Telehealth: Admitting: Nurse Practitioner

## 2023-10-01 ENCOUNTER — Telehealth: Payer: Self-pay | Admitting: Family Medicine

## 2023-10-01 NOTE — Telephone Encounter (Signed)
 Family Member FMLA Noted Copied Scanned Original in provider box Copy at front desk

## 2023-10-10 NOTE — Telephone Encounter (Signed)
 Form picked up

## 2023-10-11 ENCOUNTER — Telehealth: Payer: Self-pay

## 2023-10-11 NOTE — Telephone Encounter (Signed)
 Tried calling pt son, Kristin Hall, to let him know we have the FMLA paper work finished and have seen trying to fax to the number that was provided for the last several days but keeps not going through. Need to know if he would like to pick up paper work, have it emailed to him, or if he has alternate fax number. Fax that was provided was [phone], but not working.

## 2023-10-19 ENCOUNTER — Telehealth (INDEPENDENT_AMBULATORY_CARE_PROVIDER_SITE_OTHER): Payer: Self-pay

## 2023-10-19 ENCOUNTER — Ambulatory Visit (INDEPENDENT_AMBULATORY_CARE_PROVIDER_SITE_OTHER): Admitting: Otolaryngology

## 2023-10-19 ENCOUNTER — Telehealth: Payer: Self-pay | Admitting: Pulmonary Disease

## 2023-10-19 ENCOUNTER — Encounter (INDEPENDENT_AMBULATORY_CARE_PROVIDER_SITE_OTHER): Payer: Self-pay | Admitting: Otolaryngology

## 2023-10-19 VITALS — BP 145/73 | HR 99 | Wt 177.6 lb

## 2023-10-19 DIAGNOSIS — G4733 Obstructive sleep apnea (adult) (pediatric): Secondary | ICD-10-CM

## 2023-10-19 DIAGNOSIS — Z91198 Patient's noncompliance with other medical treatment and regimen for other reason: Secondary | ICD-10-CM

## 2023-10-19 DIAGNOSIS — Z789 Other specified health status: Secondary | ICD-10-CM

## 2023-10-19 NOTE — Progress Notes (Signed)
 ENT CONSULT:  Reason for Consult: OSA CPAP intolerance    HPI: Discussed the use of AI scribe software for clinical note transcription with the patient, who gave verbal consent to proceed.  History of Present Illness Kristin Hall is a 68 year old female with sleep apnea who presents for consideration of an Inspire implant. She was referred by Dr. Villa Greaser for evaluation of sleep apnea and consideration of an Inspire implant after her DISE done on 09/18/23 did not show any evidence of complete concentric collapse at the soft palate.   She has a history of sleep apnea diagnosed over five years ago. She experiences significant difficulty with CPAP therapy, describing it as uncomfortable and causing breathing difficulties at night. The CPAP mask causes soreness in her nose and discomfort due to her preference for sleeping on her side. She discontinued CPAP use approximately one year ago due to these issues. Not compliant currently.   No history of lung disease, heart attacks, strokes, or arrhythmias. No use of blood thinners and no history of insomnia requiring sleeping aids.   Records Reviewed:  DISE Op Note Dr Villa Greaser 09/18/23 The VOTE score at baseline was complete AP , partial AP , partial AP, partial AP.  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   See pictures in media tab   In summary, there was no evidence of complete concentric palatal obstruction and she is a candidate anatomically for hypoglossal nerve stimulation therapy.    Past Medical History:  Diagnosis Date   Elevated alkaline phosphatase level    Headache    Hyperlipidemia    Hypertension    OSA on CPAP    Thrombocytosis 07/23/2015    Past Surgical History:  Procedure Laterality Date   ABDOMINAL HYSTERECTOMY     partial    COLONOSCOPY  10/04/2006   SLF: Normal retroflexed view of the rectum/Normal colon without evidence of  polyps, masses, inflammatory changes, diverticula or arteriovenous malformations   COLONOSCOPY N/A 12/07/2016   Dr. Nolene Baumgarten: 15 mm polyp in cecum, s/p APC and clips. One 30 mm polyp in sigmoid, s/p clip, injection, tattooed. Path with one adenoma and one hamartomatous polyp. Surveillance 2021.    COLONOSCOPY N/A 09/08/2019   Procedure: COLONOSCOPY;  Surgeon: Alyce Jubilee, MD;  [facility]: [facility];  Service: Endoscopy;  Laterality: N/A;  8:30AM   DRUG INDUCED ENDOSCOPY Bilateral 09/18/2023   Procedure: DRUG INDUCED SLEEP ENDOSCOPY;  Surgeon: Lind Repine, MD;  Location: [facility] ENDOSCOPY;  Service: Pulmonary;  Laterality: Bilateral;   POLYPECTOMY  12/07/2016   Procedure: POLYPECTOMY;  Surgeon: Alyce Jubilee, MD;  Location: [facility];  Service: Endoscopy;;  cecal;   POLYPECTOMY  09/08/2019   Procedure: POLYPECTOMY;  Surgeon: Alyce Jubilee, MD;  Location: [facility];  Service: Endoscopy;;    Family History  Problem Relation Age of Onset   Cancer Mother        Breast   Dementia Mother    Cancer Father        Lung Cancer, deceased at 50   Colon cancer Neg Hx    Liver disease Neg Hx     Social History:  reports that she has never smoked. She has never used smokeless tobacco. She reports that she does not drink alcohol and does not use drugs.  Allergies: No Known Allergies  Medications: I have reviewed the patient's current medications.  The PMH, PSH, Medications, Allergies, and  SH were reviewed and updated.  ROS: Constitutional: Negative for fever, weight loss and weight gain. Cardiovascular: Negative for chest pain and dyspnea on exertion. Respiratory: Is not experiencing shortness of breath at rest. Gastrointestinal: Negative for nausea and vomiting. Neurological: Negative for headaches. Psychiatric: The patient is not nervous/anxious  Blood pressure (!) 145/73, pulse 99, weight 177 lb 9.6 oz (80.6 kg), SpO2 93%. Body mass index is 31.97 kg/m.  PHYSICAL  EXAM:  Exam: General: Well-developed, well-nourished Respiratory Respiratory effort: Equal inspiration and expiration without stridor Cardiovascular Peripheral Vascular: Warm extremities with equal color/perfusion Eyes: No nystagmus with equal extraocular motion bilaterally Neuro/Psych/Balance: Patient oriented to person, place, and time; Appropriate mood and affect; Gait is intact with no imbalance; Cranial nerves I-XII are intact Head and Face Inspection: Normocephalic and atraumatic without mass or lesion Palpation: Facial skeleton intact without bony stepoffs Salivary Glands: No mass or tenderness Facial Strength: Facial motility symmetric and full bilaterally ENT Pinna: External ear intact and fully developed External canal: Canal is patent with intact skin Tympanic Membrane: Clear and mobile External Nose: No scar or anatomic deformity Internal Nose: Septum is relatively straight. No polyp, or purulence. Mucosal edema and erythema present.  Bilateral inferior turbinate hypertrophy.  Lips, Teeth, and gums: Mucosa and teeth intact and viable TMJ: No pain to palpation with full mobility Oral cavity/oropharynx: No erythema or exudate, no lesions present Friedman IV tongue position 1+ tonsils b/l Nasopharynx: No mass or lesion with intact mucosa Hypopharynx: Intact mucosa without pooling of secretions Larynx Glottic: Full true vocal cord mobility without lesion or mass Supraglottic: Normal appearing epiglottis and AE folds Interarytenoid Space: Moderate pachydermia&edema Subglottic Space: Patent without lesion or edema Neck Neck and Trachea: Midline trachea without mass or lesion Thyroid : No mass or nodularity Lymphatics: No lymphadenopathy  Procedure: Preoperative diagnosis: OSA and CPAP intolerance   Postoperative diagnosis:   Same  Procedure: Flexible fiberoptic laryngoscopy  Surgeon: Artice Last, MD  Anesthesia: Topical lidocaine  and Afrin Complications:  None Condition is stable throughout exam  Indications and consent:  The patient presents to the clinic with above symptoms. Indirect laryngoscopy view was incomplete. Thus it was recommended that they undergo a flexible fiberoptic laryngoscopy. All of the risks, benefits, and potential complications were reviewed with the patient preoperatively and verbal informed consent was obtained.  Procedure: The patient was seated upright in the clinic. Topical lidocaine  and Afrin were applied to the nasal cavity. After adequate anesthesia had occurred, I then proceeded to pass the flexible telescope into the nasal cavity. The nasal cavity was patent without rhinorrhea or polyp. The nasopharynx was also patent without mass or lesion. The base of tongue was visualized and was normal. There were no signs of pooling of secretions in the piriform sinuses. The true vocal folds were mobile bilaterally. There were no signs of glottic or supraglottic mucosal lesion or mass. There was moderate interarytenoid pachydermia and post cricoid edema. The telescope was then slowly withdrawn and the patient tolerated the procedure throughout.   Studies Reviewed: HST SNAP 08/16/23  AHI of 26.9 BMI of 32 (no central apneas)  Assessment/Plan: Encounter Diagnoses  Name Primary?   OSA (obstructive sleep apnea) Yes   Intolerance of continuous positive airway pressure (CPAP) ventilation     Assessment and Plan Assessment & Plan Obstructive Sleep Apnea CPAP intolerance  Chronic obstructive sleep apnea with CPAP intolerance. Recent sleep study via SNAP done at home with AHI of 26.9 and no central events, BMI of 32. She has [employer], which  requires in-lab sleep study to be done within 5 yrs and her last in-lab test was 04/2018. Will need to complete in-lab sleep study prior pre-auth submission. Scheduled for it with Dr Villa Greaser on 12/26/23.   OSA, moderate to severe, without multilevel collapse, with failure to tolerate PAP therapy  and/or more conservative measures. Presence of smaller/absent tonsils and larger tongue position (Friedman tongue position or modified Mallampati) suggests that hypopharyngeal/retrolingual collapse is contributing to the patient's OSA. Reather Campbell, M et al. Staging of obstructive sleep apnea/hypopnea syndrome: a guide to appropriate treatment. Laryngoscope, 2004 Mar, 114(3):454-9. PMID: 16109604) Options including positional therapy, weight loss, oral appliances, PAP and surgical correction discussed. Pt is not an ideal candidate for oral appliance due to severity of OSA Pt is a candidate for Hypoglossal nerve stimulation (Inspire therapy) based on DISE done by Dr Villa Greaser   - Schedule Inspire implant surgery after 12/26/23 when she will undergo her in-lab sleep study     Thank you for allowing me to participate in the care of this patient. Please do not hesitate to contact me with any questions or concerns.   Artice Last, MD Otolaryngology [facility] Phone: [phone] Fax: [phone]    10/19/2023, 10:38 AM

## 2023-10-19 NOTE — Telephone Encounter (Signed)
 Patient is scheduled in lab sleep study for 8/13. Nothing further is needed at this time.

## 2023-10-19 NOTE — Telephone Encounter (Signed)
 based on her insurance plan she has to have in-lab sleep study within last 5 yrs, her in-lab sleep study was done 12/20219 > 5 yrs ago -  HST scanned 08/16/23 with [facility] 27/h  Have ordered

## 2023-10-29 NOTE — Telephone Encounter (Signed)
 Done

## 2023-12-05 NOTE — Progress Notes (Signed)
 Surgical Instructions   Your procedure is scheduled on Friday December 14, 2023. Report to [facility] Main Entrance A at 11:00 A.M., then check in with the Admitting office. Any questions or running late day of surgery: call [phone]  Questions prior to your surgery date: call [phone], Monday-Friday, 8am-4pm. If you experience any cold or flu symptoms such as cough, fever, chills, shortness of breath, etc. between now and your scheduled surgery, please notify us  at the above number.     Remember:  Do not eat after midnight the night before your surgery   You may drink clear liquids until 10:00 the morning of your surgery.   Clear liquids allowed are: Water , Non-Citrus Juices (without pulp), Carbonated Beverages, Clear Tea (no milk, honey, etc.), Black Coffee Only (NO MILK, CREAM OR POWDERED CREAMER of any kind), and Gatorade.    Take these medicines the morning of surgery with A SIP OF WATER   amLODipine  (NORVASC )  diltiazem  (TIAZAC )  pantoprazole  (PROTONIX )   May take these medicines IF NEEDED: famotidine  (PEPCID )    One week prior to surgery, STOP taking any Aspirin (unless otherwise instructed by your surgeon) Aleve, Naproxen, Ibuprofen , Motrin , Advil , Goody's, BC's, all herbal medications, fish oil, and non-prescription vitamins.                     Do NOT Smoke (Tobacco/Vaping) for 24 hours prior to your procedure.  If you use a CPAP at night, you may bring your mask/headgear for your overnight stay.   You will be asked to remove any contacts, glasses, piercing's, hearing aid's, dentures/partials prior to surgery. Please bring cases for these items if needed.    Patients discharged the day of surgery will not be allowed to drive home, and someone needs to stay with them for 24 hours.  SURGICAL WAITING ROOM VISITATION Patients may have no more than 2 support people in the waiting area - these visitors may rotate.   Pre-op nurse will coordinate an appropriate time  for 1 ADULT support person, who may not rotate, to accompany patient in pre-op.  Children under the age of 66 must have an adult with them who is not the patient and must remain in the main waiting area with an adult.  If the patient needs to stay at the hospital during part of their recovery, the visitor guidelines for inpatient rooms apply.  Please refer to the [facility] website for the visitor guidelines for any additional information.   If you received a COVID test during your pre-op visit  it is requested that you wear a mask when out in public, stay away from anyone that may not be feeling well and notify your surgeon if you develop symptoms. If you have been in contact with anyone that has tested positive in the last 10 days please notify you surgeon.      Pre-operative CHG Bathing Instructions   You can play a key role in reducing the risk of infection after surgery. Your skin needs to be as free of germs as possible. You can reduce the number of germs on your skin by washing with CHG (chlorhexidine gluconate) soap before surgery. CHG is an antiseptic soap that kills germs and continues to kill germs even after washing.   DO NOT use if you have an allergy to chlorhexidine/CHG or antibacterial soaps. If your skin becomes reddened or irritated, stop using the CHG and notify one of our RNs at [phone].  TAKE A SHOWER THE NIGHT BEFORE SURGERY AND THE DAY OF SURGERY    Please keep in mind the following:  DO NOT shave, including legs and underarms, 48 hours prior to surgery.   Place clean sheets on your bed the night before surgery Use a clean washcloth (not used since being washed) for each shower. DO NOT sleep with pet's night before surgery.  CHG Shower Instructions:  Wash your face and private area with normal soap. If you choose to wash your hair, wash first with your normal shampoo.  After you use shampoo/soap, rinse your hair and body thoroughly to remove  shampoo/soap residue.  Turn the water  OFF and apply half the bottle of CHG soap to a CLEAN washcloth.  Apply CHG soap ONLY FROM YOUR NECK DOWN TO YOUR TOES (washing for 3-5 minutes)  DO NOT use CHG soap on face, private areas, open wounds, or sores.  Pay special attention to the area where your surgery is being performed.  If you are having back surgery, having someone wash your back for you may be helpful. Wait 2 minutes after CHG soap is applied, then you may rinse off the CHG soap.  Pat dry with a clean towel  Put on clean pajamas    Additional instructions for the day of surgery: DO NOT APPLY any lotions, deodorants or perfumes.   Do not wear jewelry or makeup Do not wear nail polish, gel polish, artificial nails, or any other type of covering on natural nails (fingers and toes) Do not bring valuables to the hospital. [facility] is not responsible for valuables/personal belongings. Put on clean/comfortable clothes.  Please brush your teeth.  Ask your nurse before applying any prescription medications to the skin.

## 2023-12-06 ENCOUNTER — Encounter (HOSPITAL_COMMUNITY): Payer: Self-pay

## 2023-12-06 ENCOUNTER — Other Ambulatory Visit: Payer: Self-pay

## 2023-12-06 ENCOUNTER — Encounter (HOSPITAL_COMMUNITY)
Admission: RE | Admit: 2023-12-06 | Discharge: 2023-12-06 | Disposition: A | Discharge status: Home / Self Care | Source: Ambulatory Visit | Attending: Otolaryngology | Admitting: Otolaryngology

## 2023-12-06 VITALS — BP 132/59 | HR 78 | Temp 98.5°F | Resp 17 | Ht 62.5 in | Wt 178.3 lb

## 2023-12-06 DIAGNOSIS — D75839 Thrombocytosis, unspecified: Secondary | ICD-10-CM | POA: Insufficient documentation

## 2023-12-06 DIAGNOSIS — Z01818 Encounter for other preprocedural examination: Secondary | ICD-10-CM | POA: Insufficient documentation

## 2023-12-06 DIAGNOSIS — E785 Hyperlipidemia, unspecified: Secondary | ICD-10-CM | POA: Insufficient documentation

## 2023-12-06 DIAGNOSIS — K760 Fatty (change of) liver, not elsewhere classified: Secondary | ICD-10-CM | POA: Insufficient documentation

## 2023-12-06 DIAGNOSIS — G4733 Obstructive sleep apnea (adult) (pediatric): Secondary | ICD-10-CM | POA: Insufficient documentation

## 2023-12-06 DIAGNOSIS — I10 Essential (primary) hypertension: Secondary | ICD-10-CM | POA: Insufficient documentation

## 2023-12-06 DIAGNOSIS — R748 Abnormal levels of other serum enzymes: Secondary | ICD-10-CM | POA: Insufficient documentation

## 2023-12-06 DIAGNOSIS — Z01812 Encounter for preprocedural laboratory examination: Secondary | ICD-10-CM | POA: Diagnosis present

## 2023-12-06 DIAGNOSIS — Z01810 Encounter for preprocedural cardiovascular examination: Secondary | ICD-10-CM | POA: Diagnosis present

## 2023-12-06 LAB — BASIC METABOLIC PANEL WITH GFR
Anion gap: 10 (ref 5–15)
BUN: 8 mg/dL (ref 8–23)
CO2: 28 mmol/L (ref 22–32)
Calcium: 10.5 mg/dL — ABNORMAL HIGH (ref 8.9–10.3)
Chloride: 103 mmol/L (ref 98–111)
Creatinine, Ser: 0.88 mg/dL (ref 0.44–1.00)
GFR, Estimated: >60 mL/min (ref >60)
Glucose, Bld: 119 mg/dL — ABNORMAL HIGH (ref 70–99)
Potassium: 4.1 mmol/L (ref 3.5–5.1)
Sodium: 141 mmol/L (ref 135–145)

## 2023-12-06 LAB — CBC
HCT: 42.4 % (ref 36.0–46.0)
Hemoglobin: 13.5 g/dL (ref 12.0–15.0)
MCH: 29 pg (ref 26.0–34.0)
MCHC: 31.8 g/dL (ref 30.0–36.0)
MCV: 91.2 fL (ref 80.0–100.0)
Platelets: 431 K/uL — ABNORMAL HIGH (ref 150–400)
RBC: 4.65 MIL/uL (ref 3.87–5.11)
RDW: 12.9 % (ref 11.5–15.5)
WBC: 10.8 K/uL — ABNORMAL HIGH (ref 4.0–10.5)
nRBC: 0 % (ref 0.0–0.2)

## 2023-12-06 NOTE — Progress Notes (Addendum)
 PCP - Dr. Rollene Pesa Cardiologist - denies, pt did see Dr. Jeffrie 03/14/21, but said she does not routinely see cardiology  PPM/ICD - denies   Chest x-ray - 10/15/22 EKG - 12/06/23 Stress Test - denies ECHO - 04/25/21 Cardiac Cath - denies  Sleep Study - OSA+ CPAP - denies  DM- denies  Last dose of GLP1 agonist-  n/a   ASA/Blood Thinner Instructions: n/a   ERAS Protcol - no, NPO   COVID TEST- n/a   Anesthesia review: yes, saw cardiology 2022  Patient denies shortness of breath, fever, cough and chest pain at PAT appointment   All instructions explained to the patient, with a verbal understanding of the material. Patient agrees to go over the instructions while at home for a better understanding. The opportunity to ask questions was provided.

## 2023-12-06 NOTE — Progress Notes (Signed)
 Surgical Instructions   Your procedure is scheduled on Friday December 14, 2023. Report to [facility] Main Entrance A at 11:00 A.M., then check in with the Admitting office. Any questions or running late day of surgery: call [phone]  Questions prior to your surgery date: call [phone], Monday-Friday, 8am-4pm. If you experience any cold or flu symptoms such as cough, fever, chills, shortness of breath, etc. between now and your scheduled surgery, please notify us  at the above number.     Remember:  Do not eat or drink after midnight the night before your surgery     Take these medicines the morning of surgery with A SIP OF WATER   amLODipine  (NORVASC )  diltiazem  (TIAZAC )  pantoprazole  (PROTONIX )   May take these medicines IF NEEDED: famotidine  (PEPCID )    One week prior to surgery, STOP taking any Aspirin (unless otherwise instructed by your surgeon) Aleve, Naproxen, Ibuprofen , Motrin , Advil , Goody's, BC's, all herbal medications, fish oil, and non-prescription vitamins.                     Do NOT Smoke (Tobacco/Vaping) for 24 hours prior to your procedure.  If you use a CPAP at night, you may bring your mask/headgear for your overnight stay.   You will be asked to remove any contacts, glasses, piercing's, hearing aid's, dentures/partials prior to surgery. Please bring cases for these items if needed.    Patients discharged the day of surgery will not be allowed to drive home, and someone needs to stay with them for 24 hours.  SURGICAL WAITING ROOM VISITATION Patients may have no more than 2 support people in the waiting area - these visitors may rotate.   Pre-op nurse will coordinate an appropriate time for 1 ADULT support person, who may not rotate, to accompany patient in pre-op.  Children under the age of 94 must have an adult with them who is not the patient and must remain in the main waiting area with an adult.  If the patient needs to stay at the hospital during  part of their recovery, the visitor guidelines for inpatient rooms apply.  Please refer to the [facility] website for the visitor guidelines for any additional information.   If you received a COVID test during your pre-op visit  it is requested that you wear a mask when out in public, stay away from anyone that may not be feeling well and notify your surgeon if you develop symptoms. If you have been in contact with anyone that has tested positive in the last 10 days please notify you surgeon.      Pre-operative CHG Bathing Instructions   You can play a key role in reducing the risk of infection after surgery. Your skin needs to be as free of germs as possible. You can reduce the number of germs on your skin by washing with CHG (chlorhexidine gluconate) soap before surgery. CHG is an antiseptic soap that kills germs and continues to kill germs even after washing.   DO NOT use if you have an allergy to chlorhexidine/CHG or antibacterial soaps. If your skin becomes reddened or irritated, stop using the CHG and notify one of our RNs at [phone].              TAKE A SHOWER THE NIGHT BEFORE SURGERY AND THE DAY OF SURGERY    Please keep in mind the following:  DO NOT shave, including legs and underarms, 48 hours prior to surgery.   Place  clean sheets on your bed the night before surgery Use a clean washcloth (not used since being washed) for each shower. DO NOT sleep with pet's night before surgery.  CHG Shower Instructions:  Wash your face and private area with normal soap. If you choose to wash your hair, wash first with your normal shampoo.  After you use shampoo/soap, rinse your hair and body thoroughly to remove shampoo/soap residue.  Turn the water  OFF and apply half the bottle of CHG soap to a CLEAN washcloth.  Apply CHG soap ONLY FROM YOUR NECK DOWN TO YOUR TOES (washing for 3-5 minutes)  DO NOT use CHG soap on face, private areas, open wounds, or sores.  Pay special attention to  the area where your surgery is being performed.  If you are having back surgery, having someone wash your back for you may be helpful. Wait 2 minutes after CHG soap is applied, then you may rinse off the CHG soap.  Pat dry with a clean towel  Put on clean pajamas    Additional instructions for the day of surgery: DO NOT APPLY any lotions, deodorants or perfumes.   Do not wear jewelry or makeup Do not wear nail polish, gel polish, artificial nails, or any other type of covering on natural nails (fingers and toes) Do not bring valuables to the hospital. [facility] is not responsible for valuables/personal belongings. Put on clean/comfortable clothes.  Please brush your teeth.  Ask your nurse before applying any prescription medications to the skin.

## 2023-12-07 ENCOUNTER — Encounter: Payer: Self-pay | Admitting: Gastroenterology

## 2023-12-07 NOTE — Anesthesia Preprocedure Evaluation (Addendum)
 Anesthesia Evaluation  Patient identified by MRN, date of birth, ID band Patient awake    Reviewed: Allergy & Precautions, H&P , NPO status , Patient's Chart, lab work & pertinent test results  Airway Mallampati: II   Neck ROM: full    Dental  (+) Missing, Dental Advisory Given   Pulmonary sleep apnea and Continuous Positive Airway Pressure Ventilation    breath sounds clear to auscultation       Cardiovascular hypertension,  Rhythm:regular Rate:Normal     Neuro/Psych  Headaches  Neuromuscular disease    GI/Hepatic ,GERD  Medicated,,  Endo/Other    Renal/GU      Musculoskeletal   Abdominal   Peds  Hematology   Anesthesia Other Findings   Reproductive/Obstetrics                              Anesthesia Physical Anesthesia Plan  ASA: 2  Anesthesia Plan: General   Post-op Pain Management: Tylenol  PO (pre-op)*   Induction: Intravenous  PONV Risk Score and Plan: 2 and Propofol  infusion and Treatment may vary due to age or medical condition  Airway Management Planned: Oral ETT  Additional Equipment: None  Intra-op Plan:   Post-operative Plan: Extubation in OR  Informed Consent: I have reviewed the patients History and Physical, chart, labs and discussed the procedure including the risks, benefits and alternatives for the proposed anesthesia with the patient or authorized representative who has indicated his/her understanding and acceptance.     Dental advisory given  Plan Discussed with: CRNA, Anesthesiologist and Surgeon  Anesthesia Plan Comments: (PAT note written 12/07/2023 by Isaiah Ruder, PA-C.    DISCUSSION: Patient is a 68 year old female scheduled for the above procedure. S/p MD sleep endoscopy on 09/18/2023.   History includes never smoker, HTN, HLD, OSA (does not use CPAP), thrombocytosis, elevated alkaline phosphatase (since at least 2018; fatty liver 06/2023 US , no  fibrosis by [employer]roSure 09/06/23),  EKG: EKG 12/06/23: Normal sinus rhythm Possible Left atrial enlargement Borderline ECG When compared with ECG of 15-Oct-2022 08:06, No significant change was found Confirmed by Perla Lye [phone]) on 12/06/2023 9:02:11 PM     CV: Echo 04/25/21: IMPRESSIONS   1. Left ventricular ejection fraction, by estimation, is 60 to 65%. The  left ventricle has normal function. The left ventricle has no regional  wall motion abnormalities. There is mild left ventricular hypertrophy.  Left ventricular diastolic parameters  are indeterminate. Elevated left ventricular end-diastolic pressure.   2. Right ventricular systolic function is normal. The right ventricular  size is normal. Tricuspid regurgitation signal is inadequate for assessing  PA pressure.   3. The mitral valve is grossly normal. Trivial mitral valve  regurgitation.   4. The aortic valve is tricuspid. Aortic valve regurgitation is trivial.   5. The inferior vena cava is normal in size with greater than 50%  respiratory variability, suggesting right atrial pressure of 3 mmHg.  - Comparison(s): No prior Echocardiogram.   US  Carotid 06/19/16:  IMPRESSION: - Minimal atherosclerotic disease in the carotid arteries. No significant carotid artery stenosis. Estimated degree of stenosis in the internal carotid arteries is less than 50% bilaterally. - Patent vertebral arteries.  )        Anesthesia Quick Evaluation

## 2023-12-07 NOTE — Progress Notes (Signed)
 Anesthesia Chart Review:  Case: [id number] Date/Time: 12/14/23 1245   Procedure: INSERTION, HYPOGLOSSAL NERVE STIMULATOR   Anesthesia type: General   Diagnosis: Obstructive sleep apnea [G47.33]   Pre-op diagnosis: Obstructive sleep apnea   Location: [facility] ROOM 11 / [facility] OR   Surgeons: Okey Burns, MD       DISCUSSION: [name] is a 68 year old female scheduled for Kristin above procedure. S/p MD sleep endoscopy on 09/18/2023.  History includes never smoker, HTN, HLD, OSA (does not use CPAP), thrombocytosis, elevated alkaline phosphatase (since at least 2018; fatty liver 06/2023 US , no fibrosis by [facility] 09/06/23),   Anesthesia team evaluation on Kristin day of surgery.    VS: BP (!) 132/59   Pulse 78   Temp 36.9 C   Resp 17   Ht 5' 2.5 (1.588 m)   Wt 80.9 kg   SpO2 96%   BMI 32.09 kg/m   PROVIDERS: Antonetta Rollene BRAVO, MD is PCP  Jude Donning, MD is pulmonologist Cindie Dunnings, DO is GI - She is not followed routinely by cardiology.  Last evaluation 03/14/2021 with Jeffrie Anes, MD primarily for HTN and grade 1 diastolic dysfunction with recommendation to update echocardiogram and treat hypertension. 04/2021 TTE showed LVEF 60 to 65%, no regional wall motion abnormalities, mild LVH, indeterminate diastolic parameters, normal RV systolic function, trivial MR/AR.   LABS: Preoperative labs noted. A1c 6.1% on 08/22/23. PLT count 431K. Ca 10.5 (previously 10.4 on 08/22/23 per PCP labs).  (all labs ordered are listed, but only abnormal results are displayed)  Labs Reviewed  BASIC METABOLIC PANEL WITH GFR - Abnormal; Notable for Kristin following components:      Result Value   Glucose, Bld 119 (*)    Calcium 10.5 (*)    All other components within normal limits  CBC - Abnormal; Notable for Kristin following components:   WBC 10.8 (*)    Platelets 431 (*)    All other components within normal limits    Home Sleep Study 08/16/2023: Impression: Moderate obstructive sleep apnea with AHI of  26.9, moderate oxygen  desaturations Recommendation: - Consider options of treatment for sleep disordered breathing including CPAP therapy - Evaluation for hypoglossal nerve stimulator may also be considered - Encouraged continuing weight loss measures    IMAGES: US  Elastography Liver 07/11/23: IMPRESSION: ULTRASOUND LIVER: Fatty liver infiltration ULTRASOUND HEPATIC ELASTOGRAPHY: Median kPa:  11.9 Diagnostic category: >9 kPa and ?13 kPa: suggestive of cACLD, but needs further testing - 09/06/23 NASH FibroSure(R) Plus ordered: FibroSure shows F0 meaning no fibrosis   EKG: EKG 12/06/23: Normal sinus rhythm Possible Left atrial enlargement Borderline ECG When compared with ECG of 15-Oct-2022 08:06, No significant change was found Confirmed by Perla Lye [phone]) on 12/06/2023 9:02:11 PM   CV: Echo 04/25/21: IMPRESSIONS   1. Left ventricular ejection fraction, by estimation, is 60 to 65%. Kristin  left ventricle has normal function. Kristin left ventricle has no regional  wall motion abnormalities. There is mild left ventricular hypertrophy.  Left ventricular diastolic parameters  are indeterminate. Elevated left ventricular end-diastolic pressure.   2. Right ventricular systolic function is normal. Kristin right ventricular  size is normal. Tricuspid regurgitation signal is inadequate for assessing  PA pressure.   3. Kristin mitral valve is grossly normal. Trivial mitral valve  regurgitation.   4. Kristin aortic valve is tricuspid. Aortic valve regurgitation is trivial.   5. Kristin inferior vena cava is normal in size with greater than 50%  respiratory variability, suggesting right atrial pressure of 3 mmHg.  -  Comparison(s): No prior Echocardiogram.  US  Carotid 06/19/16:  IMPRESSION: - Minimal atherosclerotic disease in Kristin carotid arteries. No significant carotid artery stenosis. Estimated degree of stenosis in Kristin internal carotid arteries is less than 50% bilaterally. - Patent vertebral  arteries.  Past Medical History:  Diagnosis Date   Elevated alkaline phosphatase level    Headache    Hyperlipidemia    Hypertension    OSA on CPAP    Thrombocytosis 07/23/2015    Past Surgical History:  Procedure Laterality Date   ABDOMINAL HYSTERECTOMY     partial, left ovaries   COLONOSCOPY  10/04/2006   SLF: Normal retroflexed view of Kristin rectum/Normal colon without evidence of polyps, masses, inflammatory changes, diverticula or arteriovenous malformations   COLONOSCOPY N/A 12/07/2016   Dr. Harvey: 15 mm polyp in cecum, s/p APC and clips. One 30 mm polyp in sigmoid, s/p clip, injection, tattooed. Path with one adenoma and one hamartomatous polyp. Surveillance 2021.    COLONOSCOPY N/A 09/08/2019   Procedure: COLONOSCOPY;  Surgeon: Harvey Margo CROME, MD;  Location: [facility];  Service: Endoscopy;  Laterality: N/A;  8:30AM   DRUG INDUCED ENDOSCOPY Bilateral 09/18/2023   Procedure: DRUG INDUCED SLEEP ENDOSCOPY;  Surgeon: Jude Harden GAILS, MD;  Location: [facility] ENDOSCOPY;  Service: Pulmonary;  Laterality: Bilateral;   POLYPECTOMY  12/07/2016   Procedure: POLYPECTOMY;  Surgeon: Harvey Margo CROME, MD;  Location: [facility];  Service: Endoscopy;;  cecal;   POLYPECTOMY  09/08/2019   Procedure: POLYPECTOMY;  Surgeon: Harvey Margo CROME, MD;  Location: [facility];  Service: Endoscopy;;    MEDICATIONS:  amLODipine  (NORVASC ) 2.5 MG tablet   Calcium-Magnesium-Vitamin D  (CALCIUM 1200+D3 PO)   diltiazem  (TIAZAC ) 300 MG 24 hr capsule   ezetimibe -simvastatin  (VYTORIN ) 10-10 MG tablet   famotidine  (PEPCID ) 40 MG tablet   Multiple Vitamin (MULTIVITAMIN WITH MINERALS) TABS tablet   olmesartan -hydrochlorothiazide  (BENICAR  HCT) 40-25 MG tablet   pantoprazole  (PROTONIX ) 40 MG tablet   potassium chloride  SA (KLOR-CON  M) 20 MEQ tablet   No current facility-administered medications for this encounter.   Isaiah Ruder, PA-C Surgical Short Stay/Anesthesiology [facility] Phone [phone] [facility] Phone  [phone] 12/07/2023 2:32 PM

## 2023-12-10 ENCOUNTER — Encounter (INDEPENDENT_AMBULATORY_CARE_PROVIDER_SITE_OTHER): Payer: Self-pay | Admitting: Otolaryngology

## 2023-12-14 ENCOUNTER — Ambulatory Visit (HOSPITAL_COMMUNITY): Admitting: Certified Registered Nurse Anesthetist

## 2023-12-14 ENCOUNTER — Ambulatory Visit (HOSPITAL_COMMUNITY)

## 2023-12-14 ENCOUNTER — Ambulatory Visit (HOSPITAL_COMMUNITY): Payer: Self-pay | Admitting: Vascular Surgery

## 2023-12-14 ENCOUNTER — Ambulatory Visit (HOSPITAL_COMMUNITY)
Admission: RE | Admit: 2023-12-14 | Discharge: 2023-12-14 | Disposition: A | Discharge status: Home / Self Care | Attending: Otolaryngology | Admitting: Otolaryngology

## 2023-12-14 ENCOUNTER — Encounter (HOSPITAL_COMMUNITY)
Admission: RE | Disposition: A | Discharge status: Home / Self Care | Payer: Self-pay | Source: Home / Self Care | Attending: Otolaryngology

## 2023-12-14 ENCOUNTER — Other Ambulatory Visit: Payer: Self-pay

## 2023-12-14 DIAGNOSIS — G4733 Obstructive sleep apnea (adult) (pediatric): Secondary | ICD-10-CM | POA: Diagnosis not present

## 2023-12-14 DIAGNOSIS — Z6833 Body mass index (BMI) 33.0-33.9, adult: Secondary | ICD-10-CM | POA: Insufficient documentation

## 2023-12-14 DIAGNOSIS — I10 Essential (primary) hypertension: Secondary | ICD-10-CM | POA: Diagnosis not present

## 2023-12-14 DIAGNOSIS — Z6832 Body mass index (BMI) 32.0-32.9, adult: Secondary | ICD-10-CM

## 2023-12-14 DIAGNOSIS — E785 Hyperlipidemia, unspecified: Secondary | ICD-10-CM | POA: Diagnosis not present

## 2023-12-14 DIAGNOSIS — K219 Gastro-esophageal reflux disease without esophagitis: Secondary | ICD-10-CM | POA: Insufficient documentation

## 2023-12-14 DIAGNOSIS — Z9682 Presence of neurostimulator: Secondary | ICD-10-CM | POA: Diagnosis not present

## 2023-12-14 DIAGNOSIS — E66811 Obesity, class 1: Secondary | ICD-10-CM | POA: Diagnosis not present

## 2023-12-14 DIAGNOSIS — R0989 Other specified symptoms and signs involving the circulatory and respiratory systems: Secondary | ICD-10-CM | POA: Diagnosis not present

## 2023-12-14 HISTORY — PX: IMPLANTATION OF HYPOGLOSSAL NERVE STIMULATOR: SHX6827

## 2023-12-14 SURGERY — INSERTION, HYPOGLOSSAL NERVE STIMULATOR
Anesthesia: General | Site: Chest

## 2023-12-14 MED ORDER — LACTATED RINGERS IV SOLN: INTRAVENOUS | Status: DC

## 2023-12-14 MED ORDER — AMOXICILLIN-POT CLAVULANATE 875-125 MG PO TABS: 1.0000 | ORAL_TABLET | Freq: Two times a day (BID) | ORAL | 0 refills | Status: DC

## 2023-12-14 MED ORDER — 0.9 % SODIUM CHLORIDE (POUR BTL) OPTIME
TOPICAL | Status: DC | PRN
Start: 2023-12-14 — End: 2023-12-14
  Administered 2023-12-14: 1000 mL

## 2023-12-14 MED ORDER — DEXAMETHASONE SODIUM PHOSPHATE 10 MG/ML IJ SOLN
INTRAMUSCULAR | Status: AC
  Filled 2023-12-14: qty 1

## 2023-12-14 MED ORDER — LIDOCAINE 2% (20 MG/ML) 5 ML SYRINGE
INTRAMUSCULAR | Status: DC | PRN
  Administered 2023-12-14: 80 mg via INTRAVENOUS

## 2023-12-14 MED ORDER — PHENYLEPHRINE HCL-NACL 20-0.9 MG/250ML-% IV SOLN
INTRAVENOUS | Status: DC | PRN
  Administered 2023-12-14: 50 ug/min via INTRAVENOUS

## 2023-12-14 MED ORDER — FENTANYL CITRATE (PF) 250 MCG/5ML IJ SOLN
INTRAMUSCULAR | Status: AC
  Filled 2023-12-14: qty 5

## 2023-12-14 MED ORDER — SODIUM CHLORIDE 0.9 % IV SOLN
0.1500 ug/kg/min | INTRAVENOUS | Status: AC
  Administered 2023-12-14: .15 ug/kg/min via INTRAVENOUS
  Filled 2023-12-14: qty 2000

## 2023-12-14 MED ORDER — MIDAZOLAM HCL 2 MG/2ML IJ SOLN
INTRAMUSCULAR | Status: AC
  Filled 2023-12-14: qty 2

## 2023-12-14 MED ORDER — LIDOCAINE-EPINEPHRINE 1 %-1:100000 IJ SOLN
INTRAMUSCULAR | Status: DC | PRN
  Administered 2023-12-14: 9 mL

## 2023-12-14 MED ORDER — EPHEDRINE SULFATE-NACL 50-0.9 MG/10ML-% IV SOSY
PREFILLED_SYRINGE | INTRAVENOUS | Status: DC | PRN
  Administered 2023-12-14: 5 mg via INTRAVENOUS
  Administered 2023-12-14: 7.5 mg via INTRAVENOUS

## 2023-12-14 MED ORDER — OXYCODONE HCL 5 MG PO TABS: 5.0000 mg | ORAL_TABLET | Freq: Three times a day (TID) | ORAL | 0 refills | Status: DC | PRN

## 2023-12-14 MED ORDER — SODIUM CHLORIDE 0.9 % IV SOLN
3.0000 g | INTRAVENOUS | Status: AC
  Administered 2023-12-14: 3 g via INTRAVENOUS
  Filled 2023-12-14: qty 8

## 2023-12-14 MED ORDER — MIDAZOLAM HCL 2 MG/2ML IJ SOLN
INTRAMUSCULAR | Status: DC | PRN
  Administered 2023-12-14: 2 mg via INTRAVENOUS

## 2023-12-14 MED ORDER — ONDANSETRON HCL 4 MG/2ML IJ SOLN
INTRAMUSCULAR | Status: AC
  Filled 2023-12-14: qty 2

## 2023-12-14 MED ORDER — ACETAMINOPHEN 500 MG PO TABS: 1000.0000 mg | ORAL_TABLET | Freq: Once | ORAL | Status: AC

## 2023-12-14 MED ORDER — PROPOFOL 10 MG/ML IV BOLUS
INTRAVENOUS | Status: AC
  Filled 2023-12-14: qty 20

## 2023-12-14 MED ORDER — OXYCODONE HCL 5 MG/5ML PO SOLN: 5.0000 mg | Freq: Once | ORAL | Status: DC | PRN

## 2023-12-14 MED ORDER — PHENYLEPHRINE 80 MCG/ML (10ML) SYRINGE FOR IV PUSH (FOR BLOOD PRESSURE SUPPORT)
PREFILLED_SYRINGE | INTRAVENOUS | Status: DC | PRN
  Administered 2023-12-14: 80 ug via INTRAVENOUS

## 2023-12-14 MED ORDER — ONDANSETRON HCL 4 MG/2ML IJ SOLN: 4.0000 mg | Freq: Once | INTRAMUSCULAR | Status: DC | PRN

## 2023-12-14 MED ORDER — ORAL CARE MOUTH RINSE: 15.0000 mL | Freq: Once | OROMUCOSAL | Status: AC

## 2023-12-14 MED ORDER — ACETAMINOPHEN 500 MG PO TABS: 500.0000 mg | ORAL_TABLET | Freq: Four times a day (QID) | ORAL | 0 refills | Status: AC

## 2023-12-14 MED ORDER — IBUPROFEN 600 MG PO TABS: 600.0000 mg | ORAL_TABLET | Freq: Four times a day (QID) | ORAL | 0 refills | Status: DC

## 2023-12-14 MED ORDER — CHLORHEXIDINE GLUCONATE 0.12 % MT SOLN: 15.0000 mL | Freq: Once | OROMUCOSAL | Status: AC

## 2023-12-14 MED ORDER — PROPOFOL 10 MG/ML IV BOLUS
INTRAVENOUS | Status: DC | PRN
  Administered 2023-12-14: 100 mg via INTRAVENOUS

## 2023-12-14 MED ORDER — FENTANYL CITRATE (PF) 100 MCG/2ML IJ SOLN: 25.0000 ug | INTRAMUSCULAR | Status: DC | PRN

## 2023-12-14 MED ORDER — LIDOCAINE-EPINEPHRINE 1 %-1:100000 IJ SOLN
INTRAMUSCULAR | Status: AC
  Filled 2023-12-14: qty 1

## 2023-12-14 MED ORDER — DEXAMETHASONE SODIUM PHOSPHATE 10 MG/ML IJ SOLN
INTRAMUSCULAR | Status: DC | PRN
  Administered 2023-12-14: 10 mg via INTRAVENOUS

## 2023-12-14 MED ORDER — MEPERIDINE HCL 25 MG/ML IJ SOLN: 6.2500 mg | INTRAMUSCULAR | Status: DC | PRN

## 2023-12-14 MED ORDER — ACETAMINOPHEN 500 MG PO TABS
ORAL_TABLET | ORAL | Status: AC
  Administered 2023-12-14: 1000 mg via ORAL
  Filled 2023-12-14: qty 2

## 2023-12-14 MED ORDER — LIDOCAINE 2% (20 MG/ML) 5 ML SYRINGE
INTRAMUSCULAR | Status: AC
  Filled 2023-12-14: qty 5

## 2023-12-14 MED ORDER — SUCCINYLCHOLINE CHLORIDE 200 MG/10ML IV SOSY
PREFILLED_SYRINGE | INTRAVENOUS | Status: AC
  Filled 2023-12-14: qty 10

## 2023-12-14 MED ORDER — CHLORHEXIDINE GLUCONATE 0.12 % MT SOLN
OROMUCOSAL | Status: AC
  Administered 2023-12-14: 15 mL via OROMUCOSAL
  Filled 2023-12-14: qty 15

## 2023-12-14 MED ORDER — OXYCODONE HCL 5 MG PO TABS: 5.0000 mg | ORAL_TABLET | Freq: Once | ORAL | Status: DC | PRN

## 2023-12-14 MED ORDER — ONDANSETRON HCL 4 MG/2ML IJ SOLN
INTRAMUSCULAR | Status: DC | PRN
  Administered 2023-12-14: 4 mg via INTRAVENOUS

## 2023-12-14 MED ORDER — SUCCINYLCHOLINE CHLORIDE 200 MG/10ML IV SOSY
PREFILLED_SYRINGE | INTRAVENOUS | Status: DC | PRN
  Administered 2023-12-14: 120 mg via INTRAVENOUS

## 2023-12-14 MED ORDER — EPHEDRINE 5 MG/ML INJ
INTRAVENOUS | Status: AC
  Filled 2023-12-14: qty 5

## 2023-12-14 MED ORDER — FENTANYL CITRATE (PF) 250 MCG/5ML IJ SOLN
INTRAMUSCULAR | Status: DC | PRN
  Administered 2023-12-14: 50 ug via INTRAVENOUS
  Administered 2023-12-14: 100 ug via INTRAVENOUS

## 2023-12-14 SURGICAL SUPPLY — 60 items
BAG COUNTER SPONGE SURGICOUNT (BAG) ×1 IMPLANT
BAG DECANTER FOR FLEXI CONT (MISCELLANEOUS) ×1 IMPLANT
BLADE CLIPPER SURG (BLADE) IMPLANT
BLADE SURG 15 STRL LF DISP TIS (BLADE) ×3 IMPLANT
CANISTER SUCTION 3000ML PPV (SUCTIONS) ×1 IMPLANT
CORD BIPOLAR FORCEPS 12FT (ELECTRODE) ×1 IMPLANT
COVER PROBE W GEL 5X96 (DRAPES) ×1 IMPLANT
COVER SURGICAL LIGHT HANDLE (MISCELLANEOUS) ×1 IMPLANT
DERMABOND ADVANCED .7 DNX12 (GAUZE/BANDAGES/DRESSINGS) ×2 IMPLANT
DRAPE C-ARM 35X43 STRL (DRAPES) ×1 IMPLANT
DRAPE INCISE IOBAN 66X45 STRL (DRAPES) ×1 IMPLANT
DRAPE MICROSCOPE LEICA 54X105 (DRAPES) ×1 IMPLANT
DRSG TEGADERM 4X4.75 (GAUZE/BANDAGES/DRESSINGS) ×3 IMPLANT
ELECT COATED BLADE 2.86 ST (ELECTRODE) ×1 IMPLANT
ELECTRODE 4 CHANNEL SET (MISCELLANEOUS) IMPLANT
ELECTRODE EMG 18 NIMS (NEUROSURGERY SUPPLIES) ×1 IMPLANT
ELECTRODE REM PT RTRN 9FT ADLT (ELECTROSURGICAL) ×1 IMPLANT
GAUZE 4X4 16PLY ~~LOC~~+RFID DBL (SPONGE) ×1 IMPLANT
GAUZE SPONGE 4X4 12PLY STRL (GAUZE/BANDAGES/DRESSINGS) ×1 IMPLANT
GENERATOR PULSE INSPIRE (Generator) ×1 IMPLANT
GENERATOR PULSE INSPIRE IV (Generator) ×1 IMPLANT
GLOVE BIO SURGEON STRL SZ 6 (GLOVE) ×1 IMPLANT
GLOVE BIO SURGEON STRL SZ7.5 (GLOVE) ×1 IMPLANT
GOWN STRL REUS W/ TWL LRG LVL3 (GOWN DISPOSABLE) ×3 IMPLANT
KIT BASIN OR (CUSTOM PROCEDURE TRAY) ×1 IMPLANT
KIT NEURO ACCESSORY W/WRENCH (MISCELLANEOUS) IMPLANT
KIT TURNOVER KIT B (KITS) ×1 IMPLANT
LEAD SENSING RESP INSPIRE (Lead) ×1 IMPLANT
LEAD SENSING RESP INSPIRE IV (Lead) ×1 IMPLANT
LEAD SLEEP STIM INSPIRE IV/V (Lead) ×1 IMPLANT
LEAD SLEEP STIMULATION INSPIRE (Lead) ×1 IMPLANT
LOOP VASCLR MAXI BLUE 18IN ST (MISCELLANEOUS) ×1 IMPLANT
LOOPS VASCLR MAXI BLUE 18IN ST (MISCELLANEOUS) ×1 IMPLANT
MARKER SKIN DUAL TIP RULER LAB (MISCELLANEOUS) ×2 IMPLANT
NDL HYPO 25GX1X1/2 BEV (NEEDLE) ×1 IMPLANT
NEEDLE HYPO 25GX1X1/2 BEV (NEEDLE) ×1 IMPLANT
NS IRRIG 1000ML POUR BTL (IV SOLUTION) ×1 IMPLANT
PAD ARMBOARD POSITIONER FOAM (MISCELLANEOUS) ×1 IMPLANT
PASSER CATH 38CM DISP (INSTRUMENTS) ×1 IMPLANT
PENCIL BUTTON HOLSTER BLD 10FT (ELECTRODE) ×1 IMPLANT
POSITIONER HEAD DONUT 9IN (MISCELLANEOUS) ×1 IMPLANT
PROBE NERVE STIMULATOR (NEUROSURGERY SUPPLIES) ×1 IMPLANT
REMOTE CONTROL SLEEP INSPIRE (MISCELLANEOUS) ×1 IMPLANT
RETRACTOR STAY HOOK 5MM (MISCELLANEOUS) ×1 IMPLANT
SET WALTER ACTIVATION W/DRAPE (SET/KITS/TRAYS/PACK) IMPLANT
SHEARS HARMONIC 9CM CVD (BLADE) ×1 IMPLANT
SPONGE INTESTINAL PEANUT (DISPOSABLE) ×3 IMPLANT
SUT MNCRL AB 4-0 PS2 18 (SUTURE) ×2 IMPLANT
SUT SILK 2 0 SH CR/8 (SUTURE) IMPLANT
SUT SILK 2 0 TIES 10X30 (SUTURE) IMPLANT
SUT SILK 3 0 TIES 10X30 (SUTURE) IMPLANT
SUT SILK 3 0SH CR/8 30 (SUTURE) IMPLANT
SUT VIC AB 3-0 SH 27X BRD (SUTURE) ×2 IMPLANT
SUTURE SILK 3-0 RB1 30XBRD (SUTURE) ×2 IMPLANT
SUTURE SILK PEM 2-0 1X30 RB-1 (SUTURE) ×2 IMPLANT
SYR 10ML LL (SYRINGE) ×1 IMPLANT
TAPE CLOTH SURG 4X10 WHT LF (GAUZE/BANDAGES/DRESSINGS) ×1 IMPLANT
TOWEL GREEN STERILE (TOWEL DISPOSABLE) ×1 IMPLANT
TRAY ENT MC OR (CUSTOM PROCEDURE TRAY) ×1 IMPLANT
VASCULAR TIE MINI RED 18IN STL (MISCELLANEOUS) ×1 IMPLANT

## 2023-12-14 NOTE — Anesthesia Postprocedure Evaluation (Signed)
 Anesthesia Post Note  Patient: Kristin Hall  Procedure(s) Performed: INSERTION, HYPOGLOSSAL NERVE STIMULATOR (Chest)     Patient location during evaluation: PACU Anesthesia Type: General Level of consciousness: awake and alert Pain management: pain level controlled Vital Signs Assessment: post-procedure vital signs reviewed and stable Respiratory status: spontaneous breathing, nonlabored ventilation, respiratory function stable and patient connected to nasal cannula oxygen  Cardiovascular status: blood pressure returned to baseline and stable Postop Assessment: no apparent nausea or vomiting Anesthetic complications: no   No notable events documented.  Last Vitals:  Vitals:   12/14/23 1415 12/14/23 1430  BP: 125/65 (!) 125/57  Pulse: 85 87  Resp: 16 16  Temp:  (!) 36.2 C  SpO2: 95% 97%    Last Pain:  Vitals:   12/14/23 1430  TempSrc:   PainSc: 0-No pain                 Joycelin Radloff

## 2023-12-14 NOTE — Transfer of Care (Signed)
 Immediate Anesthesia Transfer of Care Note  Patient: Kristin Hall  Procedure(s) Performed: INSERTION, HYPOGLOSSAL NERVE STIMULATOR (Chest)  Patient Location: PACU  Anesthesia Type:General  Level of Consciousness: awake, alert , and oriented  Airway & Oxygen  Therapy: Patient Spontanous Breathing and Patient connected to face mask oxygen   Post-op Assessment: Report given to RN and Post -op Vital signs reviewed and stable  Post vital signs: Reviewed and stable  Last Vitals:  Vitals Value Taken Time  BP 121/70 12/14/23 13:45  Temp 36.2 C 12/14/23 13:38  Pulse 95 12/14/23 13:51  Resp 16 12/14/23 13:51  SpO2 94 % 12/14/23 13:51  Vitals shown include unfiled device data.  Last Pain:  Vitals:   12/14/23 1338  TempSrc:   PainSc: 0-No pain         Complications: No notable events documented.

## 2023-12-14 NOTE — Anesthesia Procedure Notes (Signed)
 Procedure Name: Intubation Date/Time: 12/14/2023 12:12 PM  Performed by: Mannie Krystal LABOR, CRNAPre-anesthesia Checklist: Patient identified, Emergency Drugs available, Suction available and Patient being monitored Patient Re-evaluated:Patient Re-evaluated prior to induction Oxygen  Delivery Method: Circle system utilized Preoxygenation: Pre-oxygenation with 100% oxygen  Induction Type: IV induction and Rapid sequence Laryngoscope Size: Miller and 2 Grade View: Grade I Tube type: Oral Tube size: 7.0 mm Number of attempts: 1 Airway Equipment and Method: Stylet and Oral airway Placement Confirmation: ETT inserted through vocal cords under direct vision, positive ETCO2 and breath sounds checked- equal and bilateral Secured at: 22 cm Tube secured with: Tape Dental Injury: Teeth and Oropharynx as per pre-operative assessment

## 2023-12-14 NOTE — Discharge Instructions (Signed)
INSPIRE POST-OPERATIVE INSTRUCTIONS:   Please review post-operative and recovery instructions below that you will need to be aware of after Inspire Implant surgery.  Please restart all of your home medications if you take anything on a daily basis.  You can resume regular diet after this procedure.  You will be scheduled for an appointment with Dr. Irene Pap to review details about surgery and to discuss the next steps.   DIET: Resume normal diet HYGIENE: Please wait until 48 hours after surgery before getting incisions on neck, chest, and torso wet. In the first 48 hours after surgery, will likely need to take sponge baths. WOUND CARE: Please leave pressure dressing on for 48 hours after surgery. Gently place antibiotic ointment over incisions 2 times per day; use clean q-tip. May place a clean bandage over incisions as needed. After 48 hours, you may get incisions wet with warm soap and water, but do not soak the incisions.  Pat area dry gently.  Immediately place antibiotic ointment. Take oral antibiotics as prescribed If skin around incision starts to get red (> 1cm), swollen, and/or more painful, please call the office ACTIVITY: Try to avoid sleeping on the side of your surgery, to the extent possible.   You may walk for exercise starting the day after surgery. For 2 weeks: Do not pick up anything greater than 5 pounds with the hand/arm that's on the same side as the surgery.  After 2 weeks, you may increase weight to 10 pounds.   Consider performing neck rolls 10 clockwise and 10 counterclockwise 3x/day. For 4 weeks, no strenuous activity (running, jogging, lifting weights, gardening, sports) or until cleared by physician.   PAIN MEDICATIONS: You will be prescribed Oxycodone for pain.   If pain is not severe, consider taking Tylenol 650mg  every 6 hours Avoid aspirin for 7 days after surgery Avoid direct heat (such as heating pads) to incision sites.   May gently place ice over  surgery sites as needed.  Please place a thin clean towel over skin first and then place ice bag over towel.  Ice for 10 minutes at a time only.  POST-OPERATIVE CLINIC APPOINTMENTS: 1 week: suture removal and wound check in the office.  1 month: device activation and wound check in the office. 2.5 months: check in visit to assess usage. 3-4 months: titration sleep study based on usage of >4 hrs/night.  4 months: final wound check in the office.  Yearly: device check at office.  SCAR CARE: After incisions have healed, you will have a scar, which will continue to evolve over the course of 12 months.  Caring for your incision scars will help them to be as minimal as possible. If you are out in the sun with incision exposure, please remember to place sunscreen over the incision and surrounding skin.   You may use vitamin E or "Scar ointment/cream" to help soften scar.  Please wait one month after surgery before starting this.

## 2023-12-14 NOTE — Op Note (Addendum)
 Operative Report                                                            SURGEON: Elena Larry, MD  ASSISTANT: Powell Seats, RNFA  PREOPERATIVE DIAGNOSIS: Obstructive sleep apnea. BMI 32  POSTOPERATIVE DIAGNOSIS: Obstructive sleep apnea. BMI 32  ANESTHESIA: General endotracheal.  ESTIMATED BLOOD LOSS: Less than 15 ml.  SPECIMENS: None.  DRAINS: None.  COMPLICATIONS: None.  Procedure: 12th cranial nerve (hypoglossal) stimulation implant along with placement of right pleural respiration sensor.  Electronic analysis of the implanted neurostimulator pulse generator system  Pre-Op Diagnosis: Moderate /Severe obstructive sleep apnea with positive airway pressure intolerance (ICD-10 G47.33). Post-Op Diagnosis: Moderate /Severe obstructive sleep apnea with positive pressure airway intolerance (ICD-10 G47.33).   Anesthesia: General endotracheal  Complications: None Brief Clinical History:     This is a 68 year old patient with a history of moderate /severe obstructive sleep apnea, who is intolerant and unable to achieve benefit from positive pressure therapy. Patient has passed the clinical, polysomnographic, and endoscopic screening criteria and presents today for the implant.    Procedure Description: The patient was brought to the Operating Room and was anesthetized via general endotracheal anesthesia without complication.  A shoulder roll was placed and the patient was prepped and draped in usual sterile fashion with the head turned to the left. Prior to prepping and draping, electrodes were placed in the genioglossus and styloglossus muscle and connected to the NIM box for intraoperative nerve monitoring.  A submental incision was made in the right upper neck approximately 2 cm below the mandible in the natural skin crease. Dissection was carried down through the subcutaneous tissue and platysma. The inferior border of the submandibular gland was identified as well as  the digastric tendon. The submandibular gland and the overlying fascia with the marginal mandibular nerve were retracted superiorly.  The digastric was retracted inferiorly.  Dissection was carried down into the digastric triangle where the hypoglossal nerve was identified in its usual fashion.   The mylohyoid muscle was retracted anteriorally, and the hypoglossal nerve was dissected up towards the floor of the mouth.  The lateral branches to retrusor muscles were identified, and tested intra-operatively using the NIM stimulator.  The cuff electrode for the hypoglossal nerve stimulator was placed distally to these branches on the medial nerve branch to the genioglossus muscle. Diagnostic evaluation confirmed activation of the genioglossus nerve, resulting in genioglossal activation and tongue protrusion, confirmed both visually and on NIM monitor. The stimulation electrode was then secured to the digastric tendon on its lateral surface with the provided anchor.  A second 5 cm incision was made in the right upper chest approximately 3 cm below the clavicle.  Dissection was carried down to the pectoralis muscle. An inferior pocket was created deep to the subcutaneous layer and superficial to the pectoralis major muscle.  In the upper portion of our chest pocket, pectoralis muscle fibers were then divided until we encountered anterior chest wall. External intercostal muscle was visualized and dissected until internal intercostal was visualized. Our respiratory sense lead was then tunneled in the fascial plane between external and internal intercostals. The distal anchor was secured to the anterior chest wall using 3-2.0 silk sutures and the proximal anchor was secured to the pec major facscia.  The stimulation  lead was then tunneled in a subplatysmal plane and brought out into the sub-clavicular pocket.  Pulse generator was then brought onto the field and the sense and stimulation pins were both connected to the  appropriate headers and secured using a torque screwdriver. The implantable pulse generator was placed in the subclavicular pocket and secured loosely to the pectoralis fascia using non-resorbable sutures.    Diagnostic evaluation of the system was run, confirming good respiratory wave from the sensing lead, good anterior tongue protrusion with stimulation, and normal impedence measurements.  All the wounds were thoroughly irrigated with bacitracin irrigation.  The wounds were then closed in multiple layers with deep Vicryl sutures and a 5-0 biosyn.  Dermabond was then applied to the skin.    The patient was then awakened, extubated, and transferred to Recovery Room in stable condition. All counts correct x2.  I was present for and performed the entire procedure.  The RNFA assisted throughout the case. The RNFA was essential in retraction and counter traction when needed to make the case progress smoothly. This retraction and assistance made it possible to see the tissue planes for the procedure. The assistance was needed for blood control, tissue re-approximation and assisted with closure of the incision site

## 2023-12-14 NOTE — H&P (Signed)
 Kristin Hall is an 68 y.o. female.    Chief Complaint:  OSA CPAP intolerance  HPI: Patient presents today for planned elective procedure.  He/she denies any interval change in history since office visit on 10/19/23.  Past Medical History:  Diagnosis Date   Elevated alkaline phosphatase level    Headache    Hyperlipidemia    Hypertension    OSA on CPAP    Thrombocytosis 07/23/2015    Past Surgical History:  Procedure Laterality Date   ABDOMINAL HYSTERECTOMY     partial, left ovaries   COLONOSCOPY  10/04/2006   SLF: Normal retroflexed view of the rectum/Normal colon without evidence of polyps, masses, inflammatory changes, diverticula or arteriovenous malformations   COLONOSCOPY N/A 12/07/2016   Dr. Harvey: 15 mm polyp in cecum, s/p APC and clips. One 30 mm polyp in sigmoid, s/p clip, injection, tattooed. Path with one adenoma and one hamartomatous polyp. Surveillance 2021.    COLONOSCOPY N/A 09/08/2019   Procedure: COLONOSCOPY;  Surgeon: Harvey Margo CROME, MD;  [facility]: [facility];  Service: Endoscopy;  Laterality: N/A;  8:30AM   DRUG INDUCED ENDOSCOPY Bilateral 09/18/2023   Procedure: DRUG INDUCED SLEEP ENDOSCOPY;  Surgeon: Jude Harden GAILS, MD;  Location: [facility] ENDOSCOPY;  Service: Pulmonary;  Laterality: Bilateral;   POLYPECTOMY  12/07/2016   Procedure: POLYPECTOMY;  Surgeon: Harvey Margo CROME, MD;  [facility];  Service: Endoscopy;;  cecal;   POLYPECTOMY  09/08/2019   Procedure: POLYPECTOMY;  Surgeon: Harvey Margo CROME, MD;  [facility];  Service: Endoscopy;;    Family History  Problem Relation Age of Onset   Cancer Mother        Breast   Dementia Mother    Cancer Father        Lung Cancer, deceased at 44   Colon cancer Neg Hx    Liver disease Neg Hx     Social History:  reports that she has never smoked. She has never used smokeless tobacco. She reports that she does not drink alcohol and does not use drugs.  Allergies: No Known  Allergies  Medications Prior to Admission  Medication Sig Dispense Refill   amLODipine  (NORVASC ) 2.5 MG tablet TAKE 1 TABLET(2.5 MG) BY MOUTH DAILY 90 tablet 3   Calcium-Magnesium-Vitamin D  (CALCIUM 1200+D3 PO) Take 1 tablet by mouth in the morning.     diltiazem  (TIAZAC ) 300 MG 24 hr capsule TAKE 1 CAPSULE(300 MG) BY MOUTH DAILY 90 capsule 3   ezetimibe -simvastatin  (VYTORIN ) 10-10 MG tablet TAKE 1 TABLET BY MOUTH AT BEDTIME FOR CHOLESTEROL (Patient taking differently: Take 1 tablet by mouth every Monday, Wednesday, and Friday.) 90 tablet 3   famotidine  (PEPCID ) 40 MG tablet Take 40 mg by mouth daily as needed for heartburn or indigestion.     Multiple Vitamin (MULTIVITAMIN WITH MINERALS) TABS tablet Take 1 tablet by mouth in the morning. Centrum Silver     olmesartan -hydrochlorothiazide  (BENICAR  HCT) 40-25 MG tablet TAKE 1 TABLET BY MOUTH DAILY 90 tablet 3   pantoprazole  (PROTONIX ) 40 MG tablet TAKE 1 TABLET(40 MG) BY MOUTH DAILY 90 tablet 3   potassium chloride  SA (KLOR-CON  M) 20 MEQ tablet TAKE 1 TABLET(20 MEQ) BY MOUTH TWICE DAILY (Patient taking differently: Take 40 mEq by mouth in the morning.) 180 tablet 3    No results found for this or any previous visit (from the past 48 hours). No results found.  ROS:  ROS: Constitutional: Negative for fever, weight loss and weight gain. Cardiovascular: Negative  for chest pain and dyspnea on exertion. Respiratory: Is not experiencing shortness of breath at rest. Gastrointestinal: Negative for nausea and vomiting. Neurological: Negative for headaches. Psychiatric: The patient is not nervous/anxious   Blood pressure (!) 148/79, pulse 100, temperature 98.2 F (36.8 C), temperature source Oral, resp. rate 18, height 5' 2.5 (1.588 m), weight 80.9 kg, SpO2 94%.  PHYSICAL EXAM: Exam: General: Well-developed, well-nourished Respiratory Respiratory effort: Equal inspiration and expiration without stridor Cardiovascular Peripheral Vascular:  Warm extremities with equal color/perfusion Eyes: No nystagmus with equal extraocular motion bilaterally Neuro/Psych/Balance: Patient oriented to person, place, and time; Appropriate mood and affect; Gait is intact with no imbalance; Cranial nerves I-XII are intact Head and Face Inspection: Normocephalic and atraumatic without mass or lesion Palpation: Facial skeleton intact without bony stepoffs Salivary Glands: No mass or tenderness  Facial Strength: Facial motility symmetric and full bilaterally ENT Pinna: External ear intact and fully developed External canal: Canal is patent with intact skin Tympanic Membrane: Clear and mobile External Nose: No scar or anatomic deformity Internal Nose: Septum is relatively straight on anterior rhinoscopy. Bilateral inferior turbinate hypertrophy.  Lips, Teeth, and gums: Mucosa and teeth intact and viable Oral cavity/oropharynx: No erythema or exudate, no lesions present Neck Neck and Trachea: Midline trachea without mass or lesion Thyroid : No mass or nodularity Lymphatics: No lymphadenopathy    Assessment/Plan OSA CPAP intolerance  - risks and benefits discussed and she would like to proceed with Inspire implant    Kristin Hall 12/14/2023, 11:47 AM

## 2023-12-17 ENCOUNTER — Encounter (HOSPITAL_COMMUNITY): Payer: Self-pay | Admitting: Otolaryngology

## 2023-12-26 ENCOUNTER — Encounter (HOSPITAL_BASED_OUTPATIENT_CLINIC_OR_DEPARTMENT_OTHER): Admitting: Pulmonary Disease

## 2023-12-31 ENCOUNTER — Telehealth (INDEPENDENT_AMBULATORY_CARE_PROVIDER_SITE_OTHER): Payer: Self-pay | Admitting: Otolaryngology

## 2023-12-31 NOTE — Telephone Encounter (Signed)
 Confirmed appt & location [id number]

## 2024-01-01 ENCOUNTER — Ambulatory Visit (INDEPENDENT_AMBULATORY_CARE_PROVIDER_SITE_OTHER): Admitting: Otolaryngology

## 2024-01-01 ENCOUNTER — Encounter (INDEPENDENT_AMBULATORY_CARE_PROVIDER_SITE_OTHER): Payer: Self-pay | Admitting: Otolaryngology

## 2024-01-01 VITALS — BP 126/76 | HR 77

## 2024-01-01 DIAGNOSIS — Z789 Other specified health status: Secondary | ICD-10-CM

## 2024-01-01 DIAGNOSIS — G4733 Obstructive sleep apnea (adult) (pediatric): Secondary | ICD-10-CM

## 2024-01-01 DIAGNOSIS — Z9889 Other specified postprocedural states: Secondary | ICD-10-CM

## 2024-01-01 NOTE — Progress Notes (Signed)
 ENT Progress Note  She returns for post-op check, s/p Inspire surgery on 12/14/23. Did well after the first few days of having some pain. No tongue weakness.  PE:  Submental and chest wall incisions healed completely  CN XII intact  A/P S/p Inspire implant, doing well   - ok to proceed with activation as scheduled 01/18/24

## 2024-01-18 ENCOUNTER — Encounter: Payer: Self-pay | Admitting: Primary Care

## 2024-01-18 ENCOUNTER — Ambulatory Visit: Admitting: Primary Care

## 2024-01-18 VITALS — BP 132/74 | HR 94 | Temp 97.8°F | Ht 62.5 in | Wt 180.2 lb

## 2024-01-18 DIAGNOSIS — G4733 Obstructive sleep apnea (adult) (pediatric): Secondary | ICD-10-CM | POA: Diagnosis not present

## 2024-01-18 DIAGNOSIS — Z9889 Other specified postprocedural states: Secondary | ICD-10-CM

## 2024-01-18 MED ORDER — IBUPROFEN 600 MG PO TABS: 600.0000 mg | ORAL_TABLET | Freq: Four times a day (QID) | ORAL | 0 refills | Status: AC

## 2024-01-18 NOTE — Patient Instructions (Signed)
  VISIT SUMMARY: You came in today for the activation of your Inspire device following your recent diagnosis of moderate obstructive sleep apnea and the implantation of the device on August 1st. You reported experiencing soreness and aching pain at the surgical site, especially during physical activity. We discussed your pain management and decided to delay the activation of the Inspire device to allow more time for healing.  YOUR PLAN: -OBSTRUCTIVE SLEEP APNEA STATUS POST INSPIRE DEVICE IMPLANTATION: Obstructive sleep apnea is a condition where your airway becomes blocked during sleep, causing breathing pauses. You had an Inspire device implanted to help manage this condition. We decided to delay the activation of the device for four weeks to allow further healing. We will reschedule your activation appointment in four weeks.  -POSTOPERATIVE PAIN AND SWELLING AT INSPIRE DEVICE SITE: Postoperative pain and swelling are common after surgery. You are experiencing a dull ache at the site of your Inspire device implantation, which worsens with physical activity. We discussed alternating ibuprofen  with Tylenol  for better pain management and advised you to perform gentle neck rolls to help with tension, pressure, scar tissue, and fluid drainage. You are prescribed ibuprofen  600 mg to be taken up to four times a day as needed for pain.  INSTRUCTIONS: Please reschedule your Inspire device activation appointment for four weeks from now. Continue to manage your pain with Tylenol  and the newly prescribed ibuprofen , and perform gentle neck rolls once or twice a day.

## 2024-01-18 NOTE — Progress Notes (Signed)
 @Patient  ID: Kristin Hall, female    DOB: 08/07/1955, 68 y.o.   MRN: [id number]  Chief Complaint  Patient presents with   Obstructive Sleep Apnea    Inspire     Referring provider: Antonetta Rollene BRAVO, MD  HPI: 68 year old female, never smoked. PMH significant for HTN, OSA, GERD, hyperlipidemia, diastolic dysfunction without heart failure, obesity.   01/18/2024 Discussed the use of AI scribe software for clinical note transcription with the patient, who gave verbal consent to proceed.  History of Present Illness Kristin Hall is a 68 year old female with moderate obstructive sleep apnea who presents for [facility] activation. Kristin Hall underwent a sleep study that confirmed moderate obstructive sleep apnea with 26 apneic events per hour. Kristin Hall had previously attempted CPAP therapy, but the recent sleep study was conducted to reconfirm the diagnosis due to its ineffectiveness.  On August 1st, Kristin Hall had an Inspire device implanted. Postoperatively, Kristin Hall experiences soreness and aching pain at the surgical site, which is exacerbated by movement or activity. The pain is described as a dull ache, sometimes reaching a ten out of ten in intensity. Kristin Hall manages the pain with Tylenol , taking one in the morning and one at night, which provides temporary relief, but the pain returns with physical activity.  No pain while swallowing, eating, or talking. Kristin Hall avoids laying on her back due to her sleep apnea, which causes a sensation of being 'strangled'. Kristin Hall has not been taking ibuprofen  but is open to trying it for better pain management.  The first week post-surgery was particularly challenging, and Kristin Hall continues to feel discomfort at the incision site.  No Known Allergies  Immunization History  Administered Date(s) Administered   Fluad Quad(high Dose 65+) 02/08/2022, 01/30/2023   Influenza Split 02/09/2014   Influenza Whole 06/05/2007   Influenza,inj,Quad PF,6+ Mos 02/18/2018, 01/14/2020, 02/11/2021    Influenza-Unspecified 02/02/2017   Moderna Covid-19 Vaccine Bivalent Booster 30yrs & up 01/30/2023   Moderna SARS-COV2 Booster Vaccination 02/08/2022   Moderna Sars-Covid-2 Vaccination 06/07/2019, 07/10/2019, 03/25/2020   PNEUMOCOCCAL CONJUGATE-20 12/20/2021   Td 08/15/2006   Tdap 10/02/2016   Zoster Recombinant(Shingrix ) 02/19/2017, 10/29/2017    Past Medical History:  Diagnosis Date   Elevated alkaline phosphatase level    Headache    Hyperlipidemia    Hypertension    OSA on CPAP    Thrombocytosis 07/23/2015    Tobacco History: Social History   Tobacco Use  Smoking Status Never  Smokeless Tobacco Never   Counseling given: Not Answered   Outpatient Medications Prior to Visit  Medication Sig Dispense Refill   acetaminophen  (TYLENOL ) 500 MG tablet Take 1 tablet (500 mg total) by mouth every 6 (six) hours. Take every 6 hrs x 2-3 days then take every 6 hrs as needed 30 tablet 0   amLODipine  (NORVASC ) 2.5 MG tablet TAKE 1 TABLET(2.5 MG) BY MOUTH DAILY 90 tablet 3   amoxicillin -clavulanate (AUGMENTIN ) 875-125 MG tablet Take 1 tablet by mouth 2 (two) times daily. 20 tablet 0   Calcium-Magnesium-Vitamin D  (CALCIUM 1200+D3 PO) Take 1 tablet by mouth in the morning.     diltiazem  (TIAZAC ) 300 MG 24 hr capsule TAKE 1 CAPSULE(300 MG) BY MOUTH DAILY 90 capsule 3   ezetimibe -simvastatin  (VYTORIN ) 10-10 MG tablet TAKE 1 TABLET BY MOUTH AT BEDTIME FOR CHOLESTEROL (Patient taking differently: Take 1 tablet by mouth every Monday, Wednesday, and Friday.) 90 tablet 3   famotidine  (PEPCID ) 40 MG tablet Take 40 mg by mouth daily as needed for heartburn  or indigestion.     ibuprofen  (ADVIL ) 600 MG tablet Take 1 tablet (600 mg total) by mouth every 6 (six) hours. Please take every 6 hrs, and stagger this medication 3 hrs apart from Tylenol  30 tablet 0   Multiple Vitamin (MULTIVITAMIN WITH MINERALS) TABS tablet Take 1 tablet by mouth in the morning. Centrum Silver     olmesartan -hydrochlorothiazide   (BENICAR  HCT) 40-25 MG tablet TAKE 1 TABLET BY MOUTH DAILY 90 tablet 3   oxyCODONE  (ROXICODONE ) 5 MG immediate release tablet Take 1 tablet (5 mg total) by mouth every 8 (eight) hours as needed. 20 tablet 0   pantoprazole  (PROTONIX ) 40 MG tablet TAKE 1 TABLET(40 MG) BY MOUTH DAILY 90 tablet 3   potassium chloride  SA (KLOR-CON  M) 20 MEQ tablet TAKE 1 TABLET(20 MEQ) BY MOUTH TWICE DAILY (Patient taking differently: Take 40 mEq by mouth in the morning.) 180 tablet 3   No facility-administered medications prior to visit.   Review of Systems  Review of Systems  Constitutional: Negative.   Respiratory: Negative.     Physical Exam  BP 132/74   Pulse 94   Temp 97.8 F (36.6 C)   Ht 5' 2.5 (1.588 m)   Wt 180 lb 3.2 oz (81.7 kg)   SpO2 95% Comment: RA  BMI 32.43 kg/m  Physical Exam Constitutional:      Appearance: Normal appearance. Kristin Hall is not ill-appearing.  HENT:     Head: Normocephalic and atraumatic.  Neck:     Comments: Right neck swelling at incision site, Inc OTA without signs of infection  Cardiovascular:     Rate and Rhythm: Normal rate and regular rhythm.  Pulmonary:     Effort: Pulmonary effort is normal.     Breath sounds: Normal breath sounds.  Musculoskeletal:        General: Normal range of motion.  Skin:    General: Skin is warm and dry.  Neurological:     General: No focal deficit present.     Mental Status: Kristin Hall is alert and oriented to person, place, and time. Mental status is at baseline.  Psychiatric:        Mood and Affect: Mood normal.        Behavior: Behavior normal.        Thought Content: Thought content normal.        Judgment: Judgment normal.      Lab Results:  CBC    Component Value Date/Time   WBC 10.8 (H) 12/06/2023 0900   RBC 4.65 12/06/2023 0900   HGB 13.5 12/06/2023 0900   HGB 13.5 08/22/2023 0937   HCT 42.4 12/06/2023 0900   HCT 40.9 08/22/2023 0937   PLT 431 (H) 12/06/2023 0900   PLT 417 08/22/2023 0937   MCV 91.2 12/06/2023  0900   MCV 90 08/22/2023 0937   MCH 29.0 12/06/2023 0900   MCHC 31.8 12/06/2023 0900   RDW 12.9 12/06/2023 0900   RDW 11.8 08/22/2023 0937   LYMPHSABS 2.8 08/22/2023 0937   MONOABS 0.8 10/15/2022 0815   EOSABS 0.1 08/22/2023 0937   BASOSABS 0.1 08/22/2023 0937    BMET    Component Value Date/Time   NA 141 12/06/2023 0900   NA 144 08/22/2023 0937   K 4.1 12/06/2023 0900   CL 103 12/06/2023 0900   CO2 28 12/06/2023 0900   GLUCOSE 119 (H) 12/06/2023 0900   BUN 8 12/06/2023 0900   BUN 9 08/22/2023 0937   CREATININE 0.88 12/06/2023 0900   CREATININE  0.82 02/15/2018 1013   CALCIUM 10.5 (H) 12/06/2023 0900   GFRNONAA >60 12/06/2023 0900   GFRNONAA 77 02/15/2018 1013   GFRAA 82 06/17/2020 0933   GFRAA 90 02/15/2018 1013    BNP No results found for: BNP  ProBNP No results found for: PROBNP  Imaging: No results found.   Assessment & Plan:    1. Moderate obstructive sleep apnea (Primary)  Assessment and Plan Assessment & Plan Obstructive sleep apnea status post Inspire device implantation Moderate obstructive sleep apnea with 26 apneic events per hour confirmed by sleep study. Inspire device implanted on August 1st by Dr. Okey with [facility]. Reports postoperative soreness and discomfort, particularly during physical activities. Concerns about premature activation due to potential exacerbation of muscle stimulation and soreness. Decision made to delay activation to allow further healing. - Delay Inspire device activation for four weeks to allow further healing. - Reschedule activation appointment in four weeks.  Postoperative pain and swelling at [facility] device site Postoperative pain and swelling at the site of [place] device implantation. Pain described as a dull ache, exacerbated by physical activity, rated as 10/10 at times, alleviated by Tylenol . Incision site slightly swollen with no signs of infection. Pain is not sharp and does not affect swallowing or  eating. Discussion about alternating ibuprofen  with Tylenol  for better pain management. Advised to perform gentle massage around the incision site to alleviate tension and pressure and to help with scar tissue and fluid drainage. - Prescribe ibuprofen  600 mg to be taken up to four times a day as needed for pain. - Instruct to alternate ibuprofen  with Tylenol  for pain management. - Advised on gentle massage around the incision site once or twice a day to alleviate tension and pressure and to help with scar tissue and fluid drainage.    Almarie LELON Ferrari, NP 01/18/2024

## 2024-01-21 ENCOUNTER — Encounter: Payer: Self-pay | Admitting: Pulmonary Disease

## 2024-02-06 ENCOUNTER — Ambulatory Visit (HOSPITAL_COMMUNITY)

## 2024-02-12 ENCOUNTER — Ambulatory Visit: Payer: Medicare Other

## 2024-02-12 VITALS — Ht 62.5 in | Wt 178.0 lb

## 2024-02-12 DIAGNOSIS — Z0000 Encounter for general adult medical examination without abnormal findings: Secondary | ICD-10-CM | POA: Diagnosis not present

## 2024-02-12 DIAGNOSIS — Z780 Asymptomatic menopausal state: Secondary | ICD-10-CM

## 2024-02-12 NOTE — Progress Notes (Signed)
 Please attest and cosign this visit due to patients primary care provider not being immediately available at the time the visit was completed.   Subjective:   Kristin Hall is a 68 y.o. who presents for a Medicare Wellness preventive visit. As a reminder, Annual Wellness Visits don't include a physical exam, and some assessments may be limited, especially if this visit is performed virtually. We may recommend an in-person follow-up visit with your provider if needed.  Visit Complete: Virtual I connected with  Wyline KATHEE Gavel on 02/12/24 by a audio enabled telemedicine application and verified that I am speaking with the correct person using two identifiers.  Patient Location: Home  Provider Location: Home Office  I discussed the limitations of evaluation and management by telemedicine. The patient expressed understanding and agreed to proceed.  Vital Signs: Because this visit was a virtual/telehealth visit, some criteria may be missing or patient reported. Any vitals not documented were not able to be obtained and vitals that have been documented are patient reported. VideoDeclined- This patient declined Librarian, academic. Therefore the visit was completed with audio only.  Persons Participating in Visit: Patient.  AWV Questionnaire: Yes: Patient Medicare AWV questionnaire was completed by the patient on 02/05/2024; I have confirmed that all information answered by patient is correct and no changes since this date. Cardiac Risk Factors include: advanced age (>15men, >34 women);dyslipidemia;hypertension;obesity (BMI >30kg/m2)     Objective:    Today's Vitals   02/12/24 1557  Weight: 178 lb (80.7 kg)  Height: 5' 2.5 (1.588 m)   Body mass index is 32.04 kg/m.    02/12/2024    3:56 PM 12/06/2023    8:32 AM 09/18/2023    7:11 AM 02/07/2023   10:08 AM 10/15/2022    8:05 AM 09/08/2019    7:36 AM 12/07/2016    7:26 AM  Advanced Directives  Does Patient  Have a Medical Advance Directive? No No No No No No No   Would patient like information on creating a medical advance directive? No - Patient declined Yes (MAU/Ambulatory/Procedural Areas - Information given) No - Patient declined Yes (MAU/Ambulatory/Procedural Areas - Information given) No - Patient declined No - Patient declined No - Patient declined      Data saved with a previous flowsheet row definition    Current Medications (verified) Outpatient Encounter Medications as of 02/12/2024  Medication Sig   acetaminophen  (TYLENOL ) 500 MG tablet Take 1 tablet (500 mg total) by mouth every 6 (six) hours. Take every 6 hrs x 2-3 days then take every 6 hrs as needed   amLODipine  (NORVASC ) 2.5 MG tablet TAKE 1 TABLET(2.5 MG) BY MOUTH DAILY   Calcium-Magnesium-Vitamin D  (CALCIUM 1200+D3 PO) Take 1 tablet by mouth in the morning.   diltiazem  (TIAZAC ) 300 MG 24 hr capsule TAKE 1 CAPSULE(300 MG) BY MOUTH DAILY   ezetimibe -simvastatin  (VYTORIN ) 10-10 MG tablet TAKE 1 TABLET BY MOUTH AT BEDTIME FOR CHOLESTEROL (Patient taking differently: Take 1 tablet by mouth every Monday, Wednesday, and Friday.)   Multiple Vitamin (MULTIVITAMIN WITH MINERALS) TABS tablet Take 1 tablet by mouth in the morning. Centrum Silver   olmesartan -hydrochlorothiazide  (BENICAR  HCT) 40-25 MG tablet TAKE 1 TABLET BY MOUTH DAILY   pantoprazole  (PROTONIX ) 40 MG tablet TAKE 1 TABLET(40 MG) BY MOUTH DAILY   potassium chloride  SA (KLOR-CON  M) 20 MEQ tablet TAKE 1 TABLET(20 MEQ) BY MOUTH TWICE DAILY (Patient taking differently: Take 40 mEq by mouth in the morning.)   ibuprofen  (ADVIL ) 600  MG tablet Take 1 tablet (600 mg total) by mouth every 6 (six) hours. Please take every 6 hrs, and stagger this medication 3 hrs apart from Tylenol    [DISCONTINUED] amoxicillin -clavulanate (AUGMENTIN ) 875-125 MG tablet Take 1 tablet by mouth 2 (two) times daily.   [DISCONTINUED] famotidine  (PEPCID ) 40 MG tablet Take 40 mg by mouth daily as needed for  heartburn or indigestion.   [DISCONTINUED] oxyCODONE  (ROXICODONE ) 5 MG immediate release tablet Take 1 tablet (5 mg total) by mouth every 8 (eight) hours as needed.   No facility-administered encounter medications on file as of 02/12/2024.    Allergies (verified) Patient has no known allergies.   History: Past Medical History:  Diagnosis Date   Elevated alkaline phosphatase level    GERD (gastroesophageal reflux disease)    Glaucoma    Headache    Hyperlipidemia    Hypertension    OSA on CPAP    Sleep apnea    Thrombocytosis 07/23/2015   Past Surgical History:  Procedure Laterality Date   ABDOMINAL HYSTERECTOMY     partial, left ovaries   COLONOSCOPY  10/04/2006   SLF: Normal retroflexed view of the rectum/Normal colon without evidence of polyps, masses, inflammatory changes, diverticula or arteriovenous malformations   COLONOSCOPY N/A 12/07/2016   Dr. Harvey: 15 mm polyp in cecum, s/p APC and clips. One 30 mm polyp in sigmoid, s/p clip, injection, tattooed. Path with one adenoma and one hamartomatous polyp. Surveillance 2021.    COLONOSCOPY N/A 09/08/2019   Procedure: COLONOSCOPY;  Surgeon: Harvey Margo CROME, MD;  Location: [facility]ITE;  Service: Endoscopy;  Laterality: N/A;  8:30AM   DRUG INDUCED ENDOSCOPY Bilateral 09/18/2023   Procedure: DRUG INDUCED SLEEP ENDOSCOPY;  Surgeon: Jude Harden GAILS, MD;  Location: [facility] ENDOSCOPY;  Service: Pulmonary;  Laterality: Bilateral;   IMPLANTATION OF HYPOGLOSSAL NERVE STIMULATOR N/A 12/14/2023   Procedure: INSERTION, HYPOGLOSSAL NERVE STIMULATOR;  Surgeon: Okey Burns, MD;  Location: [facility] OR;  Service: ENT;  Laterality: N/A;   POLYPECTOMY  12/07/2016   Procedure: POLYPECTOMY;  Surgeon: Harvey Margo CROME, MD;  Location: [facility];  Service: Endoscopy;;  cecal;   POLYPECTOMY  09/08/2019   Procedure: POLYPECTOMY;  Surgeon: Harvey Margo CROME, MD;  Location: [facility];  Service: Endoscopy;;   Family History  Problem Relation Age of Onset    Cancer Mother        Breast   Dementia Mother    Cancer Father        Lung Cancer, deceased at 29   Cancer Brother    Colon cancer Neg Hx    Liver disease Neg Hx    Social History   Socioeconomic History   Marital status: Widowed    Spouse name: Not on file   Number of children: 1   Years of education: Not on file   Highest education level: Associate degree: academic program  Occupational History   Not on file  Tobacco Use   Smoking status: Never   Smokeless tobacco: Never  Vaping Use   Vaping status: Never Used  Substance and Sexual Activity   Alcohol use: No   Drug use: No   Sexual activity: Not Currently  Other Topics Concern   Not on file  Social History Narrative   Not on file   Social Drivers of Health   Financial Resource Strain: Low Risk  (02/12/2024)   Overall Financial Resource Strain (CARDIA)    Difficulty of Paying Living Expenses: Not hard at all  Food Insecurity:  No Food Insecurity (02/12/2024)   Hunger Vital Sign    Worried About Running Out of Food in the Last Year: Never true    Ran Out of Food in the Last Year: Never true  Transportation Needs: No Transportation Needs (02/12/2024)   PRAPARE - Administrator, Civil Service (Medical): No    Lack of Transportation (Non-Medical): No  Physical Activity: Insufficiently Active (02/12/2024)   Exercise Vital Sign    Days of Exercise per Week: 3 days    Minutes of Exercise per Session: 20 min  Stress: No Stress Concern Present (02/12/2024)   [employer] of Occupational Health - Occupational Stress Questionnaire    Feeling of Stress: Not at all  Social Connections: Moderately Integrated (02/12/2024)   Social Connection and Isolation Panel    Frequency of Communication with Friends and Family: More than three times a week    Frequency of Social Gatherings with Friends and Family: More than three times a week    Attends Religious Services: More than 4 times per year    Active Member of  [employer] or Organizations: Yes    Attends Banker Meetings: More than 4 times per year    Marital Status: Widowed    Tobacco Counseling Counseling given: Yes   Clinical Intake: Pre-visit preparation completed: Yes Pain : No/denies pain   BMI - recorded: 32.04 Nutritional Status: BMI > 30  Obese Nutritional Risks: None Diabetes: No Lab Results  Component Value Date   HGBA1C 6.1 (H) 08/22/2023   HGBA1C 5.9 (H) 02/21/2023   HGBA1C 5.6 04/04/2013    How often do you need to have someone help you when you read instructions, pamphlets, or other written materials from your doctor or pharmacy?: 1 - Never Interpreter Needed?: No Information entered by :: Derek Laughter W CMA (AAMA)  Activities of Daily Living     02/05/2024    9:34 AM 12/06/2023    8:35 AM  In your present state of health, do you have any difficulty performing the following activities:  Hearing? 0   Vision? 0   Difficulty concentrating or making decisions? 0   Walking or climbing stairs? 0   Dressing or bathing? 0   Doing errands, shopping? 0 0  Preparing Food and eating ? N   Using the Toilet? N   In the past six months, have you accidently leaked urine? N   Do you have problems with loss of bowel control? N   Managing your Medications? N   Managing your Finances? N   Housekeeping or managing your Housekeeping? N    Patient Care Team: Antonetta Rollene BRAVO, MD as PCP - General Malachy, Comer GAILS, NP as Nurse Practitioner (Nurse Practitioner) Darroll Anes, DO (Optometry) Hope Almarie ORN, NP as Nurse Practitioner (Pulmonary Disease) Okey Burns, MD as Consulting Physician (Otolaryngology) Watt Rush, MD as Attending Physician (Urology) Cindie Carlin POUR, DO as Consulting Physician (Gastroenterology)   I have updated your Care Teams any recent Medical Services you may have received from other providers in the past year.     Assessment:   This is a routine wellness examination for  Ashanti.  Hearing/Vision screen Hearing Screening - Comments:: Patient denies any hearing difficulties.   Vision Screening - Comments:: Wears rx glasses - up to date with routine eye exams with  Anes Darroll.   Goals Addressed               This Visit's Progress     stay  healthy (pt-stated)        I want to remain healthy. I'm joining a cheer exercise class in my church        Depression Screen     02/12/2024    4:02 PM 08/22/2023    8:58 AM 04/18/2023    8:35 AM 02/21/2023    9:43 AM 02/07/2023   10:07 AM 12/04/2022    1:33 PM 08/18/2022    9:06 AM  PHQ 2/9 Scores  PHQ - 2 Score 0 0 0 0 0 0 0  PHQ- 9 Score 0           Fall Risk     02/05/2024    9:34 AM 08/22/2023    8:58 AM 04/18/2023    8:35 AM 02/21/2023    9:43 AM 02/07/2023   10:05 AM  Fall Risk   Falls in the past year? 0 0 0 0 0  Number falls in past yr: 0 0 0 0 0  Injury with Fall? 0 0 0 0 0  Risk for fall due to : No Fall Risks  No Fall Risks No Fall Risks No Fall Risks  Follow up Falls evaluation completed;Education provided;Falls prevention discussed Falls evaluation completed Falls evaluation completed Falls evaluation completed Falls prevention discussed    MEDICARE RISK AT HOME:  Medicare Risk at Home Any stairs in or around the home?: (Patient-Rptd) No If so, are there any without handrails?: No Home free of loose throw rugs in walkways, pet beds, electrical cords, etc?: (Patient-Rptd) No Adequate lighting in your home to reduce risk of falls?: (Patient-Rptd) Yes Life alert?: (Patient-Rptd) No Use of a cane, walker or w/c?: (Patient-Rptd) No Grab bars in the bathroom?: (Patient-Rptd) Yes Shower chair or bench in shower?: (Patient-Rptd) No Elevated toilet seat or a handicapped toilet?: (Patient-Rptd) No  TIMED UP AND GO: Was the test performed?  No  Cognitive Function: 6CIT completed        02/12/2024    4:01 PM 02/07/2023   10:09 AM  6CIT Screen  What Year? 0 points 0 points  What month? 0  points 0 points  What time? 0 points 0 points  Count back from 20 0 points 0 points  Months in reverse 0 points 0 points  Repeat phrase 0 points 0 points  Total Score 0 points 0 points    Immunizations Immunization History  Administered Date(s) Administered   Fluad Quad(high Dose 65+) 02/08/2022, 01/30/2023   Influenza Split 02/09/2014   Influenza Whole 06/05/2007   Influenza,inj,Quad PF,6+ Mos 02/18/2018, 01/14/2020, 02/11/2021   Influenza-Unspecified 02/02/2017   Moderna Covid-19 Vaccine Bivalent Booster 31yrs & up 01/30/2023   Moderna SARS-COV2 Booster Vaccination 02/08/2022   Moderna Sars-Covid-2 Vaccination 06/07/2019, 07/10/2019, 03/25/2020   PNEUMOCOCCAL CONJUGATE-20 12/20/2021   Td 08/15/2006   Tdap 10/02/2016   Zoster Recombinant(Shingrix ) 02/19/2017, 10/29/2017    Screening Tests Health Maintenance  Topic Date Due   Colonoscopy  09/08/2023   DEXA SCAN  02/02/2024   Mammogram  02/05/2024   COVID-19 Vaccine (6 - 2025-26 season) 03/19/2024 (Originally 01/14/2024)   Influenza Vaccine  08/12/2024 (Originally 12/14/2023)   Medicare Annual Wellness (AWV)  02/11/2025   DTaP/Tdap/Td (3 - Td or Tdap) 10/03/2026   Pneumococcal Vaccine: 50+ Years  Completed   Hepatitis C Screening  Completed   Zoster Vaccines- Shingrix   Completed   HPV VACCINES  Aged Out   Meningococcal B Vaccine  Aged Out    Health Maintenance Health Maintenance Due  Topic Date Due  Colonoscopy  09/08/2023   DEXA SCAN  02/02/2024   Mammogram  02/05/2024   Health Maintenance Items Addressed: DEXA ordered  Additional Screening: Vision Screening: Recommended annual ophthalmology exams for early detection of glaucoma and other disorders of the eye. Would you like a referral to an eye doctor? No    Dental Screening: Recommended annual dental exams for proper oral hygiene  Community Resource Referral / Chronic Care Management: CRR required this visit?  No   CCM required this visit?  No  Plan:    I have personally reviewed and noted the following in the patient's chart:   Medical and social history Use of alcohol, tobacco or illicit drugs  Current medications and supplements including opioid prescriptions. Patient is not currently taking opioid prescriptions. Functional ability and status Nutritional status Physical activity Advanced directives List of other physicians Hospitalizations, surgeries, and ER visits in previous 12 months Vitals Screenings to include cognitive, depression, and falls Referrals and appointments  In addition, I have reviewed and discussed with patient certain preventive protocols, quality metrics, and best practice recommendations. A written personalized care plan for preventive services as well as general preventive health recommendations were provided to patient.   Halford Goetzke, CMA   02/12/2024   After Visit Summary: (MyChart) Due to this being a telephonic visit, the after visit summary with patients personalized plan was offered to patient via MyChart   Notes: Nothing significant to report at this time.

## 2024-02-12 NOTE — Patient Instructions (Signed)
 Kristin Hall,  Thank you for taking the time for your Medicare Wellness Visit. I appreciate your continued commitment to your health goals. Please review the care plan we discussed, and feel free to reach out if I can assist you further.  Medicare recommends these wellness visits once per year to help you and your care team stay ahead of potential health issues. These visits are designed to focus on prevention, allowing your provider to concentrate on managing your acute and chronic conditions during your regular appointments.  Please note that Annual Wellness Visits do not include a physical exam. Some assessments may be limited, especially if the visit was conducted virtually. If needed, we may recommend a separate in-person follow-up with your provider.  Ongoing Care  Seeing your primary care provider every 3 to 6 months helps us  monitor your health and provide consistent, personalized care.  Referrals  Osteoporosis Screening: Please call the number below to schedule your appt. [facility] Phone: [phone] If you haven't heard from the office in 2 weeks, please call them to schedule your appointment  Recommended Screenings:  Health Maintenance  Topic Date Due   Colon Cancer Screening  09/08/2023   DEXA scan (bone density measurement)  02/02/2024   Breast Cancer Screening  02/05/2024   COVID-19 Vaccine (6 - 2025-26 season) 03/19/2024*   Flu Shot  08/12/2024*   Medicare Annual Wellness Visit  02/11/2025   DTaP/Tdap/Td vaccine (3 - Td or Tdap) 10/03/2026   Pneumococcal Vaccine for age over 57  Completed   Hepatitis C Screening  Completed   Zoster (Shingles) Vaccine  Completed   HPV Vaccine  Aged Out   Meningitis B Vaccine  Aged Out  *Topic was postponed. The date shown is not the original due date.       02/12/2024    3:56 PM  Advanced Directives  Does Patient Have a Medical Advance Directive? No  Would patient like information on creating a medical  advance directive? No - Patient declined    Advance Care Planning is important because it: Ensures you receive medical care that aligns with your values, goals, and preferences. Provides guidance to your family and loved ones, reducing the emotional burden of decision-making during critical moments.  Vision: Annual vision screenings are recommended for early detection of glaucoma, cataracts, and diabetic retinopathy. These exams can also reveal signs of chronic conditions such as diabetes and high blood pressure.  Dental: Annual dental screenings help detect early signs of oral cancer, gum disease, and other conditions linked to overall health, including heart disease and diabetes.  Please see the attached documents for additional preventive care recommendations.

## 2024-02-21 ENCOUNTER — Other Ambulatory Visit: Payer: Self-pay | Admitting: Family Medicine

## 2024-02-21 DIAGNOSIS — K219 Gastro-esophageal reflux disease without esophagitis: Secondary | ICD-10-CM

## 2024-02-22 ENCOUNTER — Encounter: Payer: Self-pay | Admitting: Nurse Practitioner

## 2024-02-22 ENCOUNTER — Ambulatory Visit: Admitting: Nurse Practitioner

## 2024-02-22 VITALS — BP 134/80 | HR 86 | Ht 62.5 in | Wt 181.4 lb

## 2024-02-22 DIAGNOSIS — G4733 Obstructive sleep apnea (adult) (pediatric): Secondary | ICD-10-CM

## 2024-02-22 NOTE — Patient Instructions (Addendum)
 Start using your Inspire device nightly. You will step up your levels once a week by 1 level. Your step up day will be Fridays moving forward. If you develop any discomfort/soreness/pain, step back down by 1 level, stay there for a week and then increase by 1 level again and see if it's better. Always double check your levels on the back of your remote to make sure you're on the level you should be   Review your remote teaching and your app   We discussed how untreated sleep apnea puts an individual at risk for cardiac arrhthymias, pulm HTN, DM, stroke and increases their risk for daytime accidents  Use caution when driving and pull over if you become sleepy   Follow up 11/21 at 9 am with Katie Diahann Guajardo,NP, or sooner, if needed

## 2024-02-22 NOTE — Assessment & Plan Note (Addendum)
 Hypoglossal nerve stimulator was assessed today.  Inspire was activated with comfortable tongue protrusion. She denies any discomfort.  Device was set to conventional configuration+/-/+ Programming :  1.  Stimulation level : Sensation 0.2 V , functional level 0.2 V lower limit 0.2 V, upper limit 1.2 V.  She was set at level 1  2.  Start delay 30 minutes, pause time 15 minutes, duration 7 hours 3.  Sensing waveform was analyzed for 3 minutes 4.  Sleep remote education was provided patient demonstrated competency with the remote and was aware of patient Instruction videos and sleep remote guide 5.  Patient was instructed to step up levels by 1 level (0.1 V ) every week.  6.  Check-in visit in presleep study in 6 weeks to ensure that they are stepping up levels, using therapy  all night, every night and to evaluate subjective benefit.   7.  Inspire titration sleep study will be scheduled 2 to 4 weeks after the prestudy visit to verify efficacy of device  Patient Instructions  Start using your Inspire device nightly. You will step up your levels once a week by 1 level. Your step up day will be Fridays moving forward. If you develop any discomfort/soreness/pain, step back down by 1 level, stay there for a week and then increase by 1 level again and see if it's better. Always double check your levels on the back of your remote to make sure you're on the level you should be   Review your remote teaching and your app   We discussed how untreated sleep apnea puts an individual at risk for cardiac arrhthymias, pulm HTN, DM, stroke and increases their risk for daytime accidents  Use caution when driving and pull over if you become sleepy   Follow up 11/21 at 9 am with Katie Aava Deland,NP, or sooner, if needed

## 2024-02-22 NOTE — Progress Notes (Signed)
 @Patient  ID: Kristin Hall, female    DOB: 22-Dec-1955, 68 y.o.   MRN: [id number]  Chief Complaint  Patient presents with   Sleep Apnea    Inspire activation    Referring provider: Antonetta Rollene BRAVO, MD  HPI: 68 year old female, never smoker followed for OSA treated with Inspire.  Past medical history significant for hypertension, OSA on CPAP, GERD, diastolic dysfunction, HLD, obesity.  TEST/EVENTS:  06/20/2017 NPSG: AHI 27.3/h, SpO2 low 76%.  Moderate OSA; weight 183 lb 08/16/2023 HST: AHI 26.9/h, SpO2 low 82%  08/02/2023: OV with Nicholson Starace NP Kristin Hall is a 68 year old female with sleep apnea who presents for sleep consult  She has not used her CPAP machine for at least a year. She finds the CPAP machine uncomfortable, particularly due to the headgear and difficulty sleeping on her back. The machine is approximately four years old.  She acknowledges snoring at night as reported by others but denies waking up gasping for air or experiencing morning headaches. No drowsy driving or falling asleep while driving, but she does experience daytime sleepiness, typically taking a nap between 2 and 4 PM daily. Her sleep apnea was classified as moderate during her initial sleep study, with an average of 27 events per hour. She has maintained a stable weight over the past five years, with no significant changes.  She would like to discuss alternative options for management of her sleep apnea.  She does have a cousin who got the inspire device and she is interested in this. She goes to bed between 9 and 11 PM.  Typically takes her about an hour to fall asleep.  Wakes 2-3 times a night.  Gets up around 4 AM.  She is retired.  Weight has been stable.  Last sleep study was in 2019.  Does not use any supplemental oxygen . She is a never smoker.  Does not drink alcohol.  No excessive caffeine intake.  Lives by herself.  Family history of cancer. [place] 6    01/18/2024: OV with Hope NP. Inspire implanted  8/1. Activation delayed due to ongoing healing.   02/22/2024: Today - Inspire Activation  Patient presents today for inspire activation. She is no longer having any neck soreness or discomfort to her incision sites. Speech and swallowing at normal. She denies any concerns regarding healing. She feels unchanged compared to her baseline regarding her sleep. No drowsy driving or sleep parasomnias.   No Known Allergies  Immunization History  Administered Date(s) Administered   Fluad Quad(high Dose 65+) 02/08/2022, 01/30/2023   Influenza Split 02/09/2014   Influenza Whole 06/05/2007   Influenza,inj,Quad PF,6+ Mos 02/18/2018, 01/14/2020, 02/11/2021   Influenza-Unspecified 02/02/2017   Moderna Covid-19 Vaccine Bivalent Booster 71yrs & up 01/30/2023   Moderna SARS-COV2 Booster Vaccination 02/08/2022   Moderna Sars-Covid-2 Vaccination 06/07/2019, 07/10/2019, 03/25/2020   PNEUMOCOCCAL CONJUGATE-20 12/20/2021   Td 08/15/2006   Tdap 10/02/2016   Zoster Recombinant(Shingrix ) 02/19/2017, 10/29/2017    Past Medical History:  Diagnosis Date   Elevated alkaline phosphatase level    GERD (gastroesophageal reflux disease)    Glaucoma    Headache    Hyperlipidemia    Hypertension    OSA on CPAP    Sleep apnea    Thrombocytosis 07/23/2015    Tobacco History: Social History   Tobacco Use  Smoking Status Never  Smokeless Tobacco Never   Counseling given: Not Answered   Outpatient Medications Prior to Visit  Medication Sig Dispense Refill   acetaminophen  (TYLENOL ) 500  MG tablet Take 1 tablet (500 mg total) by mouth every 6 (six) hours. Take every 6 hrs x 2-3 days then take every 6 hrs as needed 30 tablet 0   amLODipine  (NORVASC ) 2.5 MG tablet TAKE 1 TABLET(2.5 MG) BY MOUTH DAILY 90 tablet 3   Calcium-Magnesium-Vitamin D  (CALCIUM 1200+D3 PO) Take 1 tablet by mouth in the morning.     diltiazem  (TIAZAC ) 300 MG 24 hr capsule TAKE 1 CAPSULE(300 MG) BY MOUTH DAILY 90 capsule 3    ezetimibe -simvastatin  (VYTORIN ) 10-10 MG tablet TAKE 1 TABLET BY MOUTH AT BEDTIME FOR CHOLESTEROL (Patient taking differently: Take 1 tablet by mouth every Monday, Wednesday, and Friday.) 90 tablet 3   Multiple Vitamin (MULTIVITAMIN WITH MINERALS) TABS tablet Take 1 tablet by mouth in the morning. Centrum Silver     olmesartan -hydrochlorothiazide  (BENICAR  HCT) 40-25 MG tablet TAKE 1 TABLET BY MOUTH DAILY 90 tablet 3   pantoprazole  (PROTONIX ) 40 MG tablet TAKE 1 TABLET(40 MG) BY MOUTH DAILY 90 tablet 3   potassium chloride  SA (KLOR-CON  M) 20 MEQ tablet TAKE 1 TABLET(20 MEQ) BY MOUTH TWICE DAILY (Patient taking differently: Take 40 mEq by mouth in the morning.) 180 tablet 3   ibuprofen  (ADVIL ) 600 MG tablet Take 1 tablet (600 mg total) by mouth every 6 (six) hours. Please take every 6 hrs, and stagger this medication 3 hrs apart from Tylenol  30 tablet 0   No facility-administered medications prior to visit.     Review of Systems:   Constitutional: No weight loss or gain, night sweats, fevers, chills, or lassitude. +fatigue  HEENT: No headaches, difficulty swallowing, tooth/dental problems, or sore throat. No sneezing, itching, ear ache, nasal congestion, or post nasal drip CV:  No chest pain, orthopnea, PND, swelling in lower extremities, anasarca, dizziness, palpitations, syncope Resp: +snoring.  GI:  No heartburn, indigestion GU: +nocturia. No dysuria, daytime frequency, hematuria  Skin: No rash, lesions, ulcerations MSK:  No joint pain or swelling.   Neuro: No dizziness or lightheadedness.  Psych: No depression or anxiety. Mood stable. +sleep disturbance     Physical Exam:  BP 134/80 (BP Location: Right Arm, Patient Position: Sitting, Cuff Size: Normal)   Pulse 86   Ht 5' 2.5 (1.588 m)   Wt 181 lb 6.4 oz (82.3 kg)   SpO2 95% Comment: RA  BMI 32.65 kg/m   GEN: Pleasant, interactive, well-appearing; obese; in no acute distress HEENT:  Normocephalic and atraumatic. PERRLA. Sclera  white. Nasal turbinates pink, moist and patent bilaterally. No rhinorrhea present. Oropharynx pink and moist, without exudate or edema. No lesions, ulcerations, or postnasal drip. Mallampati II NECK:  Supple w/ fair ROM. Thyroid  symmetrical with no goiter or nodules palpated. No lymphadenopathy.   CV: RRR, no m/r/g PULMONARY:  Unlabored, regular breathing. Clear bilaterally A&P w/o wheezes/rales/rhonchi. No accessory muscle use.  GI: BS present and normoactive. Soft, non-tender to palpation. MSK: No erythema, warmth or tenderness. Cap refil <2 sec all extrem. Neuro: A/Ox3. No focal deficits noted.   Skin: Warm, no lesions or rashe Psych: Normal affect and behavior. Judgement and thought content appropriate.     Lab Results:  CBC    Component Value Date/Time   WBC 10.8 (H) 12/06/2023 0900   RBC 4.65 12/06/2023 0900   HGB 13.5 12/06/2023 0900   HGB 13.5 08/22/2023 0937   HCT 42.4 12/06/2023 0900   HCT 40.9 08/22/2023 0937   PLT 431 (H) 12/06/2023 0900   PLT 417 08/22/2023 0937   MCV 91.2 12/06/2023 0900  MCV 90 08/22/2023 0937   MCH 29.0 12/06/2023 0900   MCHC 31.8 12/06/2023 0900   RDW 12.9 12/06/2023 0900   RDW 11.8 08/22/2023 0937   LYMPHSABS 2.8 08/22/2023 0937   MONOABS 0.8 10/15/2022 0815   EOSABS 0.1 08/22/2023 0937   BASOSABS 0.1 08/22/2023 0937    BMET    Component Value Date/Time   NA 141 12/06/2023 0900   NA 144 08/22/2023 0937   K 4.1 12/06/2023 0900   CL 103 12/06/2023 0900   CO2 28 12/06/2023 0900   GLUCOSE 119 (H) 12/06/2023 0900   BUN 8 12/06/2023 0900   BUN 9 08/22/2023 0937   CREATININE 0.88 12/06/2023 0900   CREATININE 0.82 02/15/2018 1013   CALCIUM 10.5 (H) 12/06/2023 0900   GFRNONAA >60 12/06/2023 0900   GFRNONAA 77 02/15/2018 1013   GFRAA 82 06/17/2020 0933   GFRAA 90 02/15/2018 1013    BNP No results found for: BNP   Imaging:  No results found.   Administration History     None           No data to display           No results found for: NITRICOXIDE      Assessment & Plan:   OSA (obstructive sleep apnea) Hypoglossal nerve stimulator was assessed today.  Inspire was activated with comfortable tongue protrusion. She denies any discomfort.  Device was set to conventional configuration+/-/+ Programming :  1.  Stimulation level : Sensation 0.2 V , functional level 0.2 V lower limit 0.2 V, upper limit 1.2 V.  She was set at level 1  2.  Start delay 30 minutes, pause time 15 minutes, duration 7 hours 3.  Sensing waveform was analyzed for 3 minutes 4.  Sleep remote education was provided patient demonstrated competency with the remote and was aware of patient Instruction videos and sleep remote guide 5.  Patient was instructed to step up levels by 1 level (0.1 V ) every week.  6.  Check-in visit in presleep study in 6 weeks to ensure that they are stepping up levels, using therapy  all night, every night and to evaluate subjective benefit.   7.  Inspire titration sleep study will be scheduled 2 to 4 weeks after the prestudy visit to verify efficacy of device  Patient Instructions  Start using your Inspire device nightly. You will step up your levels once a week by 1 level. Your step up day will be Fridays moving forward. If you develop any discomfort/soreness/pain, step back down by 1 level, stay there for a week and then increase by 1 level again and see if it's better. Always double check your levels on the back of your remote to make sure you're on the level you should be   Review your remote teaching and your app   We discussed how untreated sleep apnea puts an individual at risk for cardiac arrhthymias, pulm HTN, DM, stroke and increases their risk for daytime accidents  Use caution when driving and pull over if you become sleepy   Follow up 11/21 at 9 am with Katie Eulas Schweitzer,NP, or sooner, if needed    Advised if symptoms do not improve or worsen, to please contact office for sooner follow up  or seek emergency care.   I spent 35 minutes of dedicated to the care of this patient on the date of this encounter to include pre-visit review of records, face-to-face time with the patient discussing conditions above, post visit  ordering of testing, clinical documentation with the electronic health record, making appropriate referrals as documented, and communicating necessary findings to members of the patients care team.  Comer LULLA Rouleau, NP 02/22/2024  Pt aware and understands NP's role.

## 2024-02-27 ENCOUNTER — Encounter: Admitting: Family Medicine

## 2024-03-24 ENCOUNTER — Other Ambulatory Visit: Payer: Self-pay | Admitting: Family Medicine

## 2024-03-24 ENCOUNTER — Ambulatory Visit (HOSPITAL_COMMUNITY)
Admission: RE | Admit: 2024-03-24 | Discharge: 2024-03-24 | Disposition: A | Discharge status: Home / Self Care | Source: Ambulatory Visit | Attending: Family Medicine | Admitting: Family Medicine

## 2024-03-24 DIAGNOSIS — Z1231 Encounter for screening mammogram for malignant neoplasm of breast: Secondary | ICD-10-CM | POA: Diagnosis present

## 2024-03-28 ENCOUNTER — Ambulatory Visit: Admitting: Primary Care

## 2024-04-02 ENCOUNTER — Ambulatory Visit: Admitting: Gastroenterology

## 2024-04-02 ENCOUNTER — Encounter: Payer: Self-pay | Admitting: Gastroenterology

## 2024-04-02 VITALS — BP 136/74 | HR 94 | Temp 97.3°F | Ht 62.5 in | Wt 181.1 lb

## 2024-04-02 DIAGNOSIS — K760 Fatty (change of) liver, not elsewhere classified: Secondary | ICD-10-CM | POA: Diagnosis not present

## 2024-04-02 DIAGNOSIS — R748 Abnormal levels of other serum enzymes: Secondary | ICD-10-CM

## 2024-04-02 DIAGNOSIS — K219 Gastro-esophageal reflux disease without esophagitis: Secondary | ICD-10-CM | POA: Diagnosis not present

## 2024-04-02 NOTE — Patient Instructions (Signed)
 I have ordered routine labs.  We will see you back in 6 months!  We will call to triage for the colonoscopy next year!  So good to see you!  It was a pleasure to see you today. I want to create trusting relationships with patients and provide genuine, compassionate, and quality care. I truly value your feedback, so please be on the lookout for a survey regarding your visit with me today. I appreciate your time in completing this!         Kristin MICAEL Stager, PhD, ANP-BC [facility]

## 2024-04-02 NOTE — Progress Notes (Signed)
 Gastroenterology Office Note     Primary Care Physician:  Kristin Rollene BRAVO, MD  Primary Gastroenterologist: Dr Kristin Hall   Chief Complaint   Chief Complaint  Patient presents with   Follow-up    Pt arrives for follow up. States no questions/concerns at this time     History of Present Illness   Kristin Hall is a 68 y.o. female presenting today with a history of GERD, chronically elevated alkaline phosphatase with benign workup previously (normal immunoglobulin, AMA normal x2, GGT normal on several occasions),  adenomatous colon polyps due for surveillance in 2026, hepatic steatosis, returning for routine follow-up.    Protonix  once daily, no dysphagia, no abdominal pain. No overt GI bleeding, lack of appetite, unintentional weight loss. No jaundice or pruritus.   Hollie fibrosure F0  Elevated alk phos evaluation:  - Most recent labs April 2025 with alk phos 157, other LFTs and bilirubin wnl.  -US  elastography Feb 2025: hepatic steatosis, portal vein patent, elevated kPa at 11.9 but low data quality with IQR median ration 0.86.  - MR Abd MRCP w w/o 06/03/2012: Normal liver and biliary system.  - RUQ US  07/16/20: Fatty liver.  - Dr. Cindie previously recommended updating US  every 2 years  - Has osteopenia per DEXA in September 2023. .  - No symptoms of decompensated liver disease.   April 2021 last colonoscopy.   Two 3 to 6 mm polyps in the mid transverse colon                            and in the ascending colon, removed with a cold                            snare. Resected and retrieved.                           - Diverticulosis in the recto-sigmoid colon and in                            the sigmoid colon.                           - External and internal hemorrhoids.                           - Tortuous colon.   Past Medical History:  Diagnosis Date   Elevated alkaline phosphatase level    GERD (gastroesophageal reflux disease)    Glaucoma    Headache     Hyperlipidemia    Hypertension    OSA on CPAP    Sleep apnea    Thrombocytosis 07/23/2015    Past Surgical History:  Procedure Laterality Date   ABDOMINAL HYSTERECTOMY     partial, left ovaries   COLONOSCOPY  10/04/2006   SLF: Normal retroflexed view of the rectum/Normal colon without evidence of polyps, masses, inflammatory changes, diverticula or arteriovenous malformations   COLONOSCOPY N/A 12/07/2016   Dr. Harvey: 15 mm polyp in cecum, s/p APC and clips. One 30 mm polyp in sigmoid, s/p clip, injection, tattooed. Path with one adenoma and one hamartomatous polyp. Surveillance 2021.    COLONOSCOPY N/A 09/08/2019   Procedure: COLONOSCOPY;  Surgeon: Harvey Margo CROME, MD;  Location: [facility]  ENDO SUITE;  Service: Endoscopy;  Laterality: N/A;  8:30AM   DRUG INDUCED ENDOSCOPY Bilateral 09/18/2023   Procedure: DRUG INDUCED SLEEP ENDOSCOPY;  Surgeon: Jude Harden GAILS, MD;  Location: [facility] ENDOSCOPY;  Service: Pulmonary;  Laterality: Bilateral;   IMPLANTATION OF HYPOGLOSSAL NERVE STIMULATOR N/A 12/14/2023   Procedure: INSERTION, HYPOGLOSSAL NERVE STIMULATOR;  Surgeon: Okey Burns, MD;  Location: [facility] OR;  Service: ENT;  Laterality: N/A;   POLYPECTOMY  12/07/2016   Procedure: POLYPECTOMY;  Surgeon: Harvey Margo CROME, MD;  Location: [facility] ENDO [facility];  Service: Endoscopy;;  cecal;   POLYPECTOMY  09/08/2019   Procedure: POLYPECTOMY;  Surgeon: Harvey Margo CROME, MD;  Location: [facility] ENDO [facility];  Service: Endoscopy;;    Current Outpatient Medications  Medication Sig Dispense Refill   acetaminophen  (TYLENOL ) 500 MG tablet Take 1 tablet (500 mg total) by mouth every 6 (six) hours. Take every 6 hrs x 2-3 days then take every 6 hrs as needed 30 tablet 0   amLODipine  (NORVASC ) 2.5 MG tablet TAKE 1 TABLET(2.5 MG) BY MOUTH DAILY 90 tablet 3   Calcium-Magnesium-Vitamin D  (CALCIUM 1200+D3 PO) Take 1 tablet by mouth in the morning.     diltiazem  (TIAZAC ) 300 MG 24 hr capsule TAKE 1 CAPSULE(300 MG) BY MOUTH DAILY 90 capsule 3    ezetimibe -simvastatin  (VYTORIN ) 10-10 MG tablet TAKE 1 TABLET BY MOUTH AT BEDTIME FOR CHOLESTEROL (Patient taking differently: Take 1 tablet by mouth every Monday, Wednesday, and Friday.) 90 tablet 3   Multiple Vitamin (MULTIVITAMIN WITH MINERALS) TABS tablet Take 1 tablet by mouth in the morning. Centrum Silver     olmesartan -hydrochlorothiazide  (BENICAR  HCT) 40-25 MG tablet TAKE 1 TABLET BY MOUTH DAILY 90 tablet 3   pantoprazole  (PROTONIX ) 40 MG tablet TAKE 1 TABLET(40 MG) BY MOUTH DAILY 90 tablet 3   potassium chloride  SA (KLOR-CON  M) 20 MEQ tablet TAKE 1 TABLET(20 MEQ) BY MOUTH TWICE DAILY 180 tablet 3   ibuprofen  (ADVIL ) 600 MG tablet Take 1 tablet (600 mg total) by mouth every 6 (six) hours. Please take every 6 hrs, and stagger this medication 3 hrs apart from Tylenol  30 tablet 0   No current facility-administered medications for this visit.    Allergies as of 04/02/2024   (No Known Allergies)    Family History  Problem Relation Age of Onset   Cancer Mother        Breast   Dementia Mother    Cancer Father        Lung Cancer, deceased at 76   Cancer Brother    Colon cancer Neg Hx    Liver disease Neg Hx     Social History   Socioeconomic History   Marital status: Widowed    Spouse name: Not on file   Number of children: 1   Years of education: Not on file   Highest education level: Associate degree: academic program  Occupational History   Not on file  Tobacco Use   Smoking status: Never   Smokeless tobacco: Never  Vaping Use   Vaping status: Never Used  Substance and Sexual Activity   Alcohol use: No   Drug use: No   Sexual activity: Not Currently  Other Topics Concern   Not on file  Social History Narrative   Not on file   Social Drivers of Health   Financial Resource Strain: Low Risk  (02/12/2024)   Overall Financial Resource Strain (CARDIA)    Difficulty of Paying Living Expenses: Not hard at all  Food Insecurity: No  Food Insecurity (02/12/2024)    Hunger Vital Sign    Worried About Running Out of Food in the Last Year: Never true    Ran Out of Food in the Last Year: Never true  Transportation Needs: No Transportation Needs (02/12/2024)   PRAPARE - Administrator, Civil Service (Medical): No    Lack of Transportation (Non-Medical): No  Physical Activity: Insufficiently Active (02/12/2024)   Exercise Vital Sign    Days of Exercise per Week: 3 days    Minutes of Exercise per Session: 20 min  Stress: No Stress Concern Present (02/12/2024)   [employer] of Occupational Health - Occupational Stress Questionnaire    Feeling of Stress: Not at all  Social Connections: Moderately Integrated (02/12/2024)   Social Connection and Isolation Panel    Frequency of Communication with Friends and Family: More than three times a week    Frequency of Social Gatherings with Friends and Family: More than three times a week    Attends Religious Services: More than 4 times per year    Active Member of [employer] or Organizations: Yes    Attends Banker Meetings: More than 4 times per year    Marital Status: Widowed  Intimate Partner Violence: Not At Risk (02/12/2024)   Humiliation, Afraid, Rape, and Kick questionnaire    Fear of Current or Ex-Partner: No    Emotionally Abused: No    Physically Abused: No    Sexually Abused: No     Review of Systems   Gen: Denies any fever, chills, fatigue, weight loss, lack of appetite.  CV: Denies chest pain, heart palpitations, peripheral edema, syncope.  Resp: Denies shortness of breath at rest or with exertion. Denies wheezing or cough.  GI: Denies dysphagia or odynophagia. Denies jaundice, hematemesis, fecal incontinence. GU : Denies urinary burning, urinary frequency, urinary hesitancy MS: Denies joint pain, muscle weakness, cramps, or limitation of movement.  Derm: Denies rash, itching, dry skin Psych: Denies depression, anxiety, memory loss, and confusion Heme: Denies bruising,  bleeding, and enlarged lymph nodes.   Physical Exam   BP 136/74   Pulse 94   Temp (!) 97.3 F (36.3 C)   Ht 5' 2.5 (1.588 m)   Wt 181 lb 1.6 oz (82.1 kg)   BMI 32.60 kg/m  General:   Alert and oriented. Pleasant and cooperative. Well-nourished and well-developed.  Head:  Normocephalic and atraumatic. Eyes:  Without icterus Abdomen:  +BS, soft, non-tender and non-distended. No HSM noted. No guarding or rebound. No masses appreciated.  Rectal:  Deferred  Msk:  Symmetrical without gross deformities. Normal posture. Extremities:  Without edema. Neurologic:  Alert and  oriented x4;  grossly normal neurologically. Skin:  Intact without significant lesions or rashes. Psych:  Alert and cooperative. Normal mood and affect.   Assessment   Kristin Hall is a 68 y.o. female presenting today with a history of GERD, chronically elevated alkaline phosphatase with benign workup previously (normal immunoglobulin, AMA normal x2, GGT normal on several occasions),  adenomatous colon polyps due for surveillance in 2026, hepatic steatosis, returning for routine follow-up.   Hepatic steatosis: will check ELF. Elastography results not reliable in light of increased kPa ratio. Update CMP as well.   Elevated alk phos: longstanding. Fluctuating at times but remaining mildly elevated with evaluation as noted above. No concerning signs/symptoms. If she were to have any significant jumps or bumps in transaminases, I would recommend a liver biopsy. Doubt we are dealing with AMA negative  PBC but unable to completely rule out.   GERD controlled on PPI once daily.  Colonoscopy due next year with history of adenomas and can triage.    PLAN    ELF, CMP TCS in 2026, will triage 6 month follow-up Consider liver biopsy if bumps in alk phos to rule out AMA negative PBC   Therisa MICAEL Stager, PhD, [facility] Gastroenterology

## 2024-04-04 ENCOUNTER — Ambulatory Visit (INDEPENDENT_AMBULATORY_CARE_PROVIDER_SITE_OTHER): Admitting: Nurse Practitioner

## 2024-04-04 ENCOUNTER — Ambulatory Visit: Admitting: Nurse Practitioner

## 2024-04-04 ENCOUNTER — Encounter: Payer: Self-pay | Admitting: Nurse Practitioner

## 2024-04-04 VITALS — BP 138/80 | HR 88 | Ht 62.5 in | Wt 183.0 lb

## 2024-04-04 DIAGNOSIS — G4733 Obstructive sleep apnea (adult) (pediatric): Secondary | ICD-10-CM | POA: Diagnosis not present

## 2024-04-04 DIAGNOSIS — F5101 Primary insomnia: Secondary | ICD-10-CM | POA: Diagnosis not present

## 2024-04-04 DIAGNOSIS — Z4542 Encounter for adjustment and management of neuropacemaker (brain) (peripheral nerve) (spinal cord): Secondary | ICD-10-CM | POA: Diagnosis not present

## 2024-04-04 DIAGNOSIS — Z9682 Presence of neurostimulator: Secondary | ICD-10-CM | POA: Diagnosis not present

## 2024-04-04 MED ORDER — RAMELTEON 8 MG PO TABS: 8.0000 mg | ORAL_TABLET | Freq: Every day | ORAL | 1 refills | Status: AC

## 2024-04-04 NOTE — Patient Instructions (Addendum)
 Continue using your Inspire device nightly. I stepped you up to your level 6. If you're still unable to tolerate this, go back down to level 5 and stay there. Always double check your levels on the back of your remote to make sure you're on the level you should be    We discussed how untreated sleep apnea puts an individual at risk for cardiac arrhthymias, pulm HTN, DM, stroke and increases their risk for daytime accidents   Use caution when driving and pull over if you become sleepy   Someone should contact you to schedule your in lab study   Start ramelteon  At bedtime as needed for sleep. Take immediately before bed. Ensure you have 7-8 hours in the bed after taking. Monitor for any mood changes or changes in sleep habits. Stop and notify immediately if these occur. Do not drive or operate heavy machinery after taking. Do not take with alcohol or other sedating medications. Ensure you apply you turn your Inspire on within 5-10 minutes of taking to avoid falling asleep without it. May cause some morning grogginess or vivid dreams.     Follow up 1/7 at 330 PM in [place] with Katie Avrey Flanagin,NP, or sooner, if needed

## 2024-04-04 NOTE — Assessment & Plan Note (Addendum)
 Sleep onset difficulties. Typically able to fall back asleep after night time awakenings. Adjusted start delay and pause on Inspire. Will trial her on ramelteon  At bedtime PRN bedtime. Side effect/safety profile reviewed. Sleep hygiene reviewed.

## 2024-04-04 NOTE — Progress Notes (Signed)
 @Patient  ID: Kristin Hall, female    DOB: 1955/10/18, 68 y.o.   MRN: [id number]  Chief Complaint  Patient presents with   INSPIRE    Some nights having a hard time falling asleep or she wakes up a few hours later and has a hard time falling back asleep. She is feeling well rested during the day.     Referring provider: Antonetta Rollene BRAVO, MD  HPI: 68 year old female, never smoker followed for OSA treated with Inspire.  Past medical history significant for hypertension, OSA on CPAP, GERD, diastolic dysfunction, HLD, obesity.  TEST/EVENTS:  06/20/2017 NPSG: AHI 27.3/h, SpO2 low 76%.  Moderate OSA; weight 183 lb 08/16/2023 HST: AHI 26.9/h, SpO2 low 82%  08/02/2023: OV with Menno Vanbergen NP Kristin Hall is a 68 year old female with sleep apnea who presents for sleep consult  She has not used her CPAP machine for at least a year. She finds the CPAP machine uncomfortable, particularly due to the headgear and difficulty sleeping on her back. The machine is approximately four years old.  She acknowledges snoring at night as reported by others but denies waking up gasping for air or experiencing morning headaches. No drowsy driving or falling asleep while driving, but she does experience daytime sleepiness, typically taking a nap between 2 and 4 PM daily. Her sleep apnea was classified as moderate during her initial sleep study, with an average of 27 events per hour. She has maintained a stable weight over the past five years, with no significant changes.  She would like to discuss alternative options for management of her sleep apnea.  She does have a cousin who got the inspire device and she is interested in this. She goes to bed between 9 and 11 PM.  Typically takes her about an hour to fall asleep.  Wakes 2-3 times a night.  Gets up around 4 AM.  She is retired.  Weight has been stable.  Last sleep study was in 2019.  Does not use any supplemental oxygen . She is a never smoker.  Does not drink  alcohol.  No excessive caffeine intake.  Lives by herself.  Family history of cancer. [place] 6    01/18/2024: OV with Hope NP. Inspire implanted 8/1. Activation delayed due to ongoing healing.   02/22/2024:  SHERLEAN with Dempsy Damiano NP Patient presents today for inspire activation. She is no longer having any neck soreness or discomfort to her incision sites. Speech and swallowing at normal. She denies any concerns regarding healing. She feels unchanged compared to her baseline regarding her sleep. No drowsy driving or sleep parasomnias.   04/04/2024: Today - follow up Patient presents today for Inspire follow up. She has been stepping up her levels every week and doing very well with the Inspire. She did go up to a level 6 and felt it was painful so she stepped back down to level 5 and has stayed here. She's sleeping with it every night. She occasionally wakes up at night, due to other reasons, and doesn't feel like her pause is quite long enough. She does feel like her sleep is more restful. Her energy is better during the day. Still having difficulties with falling asleep at night. Can take her 1-2 hours some nights. Doesn't take any sleep aids. No drowsy driving. No discomfort on current setting.        No Known Allergies  Immunization History  Administered Date(s) Administered   Fluad Quad(high Dose 65+) 02/08/2022, 01/30/2023   Influenza  Split 02/09/2014   Influenza Whole 06/05/2007   Influenza,inj,Quad PF,6+ Mos 02/18/2018, 01/14/2020, 02/11/2021   Influenza-Unspecified 02/02/2017, 02/13/2024   Moderna Covid-19 Vaccine Bivalent Booster 17yrs & up 01/30/2023   Moderna SARS-COV2 Booster Vaccination 02/08/2022   Moderna Sars-Covid-2 Vaccination 06/07/2019, 07/10/2019, 03/25/2020   PNEUMOCOCCAL CONJUGATE-20 12/20/2021   Td 08/15/2006   Tdap 10/02/2016   Zoster Recombinant(Shingrix ) 02/19/2017, 10/29/2017    Past Medical History:  Diagnosis Date   Elevated alkaline phosphatase level     GERD (gastroesophageal reflux disease)    Glaucoma    Headache    Hyperlipidemia    Hypertension    OSA on CPAP    Sleep apnea    Thrombocytosis 07/23/2015    Tobacco History: Social History   Tobacco Use  Smoking Status Never  Smokeless Tobacco Never   Counseling given: Not Answered   Outpatient Medications Prior to Visit  Medication Sig Dispense Refill   acetaminophen  (TYLENOL ) 500 MG tablet Take 1 tablet (500 mg total) by mouth every 6 (six) hours. Take every 6 hrs x 2-3 days then take every 6 hrs as needed 30 tablet 0   amLODipine  (NORVASC ) 2.5 MG tablet TAKE 1 TABLET(2.5 MG) BY MOUTH DAILY 90 tablet 3   Calcium-Magnesium-Vitamin D  (CALCIUM 1200+D3 PO) Take 1 tablet by mouth in the morning.     diltiazem  (TIAZAC ) 300 MG 24 hr capsule TAKE 1 CAPSULE(300 MG) BY MOUTH DAILY 90 capsule 3   ezetimibe -simvastatin  (VYTORIN ) 10-10 MG tablet TAKE 1 TABLET BY MOUTH AT BEDTIME FOR CHOLESTEROL 90 tablet 3   ibuprofen  (ADVIL ) 600 MG tablet Take 1 tablet (600 mg total) by mouth every 6 (six) hours. Please take every 6 hrs, and stagger this medication 3 hrs apart from Tylenol  30 tablet 0   Multiple Vitamin (MULTIVITAMIN WITH MINERALS) TABS tablet Take 1 tablet by mouth in the morning. Centrum Silver     olmesartan -hydrochlorothiazide  (BENICAR  HCT) 40-25 MG tablet TAKE 1 TABLET BY MOUTH DAILY 90 tablet 3   pantoprazole  (PROTONIX ) 40 MG tablet TAKE 1 TABLET(40 MG) BY MOUTH DAILY 90 tablet 3   potassium chloride  SA (KLOR-CON  M) 20 MEQ tablet TAKE 1 TABLET(20 MEQ) BY MOUTH TWICE DAILY 180 tablet 3   No facility-administered medications prior to visit.     Review of Systems: as above    Physical Exam:  BP 138/80   Pulse 88   Ht 5' 2.5 (1.588 m) Comment: per pt  Wt 183 lb (83 kg)   SpO2 97% Comment: on RA  BMI 32.94 kg/m   GEN: Pleasant, interactive, well-appearing; obese; in no acute distress HEENT:  Normocephalic and atraumatic. PERRLA. Sclera white. Nasal turbinates pink, moist  and patent bilaterally. No rhinorrhea present. Oropharynx pink and moist, without exudate or edema. No lesions, ulcerations, or postnasal drip. Mallampati II NECK:  Supple w/ fair ROM. Thyroid  symmetrical with no goiter or nodules palpated. No lymphadenopathy.   CV: RRR, no m/r/g PULMONARY:  Unlabored, regular breathing. Clear bilaterally A&P w/o wheezes/rales/rhonchi. No accessory muscle use.  GI: BS present and normoactive. Soft, non-tender to palpation. MSK: No erythema, warmth or tenderness. Cap refil <2 sec all extrem. Neuro: A/Ox3. No focal deficits noted.   Skin: Warm, no lesions or rashe Psych: Normal affect and behavior. Judgement and thought content appropriate.     Lab Results:  CBC    Component Value Date/Time   WBC 10.8 (H) 12/06/2023 0900   RBC 4.65 12/06/2023 0900   HGB 13.5 12/06/2023 0900   HGB 13.5 08/22/2023 0937  HCT 42.4 12/06/2023 0900   HCT 40.9 08/22/2023 0937   PLT 431 (H) 12/06/2023 0900   PLT 417 08/22/2023 0937   MCV 91.2 12/06/2023 0900   MCV 90 08/22/2023 0937   MCH 29.0 12/06/2023 0900   MCHC 31.8 12/06/2023 0900   RDW 12.9 12/06/2023 0900   RDW 11.8 08/22/2023 0937   LYMPHSABS 2.8 08/22/2023 0937   MONOABS 0.8 10/15/2022 0815   EOSABS 0.1 08/22/2023 0937   BASOSABS 0.1 08/22/2023 0937    BMET    Component Value Date/Time   NA 141 12/06/2023 0900   NA 144 08/22/2023 0937   K 4.1 12/06/2023 0900   CL 103 12/06/2023 0900   CO2 28 12/06/2023 0900   GLUCOSE 119 (H) 12/06/2023 0900   BUN 8 12/06/2023 0900   BUN 9 08/22/2023 0937   CREATININE 0.88 12/06/2023 0900   CREATININE 0.82 02/15/2018 1013   CALCIUM 10.5 (H) 12/06/2023 0900   GFRNONAA >60 12/06/2023 0900   GFRNONAA 77 02/15/2018 1013   GFRAA 82 06/17/2020 0933   GFRAA 90 02/15/2018 1013    BNP No results found for: BNP   Imaging:  MM 3D SCREENING MAMMOGRAM BILATERAL BREAST Result Date: 03/25/2024 CLINICAL DATA:  Screening. EXAM: DIGITAL SCREENING BILATERAL MAMMOGRAM  WITH TOMOSYNTHESIS AND CAD TECHNIQUE: Bilateral screening digital craniocaudal and mediolateral oblique mammograms were obtained. Bilateral screening digital breast tomosynthesis was performed. The images were evaluated with computer-aided detection. COMPARISON:  Previous exam(s). ACR Breast Density Category b: There are scattered areas of fibroglandular density. FINDINGS: There are no findings suspicious for malignancy. IMPRESSION: No mammographic evidence of malignancy. A result letter of this screening mammogram will be mailed directly to the patient. RECOMMENDATION: Screening mammogram in one year. (Code:SM-B-01Y) BI-RADS CATEGORY  1: Negative. Electronically Signed   By: Curtistine Noble   On: 03/25/2024 14:15     Administration History     None           No data to display          No results found for: NITRICOXIDE      Assessment & Plan:   OSA (obstructive sleep apnea) Severe OSA, intolerant of CPAP and now on Inspire.  12/14/2023 Inspire implantation with Dr. Soldatova Activation delayed due to ongoing healing.  02/22/2024 Inspire activated 0.2-1.2 V   Hypoglossal nerve stimulator was assessed today.  Comfortable but strong protrusion on level 6, 0.7V. Increased her to this and advised her that if it is uncomfortable/painful, to step back down by 1 level and stay at her level 5 (0.6V). Will schedule her Inspire titration study. She denies any discomfort.  Device was set to conventional configuration+/-/+ Programming :  1.  Stimulation level : Sensation 0.2 V , functional level 0.2 V lower limit 0.2 V, upper limit 1.2 V.  She was set at level 6 2.  Start delay adjusted to 45 minutes, pause time adjust to 30 minutes, duration 7 hours 3.  Sensing waveform was analyzed for 3 minutes 4.  Sleep remote education was provided patient demonstrated competency with the remote and was aware of patient Instruction videos and sleep remote guide Will check in in 6 weeks prior to  [facility] Titration    Patient Instructions  Continue using your Inspire device nightly. I stepped you up to your level 6. If you're still unable to tolerate this, go back down to level 5 and stay there. Always double check your levels on the back of your remote to make sure you're on  the level you should be    We discussed how untreated sleep apnea puts an individual at risk for cardiac arrhthymias, pulm HTN, DM, stroke and increases their risk for daytime accidents   Use caution when driving and pull over if you become sleepy   Someone should contact you to schedule your in lab study   Start ramelteon  At bedtime as needed for sleep. Take immediately before bed. Ensure you have 7-8 hours in the bed after taking. Monitor for any mood changes or changes in sleep habits. Stop and notify immediately if these occur. Do not drive or operate heavy machinery after taking. Do not take with alcohol or other sedating medications. Ensure you apply you turn your Inspire on within 5-10 minutes of taking to avoid falling asleep without it. May cause some morning grogginess or vivid dreams.     Follow up 1/7 at 330 PM in [place] with Katie Lannis Lichtenwalner,NP, or sooner, if needed     Insomnia Sleep onset difficulties. Typically able to fall back asleep after night time awakenings. Adjusted start delay and pause on Inspire. Will trial her on ramelteon  At bedtime PRN bedtime. Side effect/safety profile reviewed. Sleep hygiene reviewed.    Advised if symptoms do not improve or worsen, to please contact office for sooner follow up or seek emergency care.   I spent 35 minutes of dedicated to the care of this patient on the date of this encounter to include pre-visit review of records, face-to-face time with the patient discussing conditions above, post visit ordering of testing, clinical documentation with the electronic health record, making appropriate referrals as documented, and communicating necessary findings to  members of the patients care team.  Comer LULLA Rouleau, NP 04/04/2024  Pt aware and understands NP's role.

## 2024-04-04 NOTE — Assessment & Plan Note (Addendum)
 Severe OSA, intolerant of CPAP and now on Inspire.  12/14/2023 Inspire implantation with Dr. Soldatova Activation delayed due to ongoing healing.  02/22/2024 Inspire activated 0.2-1.2 V   Hypoglossal nerve stimulator was assessed today.  Comfortable but strong protrusion on level 6, 0.7V. Increased her to this and advised her that if it is uncomfortable/painful, to step back down by 1 level and stay at her level 5 (0.6V). Will schedule her Inspire titration study. She denies any discomfort.  Device was set to conventional configuration+/-/+ Programming :  1.  Stimulation level : Sensation 0.2 V , functional level 0.2 V lower limit 0.2 V, upper limit 1.2 V.  She was set at level 6 2.  Start delay adjusted to 45 minutes, pause time adjust to 30 minutes, duration 7 hours 3.  Sensing waveform was analyzed for 3 minutes 4.  Sleep remote education was provided patient demonstrated competency with the remote and was aware of patient Instruction videos and sleep remote guide Will check in in 6 weeks prior to [facility] Titration    Patient Instructions  Continue using your Inspire device nightly. I stepped you up to your level 6. If you're still unable to tolerate this, go back down to level 5 and stay there. Always double check your levels on the back of your remote to make sure you're on the level you should be    We discussed how untreated sleep apnea puts an individual at risk for cardiac arrhthymias, pulm HTN, DM, stroke and increases their risk for daytime accidents   Use caution when driving and pull over if you become sleepy   Someone should contact you to schedule your in lab study   Start ramelteon  At bedtime as needed for sleep. Take immediately before bed. Ensure you have 7-8 hours in the bed after taking. Monitor for any mood changes or changes in sleep habits. Stop and notify immediately if these occur. Do not drive or operate heavy machinery after taking. Do not take with alcohol or other  sedating medications. Ensure you apply you turn your Inspire on within 5-10 minutes of taking to avoid falling asleep without it. May cause some morning grogginess or vivid dreams.     Follow up 1/7 at 330 PM in [place] with Katie Diedre Maclellan,NP, or sooner, if needed

## 2024-04-17 LAB — COMPREHENSIVE METABOLIC PANEL WITH GFR
ALT: 12 IU/L (ref 0–32)
AST: 22 IU/L (ref 0–40)
Albumin: 4.4 g/dL (ref 3.9–4.9)
Alkaline Phosphatase: 170 IU/L — ABNORMAL HIGH (ref 49–135)
BUN/Creatinine Ratio: 13 (ref 12–28)
BUN: 10 mg/dL (ref 8–27)
Bilirubin Total: 0.6 mg/dL (ref 0.0–1.2)
CO2: 25 mmol/L (ref 20–29)
Calcium: 10 mg/dL (ref 8.7–10.3)
Chloride: 101 mmol/L (ref 96–106)
Creatinine, Ser: 0.79 mg/dL (ref 0.57–1.00)
Globulin, Total: 3 g/dL (ref 1.5–4.5)
Glucose: 104 mg/dL — ABNORMAL HIGH (ref 70–99)
Potassium: 3.8 mmol/L (ref 3.5–5.2)
Sodium: 142 mmol/L (ref 134–144)
Total Protein: 7.4 g/dL (ref 6.0–8.5)
eGFR: 82 mL/min/1.73 (ref >59)

## 2024-04-17 LAB — ENHANCED LIVER FIBROSIS (ELF): ELF(TM) Score: 10.07 — ABNORMAL HIGH (ref <9.80)

## 2024-04-19 ENCOUNTER — Other Ambulatory Visit: Payer: Self-pay | Admitting: Family Medicine

## 2024-05-07 ENCOUNTER — Ambulatory Visit: Payer: Self-pay | Admitting: Gastroenterology

## 2024-05-21 ENCOUNTER — Ambulatory Visit: Admitting: Nurse Practitioner

## 2024-06-02 ENCOUNTER — Ambulatory Visit: Admitting: Sleep Medicine

## 2024-06-02 VITALS — BP 100/70 | HR 91 | Temp 98.5°F | Ht 62.0 in | Wt 180.6 lb

## 2024-06-02 DIAGNOSIS — Z9682 Presence of neurostimulator: Secondary | ICD-10-CM | POA: Diagnosis not present

## 2024-06-02 DIAGNOSIS — F5104 Psychophysiologic insomnia: Secondary | ICD-10-CM

## 2024-06-02 DIAGNOSIS — G4700 Insomnia, unspecified: Secondary | ICD-10-CM

## 2024-06-02 DIAGNOSIS — G4733 Obstructive sleep apnea (adult) (pediatric): Secondary | ICD-10-CM

## 2024-06-02 NOTE — Progress Notes (Signed)
 "      Name:Kristin Hall MRN: [id number] DOB: November 19, 1955   CHIEF COMPLAINT:  INSPIRE READINESS CHECK   HISTORY OF PRESENT ILLNESS: Kristin Hall is a 69 y.o. w/ a h/o OSA, GERD, HTN and obesity who presents for Inspire readiness check. Patient is currently on level 6 (0.7 V). Reports using Inspire therapy every night, which is confirmed by compliance data. States that she has not increased her setting since last visit due to feeling rested upon awakening with Inspire therapy. States that she is unsure if she still snores.    PAST MEDICAL HISTORY :   has a past medical history of Elevated alkaline phosphatase level, GERD (gastroesophageal reflux disease), Glaucoma, Headache, Hyperlipidemia, Hypertension, OSA on CPAP, Sleep apnea, and Thrombocytosis (07/23/2015).  has a past surgical history that includes Abdominal hysterectomy; Colonoscopy (10/04/2006); Colonoscopy (N/A, 12/07/2016); polypectomy (12/07/2016); Colonoscopy (N/A, 09/08/2019); polypectomy (09/08/2019); Drug induced endoscopy (Bilateral, 09/18/2023); and Implantation of hypoglossal nerve stimulator (N/A, 12/14/2023). Prior to Admission medications  Medication Sig Start Date End Date Taking? Authorizing Provider  acetaminophen  (TYLENOL ) 500 MG tablet Take 1 tablet (500 mg total) by mouth every 6 (six) hours. Take every 6 hrs x 2-3 days then take every 6 hrs as needed 12/14/23   Soldatova, [place], MD  amLODipine  (NORVASC ) 2.5 MG tablet TAKE 1 TABLET(2.5 MG) BY MOUTH DAILY 04/21/24   Antonetta Rollene BRAVO, MD  Calcium-Magnesium-Vitamin D  (CALCIUM 1200+D3 PO) Take 1 tablet by mouth in the morning.    [provider]  diltiazem  (TIAZAC ) 300 MG 24 hr capsule TAKE 1 CAPSULE BY MOUTH EVERY DAY 04/21/24   Antonetta Rollene BRAVO, MD  ezetimibe -simvastatin  (VYTORIN ) 10-10 MG tablet TAKE 1 TABLET BY MOUTH AT BEDTIME FOR CHOLESTEROL 01/24/23   Antonetta Rollene BRAVO, MD  ibuprofen  (ADVIL ) 600 MG tablet Take 1 tablet (600 mg total) by mouth every 6  (six) hours. Please take every 6 hrs, and stagger this medication 3 hrs apart from Tylenol  01/18/24   Hope Almarie ORN, NP  Multiple Vitamin (MULTIVITAMIN WITH MINERALS) TABS tablet Take 1 tablet by mouth in the morning. Centrum Silver    [provider]  olmesartan -hydrochlorothiazide  (BENICAR  HCT) 40-25 MG tablet TAKE 1 TABLET BY MOUTH DAILY 02/21/24   Antonetta Rollene BRAVO, MD  pantoprazole  (PROTONIX ) 40 MG tablet TAKE 1 TABLET(40 MG) BY MOUTH DAILY 02/21/24   Antonetta Rollene BRAVO, MD  potassium chloride  SA (KLOR-CON  M) 20 MEQ tablet TAKE 1 TABLET(20 MEQ) BY MOUTH TWICE DAILY 03/24/24   Antonetta Rollene BRAVO, MD  ramelteon  (ROZEREM ) 8 MG tablet Take 1 tablet (8 mg total) by mouth at bedtime. 04/04/24   Malachy Comer GAILS, NP   Allergies[1]  FAMILY HISTORY:  family history includes Cancer in her brother, father, and mother; Dementia in her mother. SOCIAL HISTORY:  reports that she has never smoked. She has never used smokeless tobacco. She reports that she does not drink alcohol and does not use drugs.   Review of Systems:  Gen:  Denies  fever, sweats, chills weight loss  HEENT: Denies blurred vision, double vision, ear pain, eye pain, hearing loss, nose bleeds, sore throat Cardiac:  No dizziness, chest pain or heaviness, chest tightness,edema, No JVD Resp:   No cough, -sputum production, -shortness of breath,-wheezing, -hemoptysis,  Gi: Denies swallowing difficulty, stomach pain, nausea or vomiting, diarrhea, constipation, bowel incontinence Gu:  Denies bladder incontinence, burning urine Ext:   Denies Joint pain, stiffness or swelling Skin: Denies  skin rash, easy bruising or bleeding or hives Endoc:  Denies polyuria, polydipsia , polyphagia or weight change Psych:   Denies depression, insomnia or hallucinations  Other:  All other systems negative  VITAL SIGNS: BP 100/70   Pulse 91   Temp 98.5 F (36.9 C)   Ht 5' 2 (1.575 m)   Wt 180 lb 9.6 oz (81.9 kg)   SpO2 95%   BMI  33.03 kg/m    Physical Examination:   General Appearance: No distress  EYES PERRLA, EOM intact.   NECK Supple, No JVD Pulmonary: normal breath sounds, No wheezing.  CardiovascularNormal S1,S2.  No m/r/g.   Abdomen: Benign, Soft, non-tender. Skin:   warm, no rashes, no ecchymosis  Extremities: normal, no cyanosis, clubbing. Neuro:without focal findings,  speech normal  PSYCHIATRIC: Mood, affect within normal limits.   ASSESSMENT AND PLAN  OSA Tongue motion checked in office. Changed amplitude setting to 0.7 V. Advised patient to increase by one level every Monday and will complete Inspire titration study in a few weeks. Discussed the consequences of untreated sleep apnea. Advised not to drive drowsy for safety of patient and others. Will follow up to review Inspire titration study results.    Insomnia Counseled patient on stimulus control and improving sleep hygiene practices.    MEDICATION ADJUSTMENTS/LABS AND TESTS ORDERED: Recommend Sleep Study   Patient  satisfied with Plan of action and management. All questions answered  Follow up to review Inspire titration study results and treatment plan.   I spent a total of 42 minutes reviewing chart data, face-to-face evaluation with the patient, counseling and coordination of care as detailed above.    Geeta Dworkin, M.D.  Sleep Medicine [facility]           [1] No Known Allergies  "

## 2024-06-02 NOTE — Patient Instructions (Signed)
 Use inspire therapy all night every night. Increase by one level every week. Will complete inspire titration study and follow up to review results.

## 2024-06-16 ENCOUNTER — Telehealth: Admitting: Emergency Medicine

## 2024-06-16 DIAGNOSIS — J069 Acute upper respiratory infection, unspecified: Secondary | ICD-10-CM

## 2024-06-16 MED ORDER — AZELASTINE HCL 0.1 % NA SOLN: 2.0000 | Freq: Two times a day (BID) | NASAL | 0 refills | Status: AC

## 2024-06-16 MED ORDER — BENZONATATE 100 MG PO CAPS: 100.0000 mg | ORAL_CAPSULE | Freq: Two times a day (BID) | ORAL | 0 refills | Status: AC | PRN

## 2024-06-17 ENCOUNTER — Encounter: Payer: Self-pay | Admitting: Family Medicine

## 2024-06-18 ENCOUNTER — Ambulatory Visit (HOSPITAL_BASED_OUTPATIENT_CLINIC_OR_DEPARTMENT_OTHER): Admitting: Pulmonary Disease

## 2024-06-19 ENCOUNTER — Encounter

## 2024-06-23 ENCOUNTER — Encounter: Admitting: Sleep Medicine

## 2024-08-14 ENCOUNTER — Encounter: Admitting: Family Medicine

## 2024-08-25 ENCOUNTER — Ambulatory Visit (HOSPITAL_BASED_OUTPATIENT_CLINIC_OR_DEPARTMENT_OTHER): Admitting: Pulmonary Disease

## 2024-09-03 ENCOUNTER — Encounter: Admitting: Sleep Medicine

## 2025-02-16 ENCOUNTER — Ambulatory Visit

## 2025-02-20 ENCOUNTER — Ambulatory Visit
